# Patient Record
Sex: Male | Born: 1975 | Race: White | Hispanic: No | Marital: Single | Smoking: Current every day smoker
Health system: Southern US, Community
[De-identification: ages and names within clinical notes are randomized; demographics above are authoritative.]

## PROBLEM LIST (undated history)

## (undated) DIAGNOSIS — F1120 Opioid dependence, uncomplicated: Secondary | ICD-10-CM

## (undated) DIAGNOSIS — F988 Other specified behavioral and emotional disorders with onset usually occurring in childhood and adolescence: Secondary | ICD-10-CM

## (undated) DIAGNOSIS — F319 Bipolar disorder, unspecified: Secondary | ICD-10-CM

## (undated) DIAGNOSIS — I10 Essential (primary) hypertension: Secondary | ICD-10-CM

## (undated) DIAGNOSIS — M549 Dorsalgia, unspecified: Secondary | ICD-10-CM

## (undated) DIAGNOSIS — F1190 Opioid use, unspecified, uncomplicated: Secondary | ICD-10-CM

## (undated) DIAGNOSIS — G8929 Other chronic pain: Secondary | ICD-10-CM

## (undated) DIAGNOSIS — F419 Anxiety disorder, unspecified: Secondary | ICD-10-CM

## (undated) DIAGNOSIS — G4730 Sleep apnea, unspecified: Secondary | ICD-10-CM

## (undated) DIAGNOSIS — N200 Calculus of kidney: Secondary | ICD-10-CM

## (undated) HISTORY — PX: BACK SURGERY: SHX140

## (undated) HISTORY — PX: HIP SURGERY: SHX245

## (undated) HISTORY — PX: HAND SURGERY: SHX662

## (undated) HISTORY — PX: OTHER SURGICAL HISTORY: SHX169

---

## 1998-05-12 ENCOUNTER — Emergency Department (HOSPITAL_COMMUNITY): Admission: EM | Admit: 1998-05-12 | Discharge: 1998-05-12 | Payer: Self-pay | Admitting: Emergency Medicine

## 1999-01-19 ENCOUNTER — Emergency Department (HOSPITAL_COMMUNITY): Admission: EM | Admit: 1999-01-19 | Discharge: 1999-01-19 | Payer: Self-pay

## 1999-01-28 ENCOUNTER — Emergency Department (HOSPITAL_COMMUNITY): Admission: EM | Admit: 1999-01-28 | Discharge: 1999-01-28 | Payer: Self-pay | Admitting: Emergency Medicine

## 1999-09-25 ENCOUNTER — Emergency Department (HOSPITAL_COMMUNITY): Admission: EM | Admit: 1999-09-25 | Discharge: 1999-09-26 | Payer: Self-pay | Admitting: Emergency Medicine

## 1999-09-26 ENCOUNTER — Encounter: Payer: Self-pay | Admitting: Emergency Medicine

## 1999-10-22 ENCOUNTER — Emergency Department (HOSPITAL_COMMUNITY): Admission: EM | Admit: 1999-10-22 | Discharge: 1999-10-22 | Payer: Self-pay | Admitting: Emergency Medicine

## 1999-12-27 ENCOUNTER — Emergency Department (HOSPITAL_COMMUNITY): Admission: EM | Admit: 1999-12-27 | Discharge: 1999-12-27 | Payer: Self-pay | Admitting: Emergency Medicine

## 2000-01-30 ENCOUNTER — Emergency Department (HOSPITAL_COMMUNITY): Admission: EM | Admit: 2000-01-30 | Discharge: 2000-01-30 | Payer: Self-pay | Admitting: *Deleted

## 2000-05-21 ENCOUNTER — Emergency Department (HOSPITAL_COMMUNITY): Admission: EM | Admit: 2000-05-21 | Discharge: 2000-05-21 | Payer: Self-pay | Admitting: Emergency Medicine

## 2000-12-10 HISTORY — PX: OTHER SURGICAL HISTORY: SHX169

## 2001-01-19 ENCOUNTER — Emergency Department (HOSPITAL_COMMUNITY): Admission: EM | Admit: 2001-01-19 | Discharge: 2001-01-19 | Payer: Self-pay | Admitting: *Deleted

## 2001-06-07 ENCOUNTER — Emergency Department (HOSPITAL_COMMUNITY): Admission: EM | Admit: 2001-06-07 | Discharge: 2001-06-07 | Payer: Self-pay | Admitting: Emergency Medicine

## 2001-06-07 ENCOUNTER — Encounter: Payer: Self-pay | Admitting: Emergency Medicine

## 2001-07-18 ENCOUNTER — Encounter: Payer: Self-pay | Admitting: Emergency Medicine

## 2001-07-18 ENCOUNTER — Emergency Department (HOSPITAL_COMMUNITY): Admission: EM | Admit: 2001-07-18 | Discharge: 2001-07-18 | Payer: Self-pay | Admitting: Emergency Medicine

## 2001-10-26 ENCOUNTER — Emergency Department (HOSPITAL_COMMUNITY): Admission: EM | Admit: 2001-10-26 | Discharge: 2001-10-26 | Payer: Self-pay | Admitting: Emergency Medicine

## 2001-10-26 ENCOUNTER — Encounter: Payer: Self-pay | Admitting: Emergency Medicine

## 2001-10-28 ENCOUNTER — Emergency Department (HOSPITAL_COMMUNITY): Admission: EM | Admit: 2001-10-28 | Discharge: 2001-10-28 | Payer: Self-pay | Admitting: Emergency Medicine

## 2001-11-14 ENCOUNTER — Encounter: Payer: Self-pay | Admitting: Emergency Medicine

## 2001-11-14 ENCOUNTER — Emergency Department (HOSPITAL_COMMUNITY): Admission: EM | Admit: 2001-11-14 | Discharge: 2001-11-14 | Payer: Self-pay | Admitting: Emergency Medicine

## 2002-01-14 ENCOUNTER — Emergency Department (HOSPITAL_COMMUNITY): Admission: EM | Admit: 2002-01-14 | Discharge: 2002-01-14 | Payer: Self-pay | Admitting: Emergency Medicine

## 2002-09-29 ENCOUNTER — Ambulatory Visit (HOSPITAL_COMMUNITY): Admission: RE | Admit: 2002-09-29 | Discharge: 2002-09-29 | Payer: Self-pay | Admitting: Orthopedic Surgery

## 2002-09-29 ENCOUNTER — Encounter: Payer: Self-pay | Admitting: Orthopedic Surgery

## 2003-05-06 ENCOUNTER — Emergency Department (HOSPITAL_COMMUNITY): Admission: EM | Admit: 2003-05-06 | Discharge: 2003-05-06 | Payer: Self-pay | Admitting: Emergency Medicine

## 2003-05-07 ENCOUNTER — Emergency Department (HOSPITAL_COMMUNITY): Admission: EM | Admit: 2003-05-07 | Discharge: 2003-05-07 | Payer: Self-pay | Admitting: Emergency Medicine

## 2003-05-07 ENCOUNTER — Encounter: Payer: Self-pay | Admitting: Emergency Medicine

## 2003-05-09 ENCOUNTER — Emergency Department (HOSPITAL_COMMUNITY): Admission: EM | Admit: 2003-05-09 | Discharge: 2003-05-09 | Payer: Self-pay | Admitting: Emergency Medicine

## 2003-06-01 ENCOUNTER — Encounter: Payer: Self-pay | Admitting: Emergency Medicine

## 2003-06-01 ENCOUNTER — Emergency Department (HOSPITAL_COMMUNITY): Admission: EM | Admit: 2003-06-01 | Discharge: 2003-06-01 | Payer: Self-pay | Admitting: Emergency Medicine

## 2003-06-02 ENCOUNTER — Encounter: Payer: Self-pay | Admitting: Emergency Medicine

## 2003-06-02 ENCOUNTER — Inpatient Hospital Stay (HOSPITAL_COMMUNITY): Admission: EM | Admit: 2003-06-02 | Discharge: 2003-06-02 | Payer: Self-pay | Admitting: Emergency Medicine

## 2003-06-02 ENCOUNTER — Encounter: Payer: Self-pay | Admitting: Neurology

## 2003-09-21 ENCOUNTER — Encounter: Payer: Self-pay | Admitting: Emergency Medicine

## 2003-09-21 ENCOUNTER — Emergency Department (HOSPITAL_COMMUNITY): Admission: EM | Admit: 2003-09-21 | Discharge: 2003-09-21 | Payer: Self-pay | Admitting: Emergency Medicine

## 2003-10-12 ENCOUNTER — Emergency Department (HOSPITAL_COMMUNITY): Admission: EM | Admit: 2003-10-12 | Discharge: 2003-10-13 | Payer: Self-pay | Admitting: Emergency Medicine

## 2003-11-21 ENCOUNTER — Inpatient Hospital Stay (HOSPITAL_COMMUNITY): Admission: EM | Admit: 2003-11-21 | Discharge: 2003-11-22 | Payer: Self-pay | Admitting: Emergency Medicine

## 2004-01-16 ENCOUNTER — Emergency Department (HOSPITAL_COMMUNITY): Admission: EM | Admit: 2004-01-16 | Discharge: 2004-01-17 | Payer: Self-pay | Admitting: Emergency Medicine

## 2004-02-18 ENCOUNTER — Emergency Department (HOSPITAL_COMMUNITY): Admission: EM | Admit: 2004-02-18 | Discharge: 2004-02-19 | Payer: Self-pay | Admitting: Emergency Medicine

## 2004-02-26 ENCOUNTER — Emergency Department (HOSPITAL_COMMUNITY): Admission: EM | Admit: 2004-02-26 | Discharge: 2004-02-26 | Payer: Self-pay | Admitting: Emergency Medicine

## 2004-03-03 ENCOUNTER — Emergency Department (HOSPITAL_COMMUNITY): Admission: EM | Admit: 2004-03-03 | Discharge: 2004-03-03 | Payer: Self-pay | Admitting: Emergency Medicine

## 2004-03-04 ENCOUNTER — Emergency Department (HOSPITAL_COMMUNITY): Admission: EM | Admit: 2004-03-04 | Discharge: 2004-03-05 | Payer: Self-pay | Admitting: Emergency Medicine

## 2004-03-06 ENCOUNTER — Emergency Department (HOSPITAL_COMMUNITY): Admission: EM | Admit: 2004-03-06 | Discharge: 2004-03-07 | Payer: Self-pay | Admitting: Emergency Medicine

## 2004-03-09 ENCOUNTER — Ambulatory Visit (HOSPITAL_COMMUNITY): Admission: RE | Admit: 2004-03-09 | Discharge: 2004-03-09 | Payer: Self-pay | Admitting: Urology

## 2004-03-15 ENCOUNTER — Ambulatory Visit (HOSPITAL_COMMUNITY): Admission: RE | Admit: 2004-03-15 | Discharge: 2004-03-15 | Payer: Self-pay | Admitting: Urology

## 2004-03-25 ENCOUNTER — Emergency Department (HOSPITAL_COMMUNITY): Admission: EM | Admit: 2004-03-25 | Discharge: 2004-03-25 | Payer: Self-pay | Admitting: Emergency Medicine

## 2004-03-28 ENCOUNTER — Emergency Department (HOSPITAL_COMMUNITY): Admission: EM | Admit: 2004-03-28 | Discharge: 2004-03-28 | Payer: Self-pay | Admitting: Emergency Medicine

## 2004-04-22 ENCOUNTER — Emergency Department (HOSPITAL_COMMUNITY): Admission: EM | Admit: 2004-04-22 | Discharge: 2004-04-22 | Payer: Self-pay | Admitting: Emergency Medicine

## 2004-04-25 ENCOUNTER — Emergency Department (HOSPITAL_COMMUNITY): Admission: EM | Admit: 2004-04-25 | Discharge: 2004-04-25 | Payer: Self-pay | Admitting: Emergency Medicine

## 2004-04-30 ENCOUNTER — Emergency Department (HOSPITAL_COMMUNITY): Admission: EM | Admit: 2004-04-30 | Discharge: 2004-04-30 | Payer: Self-pay | Admitting: Emergency Medicine

## 2004-05-25 ENCOUNTER — Emergency Department (HOSPITAL_COMMUNITY): Admission: EM | Admit: 2004-05-25 | Discharge: 2004-05-25 | Payer: Self-pay | Admitting: Emergency Medicine

## 2004-06-01 ENCOUNTER — Encounter: Admission: RE | Admit: 2004-06-01 | Discharge: 2004-06-01 | Payer: Self-pay | Admitting: Orthopaedic Surgery

## 2004-07-13 ENCOUNTER — Inpatient Hospital Stay (HOSPITAL_COMMUNITY): Admission: RE | Admit: 2004-07-13 | Discharge: 2004-07-19 | Payer: Self-pay | Admitting: Orthopaedic Surgery

## 2004-07-23 ENCOUNTER — Emergency Department (HOSPITAL_COMMUNITY): Admission: EM | Admit: 2004-07-23 | Discharge: 2004-07-23 | Payer: Self-pay

## 2004-07-23 ENCOUNTER — Emergency Department (HOSPITAL_COMMUNITY): Admission: EM | Admit: 2004-07-23 | Discharge: 2004-07-23 | Payer: Self-pay | Admitting: Emergency Medicine

## 2004-08-09 ENCOUNTER — Emergency Department (HOSPITAL_COMMUNITY): Admission: EM | Admit: 2004-08-09 | Discharge: 2004-08-09 | Payer: Self-pay | Admitting: Emergency Medicine

## 2004-09-21 ENCOUNTER — Inpatient Hospital Stay (HOSPITAL_COMMUNITY): Admission: EM | Admit: 2004-09-21 | Discharge: 2004-09-22 | Payer: Self-pay | Admitting: Emergency Medicine

## 2004-09-25 ENCOUNTER — Emergency Department: Payer: Self-pay | Admitting: General Practice

## 2004-10-05 ENCOUNTER — Encounter: Admission: RE | Admit: 2004-10-05 | Discharge: 2004-10-05 | Payer: Self-pay | Admitting: Thoracic Surgery

## 2004-10-21 ENCOUNTER — Emergency Department (HOSPITAL_COMMUNITY): Admission: EM | Admit: 2004-10-21 | Discharge: 2004-10-22 | Payer: Self-pay | Admitting: Family Medicine

## 2004-10-22 ENCOUNTER — Emergency Department (HOSPITAL_COMMUNITY): Admission: EM | Admit: 2004-10-22 | Discharge: 2004-10-22 | Payer: Self-pay | Admitting: Family Medicine

## 2004-11-02 ENCOUNTER — Emergency Department (HOSPITAL_COMMUNITY): Admission: EM | Admit: 2004-11-02 | Discharge: 2004-11-02 | Payer: Self-pay | Admitting: Emergency Medicine

## 2004-11-06 ENCOUNTER — Emergency Department (HOSPITAL_COMMUNITY): Admission: EM | Admit: 2004-11-06 | Discharge: 2004-11-06 | Payer: Self-pay | Admitting: Emergency Medicine

## 2004-11-06 ENCOUNTER — Emergency Department: Payer: Self-pay | Admitting: Emergency Medicine

## 2004-11-07 ENCOUNTER — Emergency Department: Payer: Self-pay | Admitting: Unknown Physician Specialty

## 2004-11-20 ENCOUNTER — Emergency Department (HOSPITAL_COMMUNITY): Admission: EM | Admit: 2004-11-20 | Discharge: 2004-11-20 | Payer: Self-pay | Admitting: Emergency Medicine

## 2004-12-16 ENCOUNTER — Emergency Department (HOSPITAL_COMMUNITY): Admission: EM | Admit: 2004-12-16 | Discharge: 2004-12-16 | Payer: Self-pay | Admitting: Emergency Medicine

## 2004-12-20 ENCOUNTER — Emergency Department: Payer: Self-pay | Admitting: General Practice

## 2005-01-08 ENCOUNTER — Ambulatory Visit (HOSPITAL_COMMUNITY): Admission: RE | Admit: 2005-01-08 | Discharge: 2005-01-08 | Payer: Self-pay | Admitting: Family Medicine

## 2005-01-08 ENCOUNTER — Emergency Department (HOSPITAL_COMMUNITY): Admission: EM | Admit: 2005-01-08 | Discharge: 2005-01-08 | Payer: Self-pay | Admitting: Family Medicine

## 2005-01-16 ENCOUNTER — Emergency Department (HOSPITAL_COMMUNITY): Admission: EM | Admit: 2005-01-16 | Discharge: 2005-01-16 | Payer: Self-pay | Admitting: Emergency Medicine

## 2005-02-05 ENCOUNTER — Emergency Department (HOSPITAL_COMMUNITY): Admission: EM | Admit: 2005-02-05 | Discharge: 2005-02-05 | Payer: Self-pay | Admitting: Emergency Medicine

## 2005-02-16 ENCOUNTER — Encounter: Admission: RE | Admit: 2005-02-16 | Discharge: 2005-05-08 | Payer: Self-pay | Admitting: Anesthesiology

## 2005-02-20 ENCOUNTER — Ambulatory Visit: Payer: Self-pay | Admitting: Anesthesiology

## 2005-02-22 ENCOUNTER — Emergency Department (HOSPITAL_COMMUNITY): Admission: EM | Admit: 2005-02-22 | Discharge: 2005-02-22 | Payer: Self-pay | Admitting: Family Medicine

## 2005-03-21 ENCOUNTER — Ambulatory Visit (HOSPITAL_BASED_OUTPATIENT_CLINIC_OR_DEPARTMENT_OTHER): Admission: RE | Admit: 2005-03-21 | Discharge: 2005-03-21 | Payer: Self-pay | Admitting: Orthopedic Surgery

## 2005-03-24 ENCOUNTER — Emergency Department: Payer: Self-pay | Admitting: Emergency Medicine

## 2005-04-03 ENCOUNTER — Emergency Department: Payer: Self-pay | Admitting: Emergency Medicine

## 2005-04-06 ENCOUNTER — Emergency Department: Payer: Self-pay | Admitting: Emergency Medicine

## 2005-05-08 ENCOUNTER — Encounter
Admission: RE | Admit: 2005-05-08 | Discharge: 2005-08-06 | Payer: Self-pay | Admitting: Physical Medicine and Rehabilitation

## 2005-05-09 ENCOUNTER — Ambulatory Visit: Payer: Self-pay | Admitting: Physical Medicine and Rehabilitation

## 2005-06-01 ENCOUNTER — Emergency Department: Payer: Self-pay | Admitting: Emergency Medicine

## 2005-06-10 ENCOUNTER — Emergency Department: Payer: Self-pay | Admitting: Emergency Medicine

## 2005-06-11 ENCOUNTER — Emergency Department: Payer: Self-pay | Admitting: Emergency Medicine

## 2005-06-12 ENCOUNTER — Emergency Department: Payer: Self-pay | Admitting: Unknown Physician Specialty

## 2005-06-13 ENCOUNTER — Inpatient Hospital Stay: Payer: Self-pay | Admitting: Internal Medicine

## 2005-06-21 ENCOUNTER — Emergency Department: Payer: Self-pay | Admitting: General Practice

## 2005-07-05 ENCOUNTER — Emergency Department (HOSPITAL_COMMUNITY): Admission: EM | Admit: 2005-07-05 | Discharge: 2005-07-05 | Payer: Self-pay | Admitting: Emergency Medicine

## 2005-09-20 ENCOUNTER — Ambulatory Visit: Payer: Self-pay | Admitting: Nurse Practitioner

## 2005-09-20 ENCOUNTER — Encounter (INDEPENDENT_AMBULATORY_CARE_PROVIDER_SITE_OTHER): Payer: Self-pay | Admitting: *Deleted

## 2005-09-20 ENCOUNTER — Other Ambulatory Visit: Admission: RE | Admit: 2005-09-20 | Discharge: 2005-09-20 | Payer: Self-pay | Admitting: Nurse Practitioner

## 2005-09-24 ENCOUNTER — Ambulatory Visit: Payer: Self-pay | Admitting: Nurse Practitioner

## 2005-10-04 ENCOUNTER — Ambulatory Visit: Payer: Self-pay | Admitting: Nurse Practitioner

## 2005-10-04 ENCOUNTER — Ambulatory Visit (HOSPITAL_COMMUNITY): Admission: RE | Admit: 2005-10-04 | Discharge: 2005-10-04 | Payer: Self-pay | Admitting: Internal Medicine

## 2005-10-06 ENCOUNTER — Emergency Department: Payer: Self-pay | Admitting: Emergency Medicine

## 2005-11-03 ENCOUNTER — Emergency Department: Payer: Self-pay | Admitting: Internal Medicine

## 2005-11-10 ENCOUNTER — Emergency Department: Payer: Self-pay | Admitting: Emergency Medicine

## 2005-11-15 ENCOUNTER — Emergency Department (HOSPITAL_COMMUNITY): Admission: EM | Admit: 2005-11-15 | Discharge: 2005-11-15 | Payer: Self-pay | Admitting: Emergency Medicine

## 2005-11-18 ENCOUNTER — Emergency Department (HOSPITAL_COMMUNITY): Admission: EM | Admit: 2005-11-18 | Discharge: 2005-11-18 | Payer: Self-pay | Admitting: Emergency Medicine

## 2005-11-21 ENCOUNTER — Emergency Department: Payer: Self-pay | Admitting: Emergency Medicine

## 2005-12-03 ENCOUNTER — Emergency Department (HOSPITAL_COMMUNITY): Admission: EM | Admit: 2005-12-03 | Discharge: 2005-12-03 | Payer: Self-pay | Admitting: Emergency Medicine

## 2005-12-15 ENCOUNTER — Emergency Department: Payer: Self-pay | Admitting: Emergency Medicine

## 2005-12-16 ENCOUNTER — Emergency Department: Payer: Self-pay | Admitting: Emergency Medicine

## 2005-12-16 ENCOUNTER — Emergency Department (HOSPITAL_COMMUNITY): Admission: EM | Admit: 2005-12-16 | Discharge: 2005-12-16 | Payer: Self-pay | Admitting: Emergency Medicine

## 2005-12-17 ENCOUNTER — Ambulatory Visit: Payer: Self-pay | Admitting: Nurse Practitioner

## 2005-12-21 ENCOUNTER — Emergency Department: Payer: Self-pay | Admitting: Emergency Medicine

## 2005-12-24 ENCOUNTER — Ambulatory Visit: Payer: Self-pay | Admitting: Nurse Practitioner

## 2005-12-31 ENCOUNTER — Emergency Department (HOSPITAL_COMMUNITY): Admission: EM | Admit: 2005-12-31 | Discharge: 2005-12-31 | Payer: Self-pay | Admitting: Emergency Medicine

## 2005-12-31 ENCOUNTER — Ambulatory Visit (HOSPITAL_COMMUNITY): Admission: RE | Admit: 2005-12-31 | Discharge: 2005-12-31 | Payer: Self-pay | Admitting: Internal Medicine

## 2006-01-02 ENCOUNTER — Ambulatory Visit: Payer: Self-pay | Admitting: Nurse Practitioner

## 2006-01-14 ENCOUNTER — Emergency Department: Payer: Self-pay | Admitting: Unknown Physician Specialty

## 2006-01-21 ENCOUNTER — Ambulatory Visit: Payer: Self-pay | Admitting: Nurse Practitioner

## 2006-01-25 ENCOUNTER — Ambulatory Visit: Payer: Self-pay | Admitting: Nurse Practitioner

## 2006-02-12 ENCOUNTER — Emergency Department: Payer: Self-pay | Admitting: Emergency Medicine

## 2006-02-13 ENCOUNTER — Emergency Department (HOSPITAL_COMMUNITY): Admission: EM | Admit: 2006-02-13 | Discharge: 2006-02-13 | Payer: Self-pay | Admitting: Emergency Medicine

## 2006-02-14 ENCOUNTER — Emergency Department: Payer: Self-pay | Admitting: Emergency Medicine

## 2006-02-27 ENCOUNTER — Ambulatory Visit: Payer: Self-pay | Admitting: Nurse Practitioner

## 2006-03-07 ENCOUNTER — Ambulatory Visit: Payer: Self-pay | Admitting: Nurse Practitioner

## 2006-05-22 ENCOUNTER — Emergency Department: Payer: Self-pay | Admitting: General Practice

## 2006-06-03 ENCOUNTER — Emergency Department: Payer: Self-pay | Admitting: Emergency Medicine

## 2006-08-13 ENCOUNTER — Emergency Department: Payer: Self-pay | Admitting: Emergency Medicine

## 2006-08-13 ENCOUNTER — Other Ambulatory Visit: Payer: Self-pay

## 2006-09-16 ENCOUNTER — Emergency Department (HOSPITAL_COMMUNITY): Admission: EM | Admit: 2006-09-16 | Discharge: 2006-09-16 | Payer: Self-pay | Admitting: Emergency Medicine

## 2006-11-27 ENCOUNTER — Emergency Department (HOSPITAL_COMMUNITY): Admission: EM | Admit: 2006-11-27 | Discharge: 2006-11-27 | Payer: Self-pay | Admitting: Emergency Medicine

## 2008-04-03 ENCOUNTER — Encounter: Admission: RE | Admit: 2008-04-03 | Discharge: 2008-04-03 | Payer: Self-pay | Admitting: Orthopaedic Surgery

## 2008-08-07 ENCOUNTER — Emergency Department (HOSPITAL_COMMUNITY): Admission: EM | Admit: 2008-08-07 | Discharge: 2008-08-07 | Payer: Self-pay | Admitting: Emergency Medicine

## 2008-08-23 ENCOUNTER — Encounter: Admission: RE | Admit: 2008-08-23 | Discharge: 2008-08-23 | Payer: Self-pay | Admitting: Orthopaedic Surgery

## 2009-04-12 ENCOUNTER — Encounter: Admission: RE | Admit: 2009-04-12 | Discharge: 2009-04-12 | Payer: Self-pay | Admitting: Orthopaedic Surgery

## 2010-01-23 ENCOUNTER — Encounter: Admission: RE | Admit: 2010-01-23 | Discharge: 2010-01-23 | Payer: Self-pay | Admitting: Orthopaedic Surgery

## 2010-05-31 ENCOUNTER — Encounter: Admission: RE | Admit: 2010-05-31 | Discharge: 2010-05-31 | Payer: Self-pay | Admitting: Orthopaedic Surgery

## 2010-09-28 ENCOUNTER — Emergency Department (HOSPITAL_COMMUNITY): Admission: EM | Admit: 2010-09-28 | Discharge: 2010-09-28 | Payer: Self-pay | Admitting: Emergency Medicine

## 2010-10-08 ENCOUNTER — Emergency Department (HOSPITAL_COMMUNITY): Admission: EM | Admit: 2010-10-08 | Discharge: 2010-10-08 | Payer: Self-pay | Admitting: Emergency Medicine

## 2010-12-30 ENCOUNTER — Encounter: Payer: Self-pay | Admitting: Thoracic Surgery

## 2010-12-30 ENCOUNTER — Encounter: Payer: Self-pay | Admitting: Family Medicine

## 2010-12-30 ENCOUNTER — Encounter: Payer: Self-pay | Admitting: Specialist

## 2010-12-31 ENCOUNTER — Encounter: Payer: Self-pay | Admitting: Orthopaedic Surgery

## 2011-01-01 ENCOUNTER — Encounter: Payer: Self-pay | Admitting: Orthopaedic Surgery

## 2011-02-21 LAB — URINALYSIS, ROUTINE W REFLEX MICROSCOPIC
Nitrite: NEGATIVE
Protein, ur: 30 mg/dL — AB
Urobilinogen, UA: 0.2 mg/dL (ref 0.0–1.0)

## 2011-02-21 LAB — URINE MICROSCOPIC-ADD ON

## 2011-02-21 LAB — POCT I-STAT, CHEM 8
BUN: 11 mg/dL (ref 6–23)
Chloride: 104 mEq/L (ref 96–112)
Sodium: 139 mEq/L (ref 135–145)

## 2011-03-03 ENCOUNTER — Emergency Department (HOSPITAL_COMMUNITY)
Admission: EM | Admit: 2011-03-03 | Discharge: 2011-03-03 | Disposition: A | Discharge status: Home / Self Care | Payer: Medicare Other | Attending: Emergency Medicine | Admitting: Emergency Medicine

## 2011-03-03 ENCOUNTER — Emergency Department (HOSPITAL_COMMUNITY): Payer: Medicare Other

## 2011-03-03 DIAGNOSIS — R0602 Shortness of breath: Secondary | ICD-10-CM | POA: Insufficient documentation

## 2011-03-03 DIAGNOSIS — IMO0002 Reserved for concepts with insufficient information to code with codable children: Secondary | ICD-10-CM | POA: Insufficient documentation

## 2011-03-03 DIAGNOSIS — R071 Chest pain on breathing: Secondary | ICD-10-CM | POA: Insufficient documentation

## 2011-03-03 DIAGNOSIS — G8929 Other chronic pain: Secondary | ICD-10-CM | POA: Insufficient documentation

## 2011-03-03 LAB — POCT CARDIAC MARKERS
CKMB, poc: <1 ng/mL — ABNORMAL LOW (ref 1.0–8.0)
Myoglobin, poc: 67.3 ng/mL (ref 12–200)
Troponin i, poc: <0.05 ng/mL (ref 0.00–0.09)

## 2011-03-03 LAB — POCT I-STAT, CHEM 8
BUN: 11 mg/dL (ref 6–23)
Creatinine, Ser: 1 mg/dL (ref 0.4–1.5)
Potassium: 4.1 mEq/L (ref 3.5–5.1)
Sodium: 140 mEq/L (ref 135–145)

## 2011-03-03 MED ORDER — IOHEXOL 300 MG/ML  SOLN
100.0000 mL | Freq: Once | INTRAMUSCULAR | Status: AC | PRN
  Administered 2011-03-03: 100 mL via INTRAVENOUS

## 2011-04-08 ENCOUNTER — Emergency Department (HOSPITAL_COMMUNITY): Payer: Medicare Other

## 2011-04-08 ENCOUNTER — Emergency Department (HOSPITAL_COMMUNITY)
Admission: EM | Admit: 2011-04-08 | Discharge: 2011-04-08 | Disposition: A | Discharge status: Home / Self Care | Payer: Medicare Other | Attending: Emergency Medicine | Admitting: Emergency Medicine

## 2011-04-08 DIAGNOSIS — Z87442 Personal history of urinary calculi: Secondary | ICD-10-CM | POA: Insufficient documentation

## 2011-04-08 DIAGNOSIS — R319 Hematuria, unspecified: Secondary | ICD-10-CM | POA: Insufficient documentation

## 2011-04-08 DIAGNOSIS — R109 Unspecified abdominal pain: Secondary | ICD-10-CM | POA: Insufficient documentation

## 2011-04-08 DIAGNOSIS — IMO0002 Reserved for concepts with insufficient information to code with codable children: Secondary | ICD-10-CM | POA: Insufficient documentation

## 2011-04-08 LAB — DIFFERENTIAL
Basophils Absolute: 0 10*3/uL (ref 0.0–0.1)
Basophils Relative: 0 % (ref 0–1)
Monocytes Absolute: 0.9 10*3/uL (ref 0.1–1.0)
Neutro Abs: 6.6 10*3/uL (ref 1.7–7.7)
Neutrophils Relative %: 66 % (ref 43–77)

## 2011-04-08 LAB — POCT I-STAT, CHEM 8
HCT: 49 % (ref 39.0–52.0)
Hemoglobin: 16.7 g/dL (ref 13.0–17.0)
Sodium: 140 mEq/L (ref 135–145)
TCO2: 28 mmol/L (ref 0–100)

## 2011-04-08 LAB — CBC
Hemoglobin: 15.1 g/dL (ref 13.0–17.0)
MCHC: 33.8 g/dL (ref 30.0–36.0)
RBC: 5.11 MIL/uL (ref 4.22–5.81)

## 2011-04-08 LAB — URINALYSIS, ROUTINE W REFLEX MICROSCOPIC
Glucose, UA: NEGATIVE mg/dL
Leukocytes, UA: NEGATIVE
Protein, ur: NEGATIVE mg/dL

## 2011-04-08 LAB — URINE MICROSCOPIC-ADD ON
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
## 2011-04-27 NOTE — Op Note (Signed)
NAMEAXIL, COPEMAN              ACCOUNT NO.:  [id number]   MEDICAL RECORD NO.:  [id number]          PATIENT TYPE:  AMB   LOCATION:  DSC                          FACILITY:  [facility]   PHYSICIAN:  Cindee Salt, M.D.       DATE OF BIRTH:  18-Jan-1976   DATE OF PROCEDURE:  03/21/2005  DATE OF DISCHARGE:                                 OPERATIVE REPORT   PREOPERATIVE DIAGNOSIS:  Degenerative arthritis, metacarpophalangeal joint,  status post fracture, left thumb.   POSTOPERATIVE DIAGNOSIS:  Degenerative arthritis, metacarpophalangeal joint,  status post fracture, left thumb.   OPERATION:  Fusion, metacarpophalangeal joint, left thumb.   SURGEON:  Cindee Salt, M.D.   ASSISTANT:  Carolyne Fiscal R.N.   ANESTHESIA:  General.   HISTORY:  The patient is 35 year old male with a history of injury to the  metacarpophalangeal joint of his thumb.  He was treated conservatively.  He has had continued pain.  Bone scan shows  degenerative changes.  He has not responded to conservative treatment.   PROCEDURE:  The patient is brought to the operating room, where a general  anesthetic was carried out without difficulty.  He was prepped using  DuraPrep, supine position, the left arm free.  The limb was exsanguinated  with an Esmarch bandage, tourniquet placed on the arm was inflated to 250  mmHg.  A longitudinal incision was made over the metacarpophalangeal joint,  carried down through subcutaneous tissue.  Bleeders were electrocauterized.  The extensor tendons were split between the extensor pollicis longus,  extensor pollicis brevis, the joint capsule opened.  The joint was  inspected.  Degenerative changes were present with total loss of cartilage  in three areas on the joint, both on the metacarpal head and the proximal  portion of the proximal phalanx.  The collateral ligaments were then incised  at their insertion in the metacarpal head.  This allowed joint to be opened.  A rongeur was then used to  remove the cartilage and subchondral bone from  the metacarpal head.  Drill holes were placed for removal of the cartilage,  subchondral bone off from the proximal portion of the proximal phalanx with  a sharp osteotome.  These were connected.  A rongeur was then used to remove  this.  This was then shaped.  A guidewire was then placed for a mini-extract  screw.  This was inserted under direct vision through the metacarpal head  until was placed where adequate positioning was accounted for.  This was  then passed into the proximal phalanx.  This was found to have penetrated  the volar cortex.  This was slightly flexed to a greater position after  retrieval of the pin.  This was then driven and found to lie in the central  aspect of the middle phalanx on both AP and lateral directions.  A 24 mm  mini-extract screw was then placed, firmly compressing the  metacarpophalangeal joint.  The wound was irrigated.  X-rays confirmed  positioning of the extract screw in both AP and lateral directions.  The  capsule was closed with figure-of-eight 4-0 Vicryl, the  extensor tendon with  figure-of-eight 4-0 Vicryl, and skin with a subcuticular 4-0 Monocryl  suture.  Steri-Strips were applied, a sterile compressive dressing, a thumb  spica splint applied.  The patient tolerated the procedure well and was  taken to the recovery room for observation in satisfactory condition.  He is  discharged home to return to the [facility] in one week on  Percocet.      GK/MEDQ  D:  03/21/2005  T:  03/21/2005  Job:  [id number]

## 2011-04-27 NOTE — H&P (Signed)
NAME:  Jonathan Perez, Jonathan Perez                        ACCOUNT NO.:  [id number]   MEDICAL RECORD NO.:  [id number]                   PATIENT TYPE:  INP   LOCATION:  0456                                 FACILITY:  [facility]   PHYSICIAN:  Jene Every, M.D.                 DATE OF BIRTH:  Mar 28, 1976   DATE OF ADMISSION:  11/21/2003  DATE OF DISCHARGE:                                HISTORY & PHYSICAL   CHIEF COMPLAINT:  Low back pain.   HISTORY:  This is a 35 year old male who fell approximately 10 feet off a  ladder after trying to retrieve a cat and landed on his buttock.  He had  some reported acute back pain, no lower extremity radicular pain.  Presented  to the emergency room where he was found to have a compression fracture of  50% of the vertebral body.  He again reported no neck pain,  numbness/tingling currently in the upper or lower extremities.   PAST MEDICAL HISTORY:  Negative for peptic ulcer disease, diabetes, coronary  artery disease, hypertension.   ALLERGIES:  None.   MEDICATIONS:  None.   PHYSICAL EXAMINATION:  VITAL SIGNS:  He was afebrile with normotensive.  His  respirations were 20 and blood pressure 117/24.  GENERAL:  He is alert and oriented.  HEENT:  Atraumatic, normocephalic.  NECK:  Nontender to the cervical spine.  CHEST:  Nontender to palpation.  ABDOMEN:  Soft, nontender.  Positive bowel sounds.  BACK:  Nontender over the upper thoracic or lower lumbar spine.  PELVIS:  Stable to compression.  NEUROLOGIC:  Peripheral pulses are intact.  __________ V:  He is  neuroflexic.  Sensation is intact to light touch and position.  No  __________ or clonus.  No instability of the hips, knees, and ankles.  No  palpable tenderness in the upper or lower extremities.  RECTAL:  Good sphincter tone.  Good appreciation and sensation is intact.   AP/Lateral radiographs of the lumbar spine demonstrates T12 compression  fracture, no widening of the pedicles, 50% anterior wedge  compression  fracture, no apparent avulsion, no apparent retropulsion.   CT scan of the lumbar spine demonstrates no middle column involvement.  There is no burst fracture noted.   IMPRESSION:  T12 compression fracture anterior column.  No middle column  involvement.  Neurologically intact.  No evidence of ileus.   PLAN:  1. Admit for observation, neurologic examination.  Watch for ileus.  Pain     control.  2. Maintain in TLSO brace followed by a radiograph in this brace to rule out     vertebral fracture.                                               Jene Every, M.D.  JB/MEDQ  D:  11/22/2003  T:  11/22/2003  Job:  [id number]
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
## 2011-04-27 NOTE — Discharge Summary (Signed)
NAME:  [name], [name]                        ACCOUNT NO.:  [id number]   MEDICAL RECORD NO.:  [id number]                   PATIENT TYPE:  INP   LOCATION:  0456                                 FACILITY:  [facility]   PHYSICIAN:  Javier Docker, M.D.              DATE OF BIRTH:  [birth year]   DATE OF ADMISSION:  11/21/2003  DATE OF DISCHARGE:  11/22/2003                                 DISCHARGE SUMMARY   DISCHARGE DIAGNOSIS:  T12 compression fracture.   DISCHARGE DIAGNOSES:  1. T12 compression fracture.  2. Status post admission for observation.   PROCEDURE:  None.   CONSULTATIONS:  None.   PHYSICAL THERAPY/OCCUPATIONAL THERAPY:  [employer].   HISTORY:  [name] is a 35 year old gentleman.  He fell approximately 10  feet off a ladder trying to retrieve a cat.  The patient landed on his  buttocks and had acute onset of back pain.  Denies any pain in lower  extremities.  The patient then presented to the emergency room where x-rays  revealed compression fracture of 50% to the vertebral body.  He was admitted  for observation with preoperative neurologic examination and ileus  precaution as well as pain control.   LAN DATA:  On admission, CBC with white blood cell count 12.8, hemoglobin  17.6, hematocrit 49.9.  Discharge CBC had a white blood cell count of 8.7,  hemoglobin 17.3, hematocrit 49.2.  Routine chemistries prior to admission  showed sodium 138, potassium 4.2, glucose 90.  Urinalysis done showed large  amount of hemoglobin with 21-50 WBCs seen on hpf.   CT of the thoracic spine showed anterior wedge compression fracture of the  T12 vertebral body with approximately 50% loss of height.  No evidence of  burst fracture or impingement from the spine canal was noted.  Other films  taken in the emergency room of the sacrum and coccyx was negative.  The  pelvis no fractures were noted.  The lumbar spine no abnormalities were seen  on examination. Cervical spine films were  clear.   HOSPITAL COURSE:  The patient was admitted for observation, pain control as  well as periodic neurologic examination.  A CT scan was ordered to verify  there was no compression upon the thecal sac.  [employer] was consulted to  place the patient in a TLSO brace.  Ileus precautions were initiated.  On  admission day #2, the patient was doing well.  He noted mild pain. The pain  was not controlled on p.o. analgesics.  Vital signs were stable.  Neurovascular examination was intact.  He had passed flatus without  difficulty.  It was felt at this point the patient could be discharged home  with the TLSO brace with stool softener and p.o. pain medication.   DISPOSITION:  The patient was discharged home.   ACTIVITY:  The patient can ambulate as tolerated.  He should wear his TLSO  brace at all  times.  He should not bend, twist, or lift.   DISCHARGE MEDICATIONS:  Percocet as needed for pain.   DISCHARGE INSTRUCTIONS:  He was asked to call if he has any uncontrolled  pain, numbness or tingling in the legs or feet or urinary retention.  The  patient also was sent home with a stool softener.   DIET:  As tolerated to include fluid and fiber.   FOLLOWUP:  The patient should have followup with Dr. Shelle Iron in approximately  10-14 days for re-evaluation.   CONDITION ON DISCHARGE:  Stable.     Christine D. Iran Ouch, P.A.-C              Javier Docker, M.D.    CDS/MEDQ  D:  12/08/2003  T:  12/08/2003  Job:  [id number]
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
## 2011-06-06 ENCOUNTER — Emergency Department (HOSPITAL_COMMUNITY)
Admission: EM | Admit: 2011-06-06 | Discharge: 2011-06-07 | Disposition: A | Discharge status: Home / Self Care | Payer: Medicare Other | Attending: Emergency Medicine | Admitting: Emergency Medicine

## 2011-06-06 DIAGNOSIS — G43909 Migraine, unspecified, not intractable, without status migrainosus: Secondary | ICD-10-CM | POA: Insufficient documentation

## 2011-06-06 DIAGNOSIS — R110 Nausea: Secondary | ICD-10-CM | POA: Insufficient documentation

## 2011-06-06 DIAGNOSIS — H53149 Visual discomfort, unspecified: Secondary | ICD-10-CM | POA: Insufficient documentation

## 2011-06-06 DIAGNOSIS — IMO0002 Reserved for concepts with insufficient information to code with codable children: Secondary | ICD-10-CM | POA: Insufficient documentation

## 2011-07-03 ENCOUNTER — Emergency Department (HOSPITAL_COMMUNITY)
Admission: EM | Admit: 2011-07-03 | Discharge: 2011-07-03 | Disposition: A | Discharge status: Home / Self Care | Payer: Medicare Other | Attending: Emergency Medicine | Admitting: Emergency Medicine

## 2011-07-03 DIAGNOSIS — R109 Unspecified abdominal pain: Secondary | ICD-10-CM | POA: Insufficient documentation

## 2011-07-03 DIAGNOSIS — Z79899 Other long term (current) drug therapy: Secondary | ICD-10-CM | POA: Insufficient documentation

## 2011-07-03 DIAGNOSIS — R112 Nausea with vomiting, unspecified: Secondary | ICD-10-CM | POA: Insufficient documentation

## 2011-07-03 DIAGNOSIS — Z9889 Other specified postprocedural states: Secondary | ICD-10-CM | POA: Insufficient documentation

## 2011-07-03 DIAGNOSIS — IMO0002 Reserved for concepts with insufficient information to code with codable children: Secondary | ICD-10-CM | POA: Insufficient documentation

## 2011-07-03 LAB — URINALYSIS, ROUTINE W REFLEX MICROSCOPIC
Bilirubin Urine: NEGATIVE
Ketones, ur: NEGATIVE mg/dL
Nitrite: NEGATIVE
Protein, ur: NEGATIVE mg/dL
Specific Gravity, Urine: 1.02 (ref 1.005–1.030)
Urobilinogen, UA: 1 mg/dL (ref 0.0–1.0)

## 2011-07-03 LAB — RAPID URINE DRUG SCREEN, HOSP PERFORMED
Amphetamines: NOT DETECTED
Cocaine: NOT DETECTED
Opiates: POSITIVE — AB
Tetrahydrocannabinol: POSITIVE — AB

## 2011-07-22 ENCOUNTER — Emergency Department (HOSPITAL_COMMUNITY): Payer: Medicare Other

## 2011-07-22 ENCOUNTER — Emergency Department (HOSPITAL_COMMUNITY)
Admission: EM | Admit: 2011-07-22 | Discharge: 2011-07-22 | Disposition: A | Discharge status: Home / Self Care | Payer: Medicare Other | Attending: Emergency Medicine | Admitting: Emergency Medicine

## 2011-07-22 DIAGNOSIS — R319 Hematuria, unspecified: Secondary | ICD-10-CM | POA: Insufficient documentation

## 2011-07-22 DIAGNOSIS — R110 Nausea: Secondary | ICD-10-CM | POA: Insufficient documentation

## 2011-07-22 DIAGNOSIS — N200 Calculus of kidney: Secondary | ICD-10-CM | POA: Insufficient documentation

## 2011-07-22 DIAGNOSIS — Z87442 Personal history of urinary calculi: Secondary | ICD-10-CM | POA: Insufficient documentation

## 2011-07-22 DIAGNOSIS — N201 Calculus of ureter: Secondary | ICD-10-CM | POA: Insufficient documentation

## 2011-07-22 DIAGNOSIS — IMO0002 Reserved for concepts with insufficient information to code with codable children: Secondary | ICD-10-CM | POA: Insufficient documentation

## 2011-07-22 DIAGNOSIS — R109 Unspecified abdominal pain: Secondary | ICD-10-CM | POA: Insufficient documentation

## 2011-07-22 LAB — URINALYSIS, ROUTINE W REFLEX MICROSCOPIC
Bilirubin Urine: NEGATIVE
Glucose, UA: NEGATIVE mg/dL
Protein, ur: 30 mg/dL — AB
Specific Gravity, Urine: 1.026 (ref 1.005–1.030)
Urobilinogen, UA: 1 mg/dL (ref 0.0–1.0)

## 2011-07-22 LAB — POCT I-STAT, CHEM 8
Creatinine, Ser: 1 mg/dL (ref 0.50–1.35)
Hemoglobin: 15.6 g/dL (ref 13.0–17.0)
Potassium: 4.1 mEq/L (ref 3.5–5.1)
Sodium: 138 mEq/L (ref 135–145)

## 2011-07-22 LAB — URINE MICROSCOPIC-ADD ON

## 2011-09-20 ENCOUNTER — Emergency Department (HOSPITAL_COMMUNITY)
Admission: EM | Admit: 2011-09-20 | Discharge: 2011-09-20 | Disposition: A | Discharge status: Home / Self Care | Payer: Medicare Other | Attending: Emergency Medicine | Admitting: Emergency Medicine

## 2011-09-20 DIAGNOSIS — H9209 Otalgia, unspecified ear: Secondary | ICD-10-CM | POA: Insufficient documentation

## 2011-09-23 ENCOUNTER — Emergency Department (HOSPITAL_COMMUNITY): Payer: Medicare Other

## 2011-09-23 ENCOUNTER — Emergency Department (HOSPITAL_COMMUNITY)
Admission: EM | Admit: 2011-09-23 | Discharge: 2011-09-24 | Disposition: A | Discharge status: Home / Self Care | Payer: Medicare Other | Attending: Emergency Medicine | Admitting: Emergency Medicine

## 2011-09-23 DIAGNOSIS — IMO0002 Reserved for concepts with insufficient information to code with codable children: Secondary | ICD-10-CM | POA: Insufficient documentation

## 2011-09-23 DIAGNOSIS — N509 Disorder of male genital organs, unspecified: Secondary | ICD-10-CM | POA: Insufficient documentation

## 2011-09-23 DIAGNOSIS — Z79899 Other long term (current) drug therapy: Secondary | ICD-10-CM | POA: Insufficient documentation

## 2011-09-23 DIAGNOSIS — R110 Nausea: Secondary | ICD-10-CM | POA: Insufficient documentation

## 2011-09-23 DIAGNOSIS — R319 Hematuria, unspecified: Secondary | ICD-10-CM | POA: Insufficient documentation

## 2011-09-23 DIAGNOSIS — R1031 Right lower quadrant pain: Secondary | ICD-10-CM | POA: Insufficient documentation

## 2011-09-23 DIAGNOSIS — Z981 Arthrodesis status: Secondary | ICD-10-CM | POA: Insufficient documentation

## 2011-09-23 LAB — DIFFERENTIAL
Basophils Absolute: 0 10*3/uL (ref 0.0–0.1)
Basophils Relative: 0 % (ref 0–1)
Eosinophils Absolute: 0.1 10*3/uL (ref 0.0–0.7)
Eosinophils Relative: 1 % (ref 0–5)
Lymphocytes Relative: 18 % (ref 12–46)
Monocytes Absolute: 0.9 10*3/uL (ref 0.1–1.0)

## 2011-09-23 LAB — URINALYSIS, ROUTINE W REFLEX MICROSCOPIC
Leukocytes, UA: NEGATIVE
Nitrite: NEGATIVE
Protein, ur: NEGATIVE mg/dL
Specific Gravity, Urine: 1.026 (ref 1.005–1.030)
Urobilinogen, UA: 0.2 mg/dL (ref 0.0–1.0)

## 2011-09-23 LAB — COMPREHENSIVE METABOLIC PANEL
AST: 25 U/L (ref 0–37)
Albumin: 3.9 g/dL (ref 3.5–5.2)
Alkaline Phosphatase: 83 U/L (ref 39–117)
BUN: 13 mg/dL (ref 6–23)
Chloride: 100 mEq/L (ref 96–112)
Potassium: 4 mEq/L (ref 3.5–5.1)
Sodium: 137 mEq/L (ref 135–145)
Total Bilirubin: 0.5 mg/dL (ref 0.3–1.2)
Total Protein: 7.1 g/dL (ref 6.0–8.3)

## 2011-09-23 LAB — CBC
MCHC: 35 g/dL (ref 30.0–36.0)
Platelets: 272 10*3/uL (ref 150–400)
RDW: 14.1 % (ref 11.5–15.5)
WBC: 13.1 10*3/uL — ABNORMAL HIGH (ref 4.0–10.5)

## 2011-09-23 LAB — URINE MICROSCOPIC-ADD ON

## 2011-09-25 ENCOUNTER — Other Ambulatory Visit: Payer: Self-pay | Admitting: Otolaryngology

## 2011-09-25 DIAGNOSIS — H9209 Otalgia, unspecified ear: Secondary | ICD-10-CM

## 2011-10-03 ENCOUNTER — Ambulatory Visit
Admission: RE | Admit: 2011-10-03 | Discharge: 2011-10-03 | Disposition: A | Discharge status: Home / Self Care | Payer: Medicare Other | Source: Ambulatory Visit | Attending: Otolaryngology | Admitting: Otolaryngology

## 2011-10-03 DIAGNOSIS — H9209 Otalgia, unspecified ear: Secondary | ICD-10-CM

## 2011-12-17 ENCOUNTER — Emergency Department (HOSPITAL_COMMUNITY)
Admission: EM | Admit: 2011-12-17 | Discharge: 2011-12-18 | Disposition: A | Discharge status: Home / Self Care | Payer: Medicare Other | Attending: Emergency Medicine | Admitting: Emergency Medicine

## 2011-12-17 ENCOUNTER — Emergency Department (HOSPITAL_COMMUNITY): Payer: Medicare Other

## 2011-12-17 ENCOUNTER — Encounter: Payer: Self-pay | Admitting: *Deleted

## 2011-12-17 DIAGNOSIS — L98499 Non-pressure chronic ulcer of skin of other sites with unspecified severity: Secondary | ICD-10-CM | POA: Insufficient documentation

## 2011-12-17 DIAGNOSIS — M549 Dorsalgia, unspecified: Secondary | ICD-10-CM | POA: Insufficient documentation

## 2011-12-17 DIAGNOSIS — R51 Headache: Secondary | ICD-10-CM | POA: Insufficient documentation

## 2011-12-17 DIAGNOSIS — R112 Nausea with vomiting, unspecified: Secondary | ICD-10-CM

## 2011-12-17 DIAGNOSIS — R197 Diarrhea, unspecified: Secondary | ICD-10-CM | POA: Insufficient documentation

## 2011-12-17 DIAGNOSIS — R55 Syncope and collapse: Secondary | ICD-10-CM | POA: Insufficient documentation

## 2011-12-17 DIAGNOSIS — R05 Cough: Secondary | ICD-10-CM | POA: Insufficient documentation

## 2011-12-17 DIAGNOSIS — G8929 Other chronic pain: Secondary | ICD-10-CM | POA: Insufficient documentation

## 2011-12-17 DIAGNOSIS — R059 Cough, unspecified: Secondary | ICD-10-CM | POA: Insufficient documentation

## 2011-12-17 HISTORY — DX: Dorsalgia, unspecified: M54.9

## 2011-12-17 HISTORY — DX: Other chronic pain: G89.29

## 2011-12-17 MED ORDER — SODIUM CHLORIDE 0.9 % IV BOLUS (SEPSIS)
1000.0000 mL | Freq: Once | INTRAVENOUS | Status: AC
  Administered 2011-12-18: 1000 mL via INTRAVENOUS

## 2011-12-17 NOTE — ED Provider Notes (Signed)
History     CSN: [id number]  Arrival date & time 12/17/11  2212   First MD Initiated Contact with Patient 12/17/11 2334      Chief Complaint  Patient presents with  . Influenza    (Consider location/radiation/quality/duration/timing/severity/associated sxs/prior treatment) HPI Comments: Patient states that for the past, week.  He's been able to eat or drink anything, as he's had vomiting, diarrhea, cough, denies fever, nasal congestion, sore throat.  He does have a history of chronic back pain for which he takes Percocet and, Neurontin.  He's been unable to take this is a sensitive stomach.  He vomits.  Patient was attempting to change positions at triage and stood from wheelchair passed out falling to the floor, landing on his buttocks and then back.  He was immediately seen was awake, alert.  No loss of consciousness, although he appeared dazed questioning, where he was.  Patient is a 36 y.o. male presenting with flu symptoms. The history is provided by the patient.  Influenza This is a new problem. The current episode started in the past 7 days. The problem occurs constantly. The problem has been gradually worsening. Associated symptoms include coughing, headaches, nausea, vomiting and weakness. Pertinent negatives include no abdominal pain, chest pain, chills, congestion, diaphoresis, fever, numbness, sore throat or urinary symptoms. The symptoms are aggravated by nothing.    Past Medical History  Diagnosis Date  . Chronic back pain     History reviewed. No pertinent past surgical history.  History reviewed. No pertinent family history.  History  Substance Use Topics  . Smoking status: Not on file  . Smokeless tobacco: Not on file  . Alcohol Use:       Review of Systems  Constitutional: Positive for activity change and appetite change. Negative for fever, chills and diaphoresis.  HENT: Negative for congestion, sore throat and rhinorrhea.   Eyes: Negative for visual  disturbance.  Respiratory: Positive for cough and chest tightness. Negative for shortness of breath.   Cardiovascular: Negative for chest pain and leg swelling.  Gastrointestinal: Positive for nausea and vomiting. Negative for abdominal pain and constipation.  Genitourinary: Negative for dysuria and frequency.  Musculoskeletal: Positive for back pain.  Skin: Positive for pallor.  Neurological: Positive for dizziness, weakness and headaches. Negative for tremors, seizures and numbness.  Hematological: Negative.   Psychiatric/Behavioral: Negative.     Allergies  Review of patient's allergies indicates no known allergies.  Home Medications   Current Outpatient Rx  Name Route Sig Dispense Refill  . GABAPENTIN 400 MG PO CAPS Oral Take 400 mg by mouth 4 (four) times daily.      . OXYCODONE HCL 30 MG PO TABS Oral Take 30 mg by mouth every 4 (four) hours as needed.        BP 135/83  Pulse 82  Temp 98 F (36.7 C)  Resp 16  SpO2 98%  Physical Exam  Constitutional: He is oriented to person, place, and time. He appears well-developed and well-nourished.  HENT:  Head: Normocephalic and atraumatic.  Right Ear: External ear normal.  Left Ear: External ear normal.  Eyes: Pupils are equal, round, and reactive to light.  Neck: Normal range of motion.  Cardiovascular: Normal rate.   Pulmonary/Chest: Effort normal.  Abdominal: Soft. Bowel sounds are normal. He exhibits no distension. There is no tenderness.  Musculoskeletal: Normal range of motion. He exhibits no edema.  Neurological: He is alert and oriented to person, place, and time. No cranial nerve deficit.  Skin: Skin is warm and dry. There is pallor.  Psychiatric: He has a normal mood and affect.    ED Course  Procedures (including critical care time)   Labs Reviewed  CBC  DIFFERENTIAL  I-STAT, CHEM 8   No results found.   No diagnosis found.  ED ECG REPORT   Date: 12/18/2011  EKG Time: 12:56 AM  Rate: 55  Rhythm:  normal sinus rhythm,  PVC Borderline Q waves and flipped T wave in  Lead III  Axis: normal   Intervals:none  ST&T Change: none  Narrative Interpretation:none       We'll continue hydration as patient is still orthostatic by pulse     MDM  Will evaluate electrolytes, CBC, Will obtain EKG and head CT scan due to syncopal episode in the ED        Arman Filter, NP 12/17/11 2346  Arman Filter, NP 12/18/11 0058  Arman Filter, NP 12/18/11 0600

## 2011-12-17 NOTE — ED Notes (Signed)
Pt in c/o flu like symptoms since Thursday, cough, congestion, fever, n/v , back pain

## 2011-12-17 NOTE — ED Notes (Signed)
Xray bedside, pt refuses to awaken for xray, will attempt later

## 2011-12-17 NOTE — ED Notes (Signed)
Pt alert, nad, c/o "i dont know", "i never said i had the flu, you all did", " i cant eat, i cant drink", onset last week, resp even unlabored, skin pwd

## 2011-12-17 NOTE — ED Notes (Signed)
Pt requesting blanket and pillow to lay down, pt given blanket, pt decides to lay on floor, instructed pt not to lay on floor, pt states"i can lay where i want"

## 2011-12-18 ENCOUNTER — Other Ambulatory Visit: Payer: Self-pay

## 2011-12-18 LAB — POCT I-STAT, CHEM 8
BUN: 14 mg/dL (ref 6–23)
Calcium, Ion: 1.1 mmol/L — ABNORMAL LOW (ref 1.12–1.32)
Chloride: 105 mEq/L (ref 96–112)
Creatinine, Ser: 1 mg/dL (ref 0.50–1.35)
Glucose, Bld: 94 mg/dL (ref 70–99)
HCT: 51 % (ref 39.0–52.0)
Hemoglobin: 17.3 g/dL — ABNORMAL HIGH (ref 13.0–17.0)
Potassium: 3.6 mEq/L (ref 3.5–5.1)
Sodium: 140 mEq/L (ref 135–145)
TCO2: 25 mmol/L (ref 0–100)

## 2011-12-18 LAB — DIFFERENTIAL
Eosinophils Absolute: 0 10*3/uL (ref 0.0–0.7)
Eosinophils Relative: 0 % (ref 0–5)
Lymphocytes Relative: 19 % (ref 12–46)
Lymphs Abs: 2.5 10*3/uL (ref 0.7–4.0)
Monocytes Absolute: 1.3 10*3/uL — ABNORMAL HIGH (ref 0.1–1.0)

## 2011-12-18 LAB — CBC
HCT: 45.5 % (ref 39.0–52.0)
MCH: 31.3 pg (ref 26.0–34.0)
MCV: 88.9 fL (ref 78.0–100.0)
Platelets: 276 10*3/uL (ref 150–400)
RBC: 5.12 MIL/uL (ref 4.22–5.81)
RDW: 13.6 % (ref 11.5–15.5)
WBC: 13.2 10*3/uL — ABNORMAL HIGH (ref 4.0–10.5)

## 2011-12-18 MED ORDER — SODIUM CHLORIDE 0.9 % IV BOLUS (SEPSIS): 1000.0000 mL | Freq: Once | INTRAVENOUS | Status: DC

## 2011-12-18 MED ORDER — PROMETHAZINE HCL 25 MG RE SUPP
25.0000 mg | Freq: Once | RECTAL | Status: AC
  Administered 2011-12-18: 25 mg via RECTAL
  Filled 2011-12-18: qty 1

## 2011-12-18 MED ORDER — MORPHINE SULFATE 4 MG/ML IJ SOLN
4.0000 mg | Freq: Once | INTRAMUSCULAR | Status: AC
  Administered 2011-12-18: 4 mg via INTRAVENOUS
  Filled 2011-12-18: qty 1

## 2011-12-18 MED ORDER — SODIUM CHLORIDE 0.9 % IV BOLUS (SEPSIS)
1000.0000 mL | Freq: Once | INTRAVENOUS | Status: AC
  Administered 2011-12-18: 1000 mL via INTRAVENOUS

## 2011-12-18 MED ORDER — HYDROMORPHONE HCL PF 1 MG/ML IJ SOLN
1.0000 mg | Freq: Once | INTRAMUSCULAR | Status: AC
  Administered 2011-12-18: 1 mg via INTRAVENOUS
  Filled 2011-12-18: qty 1

## 2011-12-18 MED ORDER — ONDANSETRON HCL 4 MG PO TABS: 4.0000 mg | ORAL_TABLET | Freq: Four times a day (QID) | ORAL | Status: AC

## 2011-12-18 MED ORDER — OXYCODONE-ACETAMINOPHEN 5-325 MG PO TABS: 2.0000 | ORAL_TABLET | Freq: Once | ORAL | Status: DC

## 2011-12-18 MED ORDER — ONDANSETRON HCL 4 MG/2ML IJ SOLN
4.0000 mg | Freq: Once | INTRAMUSCULAR | Status: AC
  Administered 2011-12-18: 4 mg via INTRAVENOUS
  Filled 2011-12-18: qty 2

## 2011-12-18 NOTE — ED Provider Notes (Signed)
Medical screening examination/treatment/procedure(s) were conducted as a shared visit with non-physician practitioner(s) and myself.  I personally evaluated the patient during the encounter.  35yM with  N/v/d. Has been like this for several days. No fever or chills. No sick contacts. Syncopal event in triage. On exam laying in bed on side. NAD. Mild diffuse abdominal tenderness with no peritoneal signs. Heart rrr w/o m/r/g. Lungs clear. Skin warm and dry. Suspect viral illness or possible narcotic withdrawal. Low clinical suspicion for surgical abdomen. Plan IVF and symptomatic tx.  Raeford Razor, MD 12/18/11 [phone]

## 2011-12-18 NOTE — ED Provider Notes (Signed)
Patient report received from Jonathan Stain, NP.  35 year old male presenting the ED for evaluation of nausea, vomiting diarrhea. He was found to have orthostatic hypotension in the ED. He has been seen and evaluate. The goal is to rehydrate patient prior to discharge.  patient continues to endorse nausea, abdominal and back pain. On exam, patient appears in no acute distress. His abdomen is tender but no acute abdomen.  His vital signs improved. He is able to tolerate by mouth. I will continue to rehydrates patient and discharge as appropriate.    Fayrene Helper, PA-C 12/21/11 0725

## 2011-12-18 NOTE — ED Notes (Signed)
Pt called wife to come pick him up

## 2011-12-18 NOTE — ED Notes (Signed)
Pt napping quietly. VSS. No vomiting at this time. States "I feel terible"...

## 2011-12-18 NOTE — ED Notes (Signed)
Reviewed d/c instructions with pt,.Jonathan Perez

## 2011-12-23 NOTE — ED Provider Notes (Signed)
Medical screening examination/treatment/procedure(s) were performed by non-physician practitioner and as supervising physician I was immediately available for consultation/collaboration.  Raeford Razor, MD 12/23/11 [phone]

## 2012-04-21 ENCOUNTER — Emergency Department (HOSPITAL_COMMUNITY): Payer: Medicare Other

## 2012-04-21 ENCOUNTER — Encounter (HOSPITAL_COMMUNITY): Payer: Self-pay | Admitting: Emergency Medicine

## 2012-04-21 ENCOUNTER — Emergency Department (HOSPITAL_COMMUNITY)
Admission: EM | Admit: 2012-04-21 | Discharge: 2012-04-21 | Disposition: A | Discharge status: Home / Self Care | Payer: Medicare Other | Attending: Emergency Medicine | Admitting: Emergency Medicine

## 2012-04-21 DIAGNOSIS — R109 Unspecified abdominal pain: Secondary | ICD-10-CM | POA: Insufficient documentation

## 2012-04-21 DIAGNOSIS — N23 Unspecified renal colic: Secondary | ICD-10-CM

## 2012-04-21 DIAGNOSIS — N200 Calculus of kidney: Secondary | ICD-10-CM | POA: Insufficient documentation

## 2012-04-21 DIAGNOSIS — R319 Hematuria, unspecified: Secondary | ICD-10-CM | POA: Insufficient documentation

## 2012-04-21 DIAGNOSIS — G8929 Other chronic pain: Secondary | ICD-10-CM | POA: Insufficient documentation

## 2012-04-21 DIAGNOSIS — M549 Dorsalgia, unspecified: Secondary | ICD-10-CM | POA: Insufficient documentation

## 2012-04-21 HISTORY — DX: Calculus of kidney: N20.0

## 2012-04-21 LAB — URINALYSIS, ROUTINE W REFLEX MICROSCOPIC
Leukocytes, UA: NEGATIVE
Nitrite: NEGATIVE
Protein, ur: NEGATIVE mg/dL
Specific Gravity, Urine: 1.025 (ref 1.005–1.030)
Urobilinogen, UA: 0.2 mg/dL (ref 0.0–1.0)

## 2012-04-21 LAB — URINE MICROSCOPIC-ADD ON

## 2012-04-21 MED ORDER — IBUPROFEN 800 MG PO TABS: 800.0000 mg | ORAL_TABLET | Freq: Three times a day (TID) | ORAL | Status: AC

## 2012-04-21 MED ORDER — HYDROCODONE-ACETAMINOPHEN 7.5-500 MG/15ML PO SOLN: 7.5000 mL | Freq: Three times a day (TID) | ORAL | Status: AC | PRN

## 2012-04-21 MED ORDER — KETOROLAC TROMETHAMINE 30 MG/ML IJ SOLN
30.0000 mg | Freq: Once | INTRAMUSCULAR | Status: AC
  Administered 2012-04-21: 30 mg via INTRAVENOUS
  Filled 2012-04-21: qty 1

## 2012-04-21 MED ORDER — ONDANSETRON HCL 4 MG/2ML IJ SOLN
4.0000 mg | Freq: Once | INTRAMUSCULAR | Status: AC
  Administered 2012-04-21: 4 mg via INTRAVENOUS
  Filled 2012-04-21: qty 2

## 2012-04-21 MED ORDER — HYDROMORPHONE HCL PF 1 MG/ML IJ SOLN
1.0000 mg | INTRAMUSCULAR | Status: DC | PRN
  Administered 2012-04-21: 1 mg via INTRAVENOUS
  Filled 2012-04-21: qty 1

## 2012-04-21 MED ORDER — TAMSULOSIN HCL 0.4 MG PO CAPS: 0.4000 mg | ORAL_CAPSULE | Freq: Every day | ORAL | Status: DC

## 2012-04-21 NOTE — ED Provider Notes (Signed)
History     CSN: [id number]  Arrival date & time 04/21/12  0127   First MD Initiated Contact with Patient 04/21/12 0155      Chief Complaint  Patient presents with  . Flank Pain    (Consider location/radiation/quality/duration/timing/severity/associated sxs/prior treatment) Patient is a 36 y.o. male presenting with flank pain. The history is provided by the patient.  Flank Pain Pertinent negatives include no chest pain, no abdominal pain, no headaches and no shortness of breath.   history provided by patient. History of kidney stones. Tonight developed sudden onset of left flank pain it feels like previous kidney stones. He has also noticed blood in his urine. No fever or chills. No nausea vomiting diarrhea. No trauma. Pain is sharp in quality and not radiating. No alleviating factors. Has not tried any medications at home. Has history of chronic back pain.  Past Medical History  Diagnosis Date  . Chronic back pain   . Kidney stones     Past Surgical History  Procedure Date  . Back surgery     No family history on file.  History  Substance Use Topics  . Smoking status: Never Smoker   . Smokeless tobacco: Not on file  . Alcohol Use: No      Review of Systems  Constitutional: Negative for fever and chills.  HENT: Negative for neck pain and neck stiffness.   Eyes: Negative for pain.  Respiratory: Negative for shortness of breath.   Cardiovascular: Negative for chest pain.  Gastrointestinal: Negative for abdominal pain.  Genitourinary: Positive for flank pain. Negative for dysuria.  Musculoskeletal: Negative for back pain.  Skin: Negative for rash.  Neurological: Negative for headaches.  All other systems reviewed and are negative.    Allergies  Review of patient's allergies indicates no known allergies.  Home Medications   Current Outpatient Rx  Name Route Sig Dispense Refill  . AMPHETAMINE-DEXTROAMPHETAMINE 30 MG PO TABS Oral Take 30 mg by mouth 2 (two)  times daily.    Marland Kitchen CLONAZEPAM 2 MG PO TABS Oral Take 2 mg by mouth at bedtime.    . OXYCODONE HCL 30 MG PO TABS Oral Take 30 mg by mouth every 4 (four) hours as needed.      Marland Kitchen OXYMORPHONE HCL ER 40 MG PO TB12 Oral Take 40 mg by mouth 3 (three) times daily.      BP 148/81  Pulse 80  Temp(Src) 98.1 F (36.7 C) (Oral)  Resp 20  SpO2 100%  Physical Exam  Constitutional: He is oriented to person, place, and time. He appears well-developed and well-nourished.  HENT:  Head: Normocephalic and atraumatic.  Eyes: Conjunctivae and EOM are normal. Pupils are equal, round, and reactive to light.  Neck: Trachea normal. Neck supple. No thyromegaly present.  Cardiovascular: Normal rate, regular rhythm, S1 normal, S2 normal and normal pulses.     No systolic murmur is present   No diastolic murmur is present  Pulses:      Radial pulses are 2+ on the right side, and 2+ on the left side.  Pulmonary/Chest: Effort normal and breath sounds normal. He has no wheezes. He has no rhonchi. He has no rales. He exhibits no tenderness.  Abdominal: Soft. Normal appearance and bowel sounds are normal. He exhibits no mass. There is no rebound, no guarding, no CVA tenderness and negative Murphy's sign.       Tender over area of left flank without ecchymosis or discoloration. No peritonitis.  Musculoskeletal:  BLE:s Calves nontender, no cords or erythema, negative Homans sign  Neurological: He is alert and oriented to person, place, and time. He has normal strength. No cranial nerve deficit or sensory deficit. GCS eye subscore is 4. GCS verbal subscore is 5. GCS motor subscore is 6.  Skin: Skin is warm and dry. No rash noted. He is not diaphoretic.  Psychiatric: His speech is normal.       Cooperative and appropriate    ED Course  Procedures (including critical care time)  Labs Reviewed  URINALYSIS, ROUTINE W REFLEX MICROSCOPIC - Abnormal; Notable for the following:    Hgb urine dipstick LARGE (*)    All  other components within normal limits  URINE MICROSCOPIC-ADD ON - Abnormal; Notable for the following:    Bacteria, UA FEW (*)    Casts HYALINE CASTS (*)    All other components within normal limits   Ct Abdomen Pelvis Wo Contrast  04/21/2012  *RADIOLOGY REPORT*  Clinical Data: Left flank pain  CT ABDOMEN AND PELVIS WITHOUT CONTRAST  Technique:  Multidetector CT imaging of the abdomen and pelvis was performed following the standard protocol without intravenous contrast.  Comparison: 09/23/2011  Findings: Mild atelectasis in the lung bases.  Punctate nonobstructing stones in the midportion of the left kidney and in both lower poles.  No ureteral or bladder stones are visualized.  No pyelocaliectasis or ureterectasis.  Unenhanced appearance of the liver, spleen, gallbladder, pancreas, adrenal glands, abdominal aorta, and retroperitoneal lymph nodes is unremarkable.  Stomach, small bowel, and colon are not distended. No free air or free fluid in the abdomen.  Small umbilical hernia containing fat.  Postoperative changes with posterior fixation of the thoracolumbar spine.  Surgical clips in the left abdomen.  Pelvis:  The bladder wall is not thickened.  Fat in the inguinal canals.  Normal appendix.  No free or loculated pelvic fluid collections.  IMPRESSION: Punctate nonobstructing bilateral intrarenal stones.  No evidence of ureteral stone or obstruction.  No significant change since previous study.  Original Report Authenticated By: Marlon Pel, M.D.    IV Dilaudid. IV Zofran. CT scan obtained and reviewed as above. Patient does have blood in his urine no UTI.   MDM   Left flank pain with normal CT scan as above. No ureterolithiasis or obstruction. Plan followup with his urologist as needed. Prescription for pain medications provided.        Sunnie Nielsen, MD 04/21/12 [phone]

## 2012-04-21 NOTE — Discharge Instructions (Signed)
Ureteral Colic Ureteral colic is spasm-like pain from the kidney or the ureter. This is often caused by a kidney stone. The pain is caused by the stone trying to get through the tubes that pass your pee. HOME CARE   Drink enough fluids to keep your pee (urine) clear or pale yellow.   Strain all your pee. A strainer will be provided. Keep anything caught in the strainer and bring it to your doctor. The stone causing the pain may be very small.   Only take medicine as told by your doctor.   Follow up with your doctor as told.  GET HELP RIGHT AWAY IF:   Pain is not controlled with medicine.   Pain continues or gets worse.   The pain changes and there is chest or belly (abdominal) pain.   You pass out (faint).   You cannot pee.   You keep throwing up (vomiting).   You have a temperature by mouth above 102 F (38.9 C), not controlled by medicine.  MAKE SURE YOU:   Understand these instructions.   Will watch this condition.   Will get help right away if you are not doing well or get worse.

## 2012-04-21 NOTE — ED Notes (Signed)
PT states medicine "is not touching the pain"

## 2012-04-21 NOTE — ED Notes (Signed)
Pt in CT at this time.

## 2012-04-21 NOTE — ED Notes (Signed)
PT. REPORTS LEFT FLANK PAIN WITH HEMATURIA ONSET YESTERDAY AFTERNOON , STATES HISTORY OF KIDNEY STONES.

## 2012-07-11 ENCOUNTER — Encounter (HOSPITAL_COMMUNITY): Payer: Self-pay | Admitting: Neurology

## 2012-07-11 ENCOUNTER — Emergency Department (HOSPITAL_COMMUNITY)
Admission: EM | Admit: 2012-07-11 | Discharge: 2012-07-11 | Disposition: A | Discharge status: Home / Self Care | Payer: Medicare Other | Attending: Emergency Medicine | Admitting: Emergency Medicine

## 2012-07-11 ENCOUNTER — Emergency Department (HOSPITAL_COMMUNITY): Payer: Medicare Other

## 2012-07-11 DIAGNOSIS — G319 Degenerative disease of nervous system, unspecified: Secondary | ICD-10-CM | POA: Insufficient documentation

## 2012-07-11 DIAGNOSIS — G8190 Hemiplegia, unspecified affecting unspecified side: Secondary | ICD-10-CM | POA: Diagnosis present

## 2012-07-11 DIAGNOSIS — R29810 Facial weakness: Secondary | ICD-10-CM | POA: Insufficient documentation

## 2012-07-11 DIAGNOSIS — G8929 Other chronic pain: Secondary | ICD-10-CM | POA: Insufficient documentation

## 2012-07-11 LAB — CBC
HCT: 43.5 % (ref 39.0–52.0)
MCV: 95 fL (ref 78.0–100.0)
Platelets: 232 10*3/uL (ref 150–400)
RBC: 4.58 MIL/uL (ref 4.22–5.81)
WBC: 10.6 10*3/uL — ABNORMAL HIGH (ref 4.0–10.5)

## 2012-07-11 LAB — DIFFERENTIAL
Eosinophils Relative: 2 % (ref 0–5)
Lymphocytes Relative: 19 % (ref 12–46)
Lymphs Abs: 2 10*3/uL (ref 0.7–4.0)
Monocytes Absolute: 0.7 10*3/uL (ref 0.1–1.0)
Neutro Abs: 7.7 10*3/uL (ref 1.7–7.7)

## 2012-07-11 LAB — POCT I-STAT, CHEM 8
BUN: 14 mg/dL (ref 6–23)
Chloride: 107 mEq/L (ref 96–112)
Creatinine, Ser: 1.1 mg/dL (ref 0.50–1.35)
Glucose, Bld: 117 mg/dL — ABNORMAL HIGH (ref 70–99)
Potassium: 3.6 mEq/L (ref 3.5–5.1)
Sodium: 144 mEq/L (ref 135–145)

## 2012-07-11 LAB — COMPREHENSIVE METABOLIC PANEL
ALT: 22 U/L (ref 0–53)
CO2: 24 mEq/L (ref 19–32)
Calcium: 8.8 mg/dL (ref 8.4–10.5)
Chloride: 108 mEq/L (ref 96–112)
GFR calc Af Amer: >90 mL/min (ref >90)
GFR calc non Af Amer: >90 mL/min (ref >90)
Glucose, Bld: 123 mg/dL — ABNORMAL HIGH (ref 70–99)
Sodium: 142 mEq/L (ref 135–145)
Total Bilirubin: 0.2 mg/dL — ABNORMAL LOW (ref 0.3–1.2)

## 2012-07-11 LAB — CK TOTAL AND CKMB (NOT AT ARMC): Relative Index: 1.3 (ref 0.0–2.5)

## 2012-07-11 MED ORDER — SODIUM CHLORIDE 0.9 % IJ SOLN: 3.0000 mL | INTRAMUSCULAR | Status: DC

## 2012-07-11 MED ORDER — SODIUM CHLORIDE 0.9 % IV BOLUS (SEPSIS)
1000.0000 mL | Freq: Once | INTRAVENOUS | Status: AC
  Administered 2012-07-11: 1000 mL via INTRAVENOUS

## 2012-07-11 MED ORDER — OXYCODONE-ACETAMINOPHEN 5-325 MG PO TABS: 1.0000 | ORAL_TABLET | Freq: Once | ORAL | Status: DC

## 2012-07-11 NOTE — ED Notes (Addendum)
Code stroke was activated at 1742 after pt was LSN at 1650. Pt was working on a car when he developed sudden onset dizziness, n/v, weakness to left side. Pt arrived at our facility at 1756. EDP exam at 1757, from their pt sent to ct scan. Rapid response arrived 1753. Pt arrival in CT 1800. Phlebotomy arrival 1800. At 1829 arrived in MRI for DWI. Code stroke was cancelled at 1857 per Dr. Thad Ranger.

## 2012-07-11 NOTE — Consult Note (Signed)
Referring Physician: Ranae Palms    Chief Complaint: Left sided weakness  HPI: Jonathan Perez is an 36 y.o. male who reports that he was out fixing a car today and got overheated.  Patient developed nausea and eventually vomited three times.  Came in and about an hour later had the acute onset of feeling heavy on the left side.  This included the face, arm and leg.  Speech became slurred.  Also felt as if he could not swallow.  EMS was called and patient was brought in as a code stroke.  Initial NIHSS of 6.  LSN: [id number] tPA Given: No: Symptoms improving  Past Medical History  Diagnosis Date  . Chronic back pain   . Kidney stones     Past Surgical History  Procedure Date  . Back surgery     -  Thumb surgeries  No family history on file.  Social History:  Patient smokes. He does not have any smokeless tobacco history on file. He reports that he does not drink alcohol or use illicit drugs.  Allergies: No Known Allergies  Medications: I have reviewed the patient's current medications. Prior to Admission:  Current outpatient prescriptions:amphetamine-dextroamphetamine (ADDERALL) 30 MG tablet, Take 30 mg by mouth 2 (two) times daily., Disp: , Rfl: ;  clonazePAM (KLONOPIN) 2 MG tablet, Take 2 mg by mouth at bedtime., Disp: , Rfl: ;  oxycodone (ROXICODONE) 30 MG immediate release tablet, Take 30 mg by mouth every 4 (four) hours as needed.  , Disp: , Rfl:  oxymorphone (OPANA ER) 40 MG 12 hr tablet, Take 40 mg by mouth 3 (three) times daily., Disp: , Rfl: ;  Tamsulosin HCl (FLOMAX) 0.4 MG CAPS, Take 1 capsule (0.4 mg total) by mouth daily., Disp: 14 capsule, Rfl: 0  ROS: History obtained from the patient  General ROS: negative for - chills, fatigue, fever, night sweats, weight gain or weight loss Psychological ROS: negative for - behavioral disorder, hallucinations, memory difficulties, mood swings or suicidal ideation Ophthalmic ROS: negative for - blurry vision, double vision, eye pain or  loss of vision ENT ROS: negative for - epistaxis, nasal discharge, oral lesions, sore throat, tinnitus or vertigo Allergy and Immunology ROS: negative for - hives or itchy/watery eyes Hematological and Lymphatic ROS: negative for - bleeding problems, bruising or swollen lymph nodes Endocrine ROS: negative for - galactorrhea, hair pattern changes, polydipsia/polyuria or temperature intolerance Respiratory ROS: negative for - cough, hemoptysis, shortness of breath or wheezing Cardiovascular ROS: negative for - chest pain, dyspnea on exertion, edema or irregular heartbeat Gastrointestinal ROS: negative for - abdominal pain, diarrhea, hematemesis, nausea/vomiting or stool incontinence Genito-Urinary ROS: negative for - dysuria, hematuria, incontinence or urinary frequency/urgency Musculoskeletal ROS: back pain Neurological ROS: as noted in HPI Dermatological ROS: negative for rash and skin lesion changes  Physical Examination: Blood pressure 165/82, pulse 89, temperature 98.4 F (36.9 C), resp. rate 18, SpO2 97.00%.  Neurologic Examination: Mental Status: Alert, oriented, thought content appropriate.  Speech fluent without evidence of aphasia.  Able to follow 3 step commands without difficulty. Cranial Nerves: II: visual fields grossly normal, pupils equal, round, reactive to light and accommodation III,IV, VI: ptosis not present, extra-ocular motions intact bilaterally V,VII: smile symmetric with full effort but with decreased effort(feels heaviness on the left side of the face) some left facial droop is noted, facial light touch sensation decreased on the left at the cheek and jawbone VIII: hearing normal bilaterally IX,X: gag reflex present XI: trapezius strength/neck flexion strength normal bilaterally  XII: tongue strength normal  Motor: Right : Upper extremity   5/5    Left:     Upper extremity   4/5  Lower extremity   5/5     Lower extremity   4+-5-/5 Tone and bulk:normal tone  throughout; no atrophy noted Sensory: Pinprick and light touch intact decreased on the LUE and LLE Deep Tendon Reflexes: 2+ and symmetric throughout Plantars: Right: downgoing   Left: downgoing Cerebellar: normal finger-to-nose and normal heel-to-shin test  Laboratory Studies:  Basic Metabolic Panel:  Lab 07/11/12 4098  NA 144  K 3.6  CL 107  CO2 --  GLUCOSE 117*  BUN 14  CREATININE 1.10  CALCIUM --  MG --  PHOS --    Liver Function Tests: No results found for this basename: AST:5,ALT:5,ALKPHOS:5,BILITOT:5,PROT:5,ALBUMIN:5 in the last 168 hours No results found for this basename: LIPASE:5,AMYLASE:5 in the last 168 hours No results found for this basename: AMMONIA:3 in the last 168 hours  CBC:  Lab 07/11/12 1835 07/11/12 1800  WBC -- 10.6*  NEUTROABS -- 7.7  HGB 15.6 14.9  HCT 46.0 43.5  MCV -- 95.0  PLT -- 232    Cardiac Enzymes: No results found for this basename: CKTOTAL:5,CKMB:5,CKMBINDEX:5,TROPONINI:5 in the last 168 hours  BNP: No components found with this basename: POCBNP:5  CBG:  Lab 07/11/12 1817  GLUCAP 108*    Microbiology: No results found for this or any previous visit.  Coagulation Studies:  Basename 07/11/12 1800  LABPROT 12.8  INR 0.94    Urinalysis: No results found for this basename: COLORURINE:2,APPERANCEUR:2,LABSPEC:2,PHURINE:2,GLUCOSEU:2,HGBUR:2,BILIRUBINUR:2,KETONESUR:2,PROTEINUR:2,UROBILINOGEN:2,NITRITE:2,LEUKOCYTESUR:2 in the last 168 hours  Lipid Panel: No results found for this basename: chol, trig, hdl, cholhdl, vldl, ldlcalc    HgbA1C:  No results found for this basename: HGBA1C    Urine Drug Screen:     Component Value Date/Time   LABOPIA POSITIVE* 07/03/2011 1838   COCAINSCRNUR NONE DETECTED 07/03/2011 1838   LABBENZ NONE DETECTED 07/03/2011 1838   AMPHETMU NONE DETECTED 07/03/2011 1838   THCU POSITIVE* 07/03/2011 1838   LABBARB NONE DETECTED 07/03/2011 1838    Alcohol Level: No results found for this basename:  ETH:2 in the last 168 hours   Imaging: Ct Head Wo Contrast  07/11/2012  *RADIOLOGY REPORT*  Clinical Data: Code stroke.  CT HEAD WITHOUT CONTRAST  Technique:  Contiguous axial images were obtained from the base of the skull through the vertex without contrast.  Comparison: Head CT 12/18/2011.  Findings: No acute intracranial abnormalities.  Specifically, no definite signs of acute/subacute cerebral ischemia, no evidence of acute intracerebral hemorrhage, no focal mass, mass effect, hydrocephalus or abnormal intra or extra-axial fluid collections. Visualized paranasal sinuses and mastoids are well pneumatized.  No displaced skull fractures are identified.  IMPRESSION: 1.  No acute intracranial abnormalities. 2.  The appearance of the brain is normal.  Original Report Authenticated By: Florencia Reasons, M.D.    Assessment: 37 y.o. male presenting hypertensive with complaints of left sided numbness and weakness.  Head CT unremarkable.  No evidence of acute infarct on diffusion weighted MR imaging as well.  Patient with only risk factor of smoking.  Symptoms slowly improving.  BP improving as well.  Feel TIA unlikely as well.  Symptoms may have been related to elevated BP.  Stroke Risk Factors - smoking  Plan: 1. Smoking cessation counseling 2.  ASA 81mg  daily 3.  Observation for complete resolution of symptoms    Thana Farr, MD [facility] [phone] 07/11/2012, 6:47 PM

## 2012-07-11 NOTE — ED Notes (Addendum)
Per ems- Pt LSN at 1650, pt was working on car and developed weakness and nausea. Pt vomited x 3. Pt found to have left sided weakness, left facial droop and left arm drift. No slurry speech was noted. Pt reporting "I can't swallow well". BP initially 154/118 sitting, standing 210/120 standing, CBG 140. Last BP 166/111., HR 80. C/o "heaviness" in extremities

## 2012-07-11 NOTE — ED Notes (Signed)
Pt reporting "heaviness" in left arm and legs in subsiding but is still present in face. Pt is a x 4. Speaking in clear sentences. Only c/o right hip pain which is chronic in nature. Left grip strength is weak. Moving all extremities

## 2012-07-11 NOTE — ED Provider Notes (Signed)
History     CSN: [id number]  Arrival date & time 07/11/12  1756   First MD Initiated Contact with Patient 07/11/12 1802      Chief Complaint  Patient presents with  . Code Stroke    (Consider location/radiation/quality/duration/timing/severity/associated sxs/prior treatment) HPI Pt come in as a stroke alert. States he was working outside and got hot and nauseated. He went inside and noted that roughly 1 hour prior to arrival his l arm and leg were weak. States that that has improved but that he still has left-sided facial droop. Denies HA, neck pain, chest pain, SOB, fever chills. No risk factor for stroke. No recent trauma Past Medical History  Diagnosis Date  . Chronic back pain   . Kidney stones     Past Surgical History  Procedure Date  . Back surgery     No family history on file.  History  Substance Use Topics  . Smoking status: Never Smoker   . Smokeless tobacco: Not on file  . Alcohol Use: No      Review of Systems  Constitutional: Negative for fever and chills.  HENT: Negative for facial swelling, trouble swallowing, neck pain, neck stiffness and voice change.   Eyes: Negative for visual disturbance.  Respiratory: Negative for shortness of breath.   Cardiovascular: Negative for chest pain.  Gastrointestinal: Negative for nausea, vomiting, abdominal pain and diarrhea.  Musculoskeletal: Negative for myalgias and arthralgias.  Skin: Negative for rash.  Neurological: Positive for weakness and numbness. Negative for dizziness, seizures, syncope, light-headedness and headaches.    Allergies  Review of patient's allergies indicates no known allergies.  Home Medications   Current Outpatient Rx  Name Route Sig Dispense Refill  . AMPHETAMINE-DEXTROAMPHETAMINE 30 MG PO TABS Oral Take 30 mg by mouth 2 (two) times daily.    Marland Kitchen CLONAZEPAM 2 MG PO TABS Oral Take 2 mg by mouth at bedtime.    . OXYCODONE HCL 30 MG PO TABS Oral Take 30 mg by mouth every 4 (four) hours  as needed. For pain    . OXYMORPHONE HCL ER 40 MG PO TB12 Oral Take 40 mg by mouth 3 (three) times daily.      BP 141/86  Pulse 75  Temp 98 F (36.7 C) (Oral)  Resp 14  SpO2 97%  Physical Exam  Nursing note and vitals reviewed. Constitutional: He is oriented to person, place, and time. He appears well-developed and well-nourished. No distress.  HENT:  Head: Normocephalic and atraumatic.  Mouth/Throat: Oropharynx is clear and moist.  Eyes: EOM are normal. Pupils are equal, round, and reactive to light.  Neck: Normal range of motion. Neck supple.       No meningismus or midline TTP  Cardiovascular: Normal rate and regular rhythm.   Pulmonary/Chest: Effort normal and breath sounds normal. No respiratory distress. He has no wheezes. He has no rales.  Abdominal: Soft. Bowel sounds are normal. He exhibits no mass. There is no tenderness. There is no rebound and no guarding.  Musculoskeletal: Normal range of motion. He exhibits no edema and no tenderness.  Neurological: He is alert and oriented to person, place, and time.       Questionable L sided facial droop though effort is inadequate. 4/5 motor LUE and LLE though again poor effort. Mild decrease of sensation LUE, LLE. Normal finger to nose.   Skin: Skin is warm and dry. No rash noted. No erythema.  Psychiatric: He has a normal mood and affect. His behavior is  normal.    ED Course  Procedures (including critical care time)  Labs Reviewed  CBC - Abnormal; Notable for the following:    WBC 10.6 (*)     All other components within normal limits  COMPREHENSIVE METABOLIC PANEL - Abnormal; Notable for the following:    Glucose, Bld 123 (*)     AST 38 (*)     Total Bilirubin 0.2 (*)     All other components within normal limits  CK TOTAL AND CKMB - Abnormal; Notable for the following:    Total CK 759 (*)     CK, MB 9.9 (*)     All other components within normal limits  GLUCOSE, CAPILLARY - Abnormal; Notable for the following:     Glucose-Capillary 108 (*)     All other components within normal limits  POCT I-STAT, CHEM 8 - Abnormal; Notable for the following:    Glucose, Bld 117 (*)     All other components within normal limits  PROTIME-INR  APTT  DIFFERENTIAL  TROPONIN I   Ct Head Wo Contrast  07/11/2012  *RADIOLOGY REPORT*  Clinical Data: Code stroke.  CT HEAD WITHOUT CONTRAST  Technique:  Contiguous axial images were obtained from the base of the skull through the vertex without contrast.  Comparison: Head CT 12/18/2011.  Findings: No acute intracranial abnormalities.  Specifically, no definite signs of acute/subacute cerebral ischemia, no evidence of acute intracerebral hemorrhage, no focal mass, mass effect, hydrocephalus or abnormal intra or extra-axial fluid collections. Visualized paranasal sinuses and mastoids are well pneumatized.  No displaced skull fractures are identified.  IMPRESSION: 1.  No acute intracranial abnormalities. 2.  The appearance of the brain is normal.  Original Report Authenticated By: Florencia Reasons, M.D.   Mr Brain Wo Contrast  07/11/2012  *RADIOLOGY REPORT*  Clinical Data: Code stroke.  Left-sided weakness.  MRI HEAD WITHOUT CONTRAST  Technique:  Multiplanar, multiecho pulse sequences of the brain and surrounding structures were obtained according to standard protocol without intravenous contrast.  Comparison: CT head 07/11/2012.  Findings: The diffusion weighted images demonstrate no evidence for acute or subacute infarction.  The study is moderately degraded by patient motion.  Mild atrophy is present.  Scattered subcortical T2 hyperintensities are present bilaterally.  No hemorrhage or mass lesion is present.  Flow is present in the major intracranial arteries.  The globes and orbits are intact.  The paranasal sinuses and mastoid air cells are clear.  IMPRESSION:  1.  No acute infarcts 2.  Borderline subcortical T2 and FLAIR hyperintensities. The finding is nonspecific but can be seen in  the setting of chronic microvascular ischemia, a demyelinating process such as multiple sclerosis, vasculitis, complicated migraine headaches, or as the sequelae of a prior infectious or inflammatory process. 3.  Mild atrophy.  Original Report Authenticated By: Jamesetta Orleans. MATTERN, M.D.     1. Hemiplegia, unspecified, affecting nondominant side     Date: 07/11/2012  Rate: 74  Rhythm: normal sinus rhythm  QRS Axis: normal  Intervals: normal  ST/T Wave abnormalities: normal  Conduction Disutrbances:none  Narrative Interpretation:   Old EKG Reviewed: unchanged    MDM  Evaluated by neurology. Normal CT head and MRI brain. Neurologist felt symptoms not caused by TIA or CVA. Recommended observation. Pt states his weakness as improved and thinks his facial droop has improved. Though recommended pt stay for observation pt does not want to stay. He is aware of risks involved and is competent to make decisions regarding his  care. He is instructed to return immediately for any changes or concerns.         Loren Racer, MD 07/11/12 2117

## 2012-07-11 NOTE — ED Notes (Signed)
All belongings are with patient.

## 2012-08-07 ENCOUNTER — Encounter (HOSPITAL_COMMUNITY): Payer: Self-pay | Admitting: *Deleted

## 2012-08-07 ENCOUNTER — Emergency Department (HOSPITAL_COMMUNITY)
Admission: EM | Admit: 2012-08-07 | Discharge: 2012-08-07 | Disposition: A | Discharge status: Home / Self Care | Payer: Medicare Other | Attending: Emergency Medicine | Admitting: Emergency Medicine

## 2012-08-07 DIAGNOSIS — F17200 Nicotine dependence, unspecified, uncomplicated: Secondary | ICD-10-CM | POA: Insufficient documentation

## 2012-08-07 DIAGNOSIS — F988 Other specified behavioral and emotional disorders with onset usually occurring in childhood and adolescence: Secondary | ICD-10-CM | POA: Insufficient documentation

## 2012-08-07 DIAGNOSIS — G8929 Other chronic pain: Secondary | ICD-10-CM | POA: Insufficient documentation

## 2012-08-07 DIAGNOSIS — R109 Unspecified abdominal pain: Secondary | ICD-10-CM

## 2012-08-07 DIAGNOSIS — N200 Calculus of kidney: Secondary | ICD-10-CM | POA: Insufficient documentation

## 2012-08-07 DIAGNOSIS — Z87442 Personal history of urinary calculi: Secondary | ICD-10-CM | POA: Insufficient documentation

## 2012-08-07 HISTORY — DX: Other specified behavioral and emotional disorders with onset usually occurring in childhood and adolescence: F98.8

## 2012-08-07 LAB — URINALYSIS, ROUTINE W REFLEX MICROSCOPIC
Bilirubin Urine: NEGATIVE
Glucose, UA: NEGATIVE mg/dL
Ketones, ur: NEGATIVE mg/dL
Nitrite: NEGATIVE
Protein, ur: NEGATIVE mg/dL
Specific Gravity, Urine: 1.021 (ref 1.005–1.030)
Urobilinogen, UA: 0.2 mg/dL (ref 0.0–1.0)
pH: 5.5 (ref 5.0–8.0)

## 2012-08-07 LAB — POCT I-STAT, CHEM 8
BUN: 13 mg/dL (ref 6–23)
Calcium, Ion: 1.2 mmol/L (ref 1.12–1.23)
Chloride: 103 mEq/L (ref 96–112)
Creatinine, Ser: 1.1 mg/dL (ref 0.50–1.35)
Glucose, Bld: 97 mg/dL (ref 70–99)
HCT: 47 % (ref 39.0–52.0)
Hemoglobin: 16 g/dL (ref 13.0–17.0)
Potassium: 4 mEq/L (ref 3.5–5.1)
Sodium: 140 mEq/L (ref 135–145)
TCO2: 28 mmol/L (ref 0–100)

## 2012-08-07 LAB — URINE MICROSCOPIC-ADD ON

## 2012-08-07 MED ORDER — KETOROLAC TROMETHAMINE 60 MG/2ML IM SOLN: 60.0000 mg | Freq: Once | INTRAMUSCULAR | Status: DC

## 2012-08-07 MED ORDER — KETOROLAC TROMETHAMINE 60 MG/2ML IM SOLN
60.0000 mg | Freq: Once | INTRAMUSCULAR | Status: AC
  Administered 2012-08-07: 60 mg via INTRAMUSCULAR
  Filled 2012-08-07: qty 2

## 2012-08-07 NOTE — ED Notes (Addendum)
C/o left flank pain radiating towards LLQ onset midnight. +nausea, no emesis.  Stated, "it feels like another kidney".  Denies hematuria, reports urine "dingy colored", c/o pressure when voiding. Reports took pain meds for his chronic back pain   prior to arrival but without any improvement. Pt rode motorcycle here to ED

## 2012-08-07 NOTE — ED Notes (Signed)
Pt not in room X 2. Gown found on stretcher. ED PA informed & aware

## 2012-08-07 NOTE — ED Provider Notes (Signed)
History     CSN: [id number]  Arrival date & time 08/07/12  0810   First MD Initiated Contact with Patient 08/07/12 [phone]      Chief Complaint  Patient presents with  . Flank Pain    (Consider location/radiation/quality/duration/timing/severity/associated sxs/prior treatment) HPI Comments: Patient presents with complaint of left flank/lower back pain that began approximately midnight. He states that this pain is similar to previous stones. He has passed stones in the past without having any complications. Patient states he has had nausea but no vomiting. The pain does not radiate. It is described as sharp. No urinary symptoms or difficulty urinating. Patient is on Opana 40 mg every 12 hours and oxycodone 30 mg every 4 hours for chronic back pain. He states he's been taking his chronic pain medications and that these have not been helping. Onset was acute. Course is constant. Nothing makes symptoms better or worse. Patient denies fever or diarrhea.  Patient is a 36 y.o. male presenting with flank pain. The history is provided by the patient.  Flank Pain This is a new problem. The current episode started today. The problem occurs constantly. The problem has been unchanged. Associated symptoms include nausea. Pertinent negatives include no abdominal pain, chest pain, coughing, fever, headaches, myalgias, rash, sore throat or vomiting. Nothing aggravates the symptoms. He has tried oral narcotics for the symptoms. The treatment provided no relief.    Past Medical History  Diagnosis Date  . Chronic back pain   . Kidney stones   . Kidney stones   . ADD (attention deficit disorder)     Past Surgical History  Procedure Date  . Back surgery     No family history on file.  History  Substance Use Topics  . Smoking status: Current Everyday Smoker -- 0.5 packs/day  . Smokeless tobacco: Not on file  . Alcohol Use: No      Review of Systems  Constitutional: Negative for fever.  HENT:  Negative for sore throat and rhinorrhea.   Eyes: Negative for redness.  Respiratory: Negative for cough.   Cardiovascular: Negative for chest pain.  Gastrointestinal: Positive for nausea. Negative for vomiting, abdominal pain and diarrhea.  Genitourinary: Positive for flank pain. Negative for dysuria, frequency, hematuria, penile pain and testicular pain.  Musculoskeletal: Negative for myalgias.  Skin: Negative for rash.  Neurological: Negative for headaches.    Allergies  Reglan  Home Medications   Current Outpatient Rx  Name Route Sig Dispense Refill  . AMPHETAMINE-DEXTROAMPHETAMINE 30 MG PO TABS Oral Take 30 mg by mouth 2 (two) times daily.    Marland Kitchen GABAPENTIN 400 MG PO CAPS Oral Take 400 mg by mouth 3 (three) times daily.    . OXYCODONE HCL 30 MG PO TABS Oral Take 30 mg by mouth every 4 (four) hours as needed. For pain    . OXYMORPHONE HCL ER 40 MG PO TB12 Oral Take 40 mg by mouth 3 (three) times daily.    . QUETIAPINE FUMARATE 100 MG PO TABS Oral Take 100-300 mg by mouth at bedtime as needed. As needed to help sleep.    . SUMATRIPTAN SUCCINATE 6 MG/0.5ML [place] SOLN Subcutaneous Inject 6 mg into the skin every 2 (two) hours as needed. As needed for migraines.      BP 118/81  Pulse 67  Temp 97.7 F (36.5 C) (Oral)  Resp 18  SpO2 98%  Physical Exam  Nursing note and vitals reviewed. Constitutional: He appears well-developed and well-nourished.  HENT:  Head: Normocephalic  and atraumatic.  Eyes: Conjunctivae are normal. Right eye exhibits no discharge. Left eye exhibits no discharge.  Neck: Normal range of motion. Neck supple.  Cardiovascular: Normal rate, regular rhythm and normal heart sounds.   Pulmonary/Chest: Effort normal and breath sounds normal. No respiratory distress.  Abdominal: Soft. There is no tenderness. There is no rebound and no guarding.  Neurological: He is alert.  Skin: Skin is warm and dry.  Psychiatric: He has a normal mood and affect.    ED Course    Procedures (including critical care time)  Labs Reviewed  URINALYSIS, ROUTINE W REFLEX MICROSCOPIC - Abnormal; Notable for the following:    APPearance CLOUDY (*)     Hgb urine dipstick LARGE (*)     Leukocytes, UA SMALL (*)     All other components within normal limits  URINE MICROSCOPIC-ADD ON - Abnormal; Notable for the following:    Squamous Epithelial / LPF FEW (*)     Bacteria, UA FEW (*)     All other components within normal limits  POCT I-STAT, CHEM 8   No results found.   1. Flank pain     9:21 AM Patient seen and examined. Work-up initiated. Discussed limited utility of narcotics in someone who is on 80mg  of Opana and 30mg  oxycodone q4h. If istat shows normal renal function will give toradol. Patient rode motorcycle to ED, so also cannot give narcotics without ride home.   Vital signs reviewed and are as follows: Filed Vitals:   08/07/12 0902  BP: 118/81  Pulse: 67  Temp:   Resp: 18   10:15 AM Toradol ordered. No contraindications, do not anticipate patient needing urgent lithotripsy.   11:22 AM Patient eloped. Patient was upset about not receiving narcotics in ED.   BP 105/61  Pulse 62  Temp 97.7 F (36.5 C) (Oral)  Resp 16  SpO2 98%   MDM  Patient with history of kidney stones. Renal function normal today. UA shows blood. Overall picture is consistent with renal/ureteral colic. Patient given toradol for pain. Do not suspect infectious abdominal etiology or AAA. Normal appearing amdominal aorta on CT scan earlier this year. Patient appears comfortable while in ED. No vomiting. Patient eloped without informing staff.         Renne Crigler, [place] 08/07/12 [phone]

## 2012-08-07 NOTE — ED Notes (Signed)
Pt upset & c/o that "toradol never helps with my pain. I told the doctor that already. Now my back is really beginning to hurt. I could just go home and take my own pain medicines. Felicita Gage, PA informed & aware.

## 2012-08-07 NOTE — ED Notes (Signed)
C/o left flank pain radiating towards LLQ since midnight. +nausea, no emesis

## 2012-08-15 NOTE — ED Provider Notes (Signed)
Medical screening examination/treatment/procedure(s) were performed by non-physician practitioner and as supervising physician I was immediately available for consultation/collaboration.  Raeford Razor, MD 08/15/12 [phone]

## 2012-09-29 ENCOUNTER — Emergency Department (HOSPITAL_COMMUNITY)
Admission: EM | Admit: 2012-09-29 | Discharge: 2012-09-30 | Disposition: A | Discharge status: Home / Self Care | Payer: Medicare Other | Attending: Emergency Medicine | Admitting: Emergency Medicine

## 2012-09-29 ENCOUNTER — Encounter (HOSPITAL_COMMUNITY): Payer: Self-pay | Admitting: Emergency Medicine

## 2012-09-29 DIAGNOSIS — F17200 Nicotine dependence, unspecified, uncomplicated: Secondary | ICD-10-CM | POA: Insufficient documentation

## 2012-09-29 DIAGNOSIS — G8929 Other chronic pain: Secondary | ICD-10-CM | POA: Insufficient documentation

## 2012-09-29 DIAGNOSIS — N200 Calculus of kidney: Secondary | ICD-10-CM | POA: Insufficient documentation

## 2012-09-29 DIAGNOSIS — Z79899 Other long term (current) drug therapy: Secondary | ICD-10-CM | POA: Insufficient documentation

## 2012-09-29 DIAGNOSIS — F988 Other specified behavioral and emotional disorders with onset usually occurring in childhood and adolescence: Secondary | ICD-10-CM | POA: Insufficient documentation

## 2012-09-29 DIAGNOSIS — R112 Nausea with vomiting, unspecified: Secondary | ICD-10-CM | POA: Insufficient documentation

## 2012-09-29 DIAGNOSIS — M549 Dorsalgia, unspecified: Secondary | ICD-10-CM | POA: Insufficient documentation

## 2012-09-29 DIAGNOSIS — R197 Diarrhea, unspecified: Secondary | ICD-10-CM | POA: Insufficient documentation

## 2012-09-29 LAB — COMPREHENSIVE METABOLIC PANEL
ALT: 13 U/L (ref 0–53)
AST: 18 U/L (ref 0–37)
Alkaline Phosphatase: 70 U/L (ref 39–117)
Calcium: 9 mg/dL (ref 8.4–10.5)
Potassium: 3.5 mEq/L (ref 3.5–5.1)
Sodium: 137 mEq/L (ref 135–145)
Total Protein: 7.4 g/dL (ref 6.0–8.3)

## 2012-09-29 LAB — CBC WITH DIFFERENTIAL/PLATELET
Basophils Absolute: 0 10*3/uL (ref 0.0–0.1)
Eosinophils Absolute: 0 10*3/uL (ref 0.0–0.7)
Eosinophils Relative: 0 % (ref 0–5)
Lymphocytes Relative: 34 % (ref 12–46)
Lymphs Abs: 2.5 10*3/uL (ref 0.7–4.0)
MCH: 31.8 pg (ref 26.0–34.0)
MCV: 89.3 fL (ref 78.0–100.0)
Neutrophils Relative %: 56 % (ref 43–77)
Platelets: 187 10*3/uL (ref 150–400)
RBC: 5.41 MIL/uL (ref 4.22–5.81)
RDW: 12.9 % (ref 11.5–15.5)
WBC: 7.2 10*3/uL (ref 4.0–10.5)

## 2012-09-29 MED ORDER — HYDROMORPHONE HCL PF 1 MG/ML IJ SOLN
1.0000 mg | Freq: Once | INTRAMUSCULAR | Status: AC
  Administered 2012-09-29: 1 mg via INTRAVENOUS
  Filled 2012-09-29: qty 1

## 2012-09-29 MED ORDER — SODIUM CHLORIDE 0.9 % IV BOLUS (SEPSIS)
1000.0000 mL | Freq: Once | INTRAVENOUS | Status: AC
  Administered 2012-09-29: 1000 mL via INTRAVENOUS

## 2012-09-29 MED ORDER — ONDANSETRON HCL 4 MG/2ML IJ SOLN
4.0000 mg | Freq: Once | INTRAMUSCULAR | Status: AC
  Administered 2012-09-29: 4 mg via INTRAVENOUS
  Filled 2012-09-29: qty 2

## 2012-09-29 NOTE — ED Notes (Signed)
Pt having dry heaves in triage

## 2012-09-29 NOTE — ED Notes (Signed)
Pt states he has metal rods in his back and goes to the pain clinic  Pt states Thursday night he started having vomiting  Pt states he continued to have vomiting on Friday  Pt states last vomited about 2 hrs ago  Pt states he is unable to hold down his meds and thinks may be in the beginning of DTs from his meds because he is unable to hold them down  Pt states he feels weak all over  Pt states he has had 2 near syncopal episodes today

## 2012-09-29 NOTE — ED Provider Notes (Signed)
History     CSN: [id number]  Arrival date & time 09/29/12  2029   First MD Initiated Contact with Patient 09/29/12 2247      Chief Complaint  Patient presents with  . Emesis    (Consider location/radiation/quality/duration/timing/severity/associated sxs/prior treatment) HPI Comments: Patient states, that on Thursday, after dinner.  He started vomiting, and having diarrhea.  This has been persistent with all food consumption since that time.  Reports 10-12 times daily.  He also reports he is unable to tolerate any of his routine pain medications and he, feels, like at this time.  It may be a combination of withdrawal from his chronic pain medication, plus the viral illness, that he's had there is no family members at home.  That have similar symptoms.  He does have a regular pain.  Dr. And medication at home  Patient is a 36 y.o. male presenting with vomiting. The history is provided by the patient.  Emesis  This is a new problem. The current episode started more than 2 days ago. The problem occurs 5 to 10 times per day. The problem has not changed since onset.Associated symptoms include diarrhea. Pertinent negatives include no abdominal pain and no fever.    Past Medical History  Diagnosis Date  . Chronic back pain   . Kidney stones   . Kidney stones   . ADD (attention deficit disorder)     Past Surgical History  Procedure Date  . Back surgery   . Hand surgery     Family History  Problem Relation Age of Onset  . Diabetes Other   . Stroke Other     History  Substance Use Topics  . Smoking status: Current Every Day Smoker -- 0.5 packs/day    Types: Cigarettes  . Smokeless tobacco: Not on file  . Alcohol Use: No      Review of Systems  Constitutional: Negative for fever.  Gastrointestinal: Positive for nausea, vomiting and diarrhea. Negative for abdominal pain.  Genitourinary: Negative for dysuria and flank pain.  Skin: Negative for rash and wound.    Neurological: Positive for weakness. Negative for dizziness.    Allergies  Reglan  Home Medications   Current Outpatient Rx  Name Route Sig Dispense Refill  . AMPHETAMINE-DEXTROAMPHETAMINE 30 MG PO TABS Oral Take 30 mg by mouth 2 (two) times daily.    Marland Kitchen CLONAZEPAM 2 MG PO TABS Oral Take 2 mg by mouth 2 (two) times daily as needed. sleep    . GABAPENTIN 400 MG PO CAPS Oral Take 400 mg by mouth 3 (three) times daily.     . OXYCODONE HCL 30 MG PO TABS Oral Take 30 mg by mouth every 4 (four) hours as needed. For pain    . OXYMORPHONE HCL ER 40 MG PO TB12 Oral Take 40 mg by mouth 3 (three) times daily.    . SUMATRIPTAN SUCCINATE 6 MG/0.5ML [place] SOLN Subcutaneous Inject 6 mg into the skin every 2 (two) hours as needed. As needed for migraines.    . ONDANSETRON HCL 4 MG PO TABS Oral Take 1 tablet (4 mg total) by mouth every 6 (six) hours. 12 tablet 0    BP 127/86  Pulse 57  Temp 98.6 F (37 C) (Oral)  Resp 20  SpO2 97%  Physical Exam  Constitutional: He appears well-developed and well-nourished.  HENT:  Head: Normocephalic.  Eyes: Pupils are equal, round, and reactive to light.  Neck: Normal range of motion.  Cardiovascular: Normal rate.  Pulmonary/Chest: Effort normal.  Abdominal: Soft. He exhibits no distension. Bowel sounds are increased. There is no tenderness.  Musculoskeletal: Normal range of motion.  Skin: Skin is warm and dry. No rash noted.    ED Course  Procedures (including critical care time)  Labs Reviewed  CBC WITH DIFFERENTIAL - Abnormal; Notable for the following:    Hemoglobin 17.2 (*)     All other components within normal limits  COMPREHENSIVE METABOLIC PANEL - Abnormal; Notable for the following:    Glucose, Bld 102 (*)     GFR calc non Af Amer 87 (*)     All other components within normal limits  LIPASE, BLOOD   No results found.   1. Nausea, vomiting and diarrhea       MDM  Will check electrolytes IV hydrate and treat chronic pain  Feels  better after his normal home medications        Arman Filter, NP 09/30/12 0107  Arman Filter, NP 09/30/12 0107

## 2012-09-30 MED ORDER — GABAPENTIN 400 MG PO CAPS: 400.0000 mg | ORAL_CAPSULE | Freq: Three times a day (TID) | ORAL | Status: DC

## 2012-09-30 MED ORDER — ONDANSETRON HCL 4 MG PO TABS: 4.0000 mg | ORAL_TABLET | Freq: Four times a day (QID) | ORAL | Status: DC

## 2012-09-30 MED ORDER — OXYCODONE HCL 5 MG PO TABS
30.0000 mg | ORAL_TABLET | Freq: Once | ORAL | Status: AC
  Administered 2012-09-30: 30 mg via ORAL
  Filled 2012-09-30: qty 6

## 2012-09-30 MED ORDER — MORPHINE SULFATE ER 30 MG PO TBCR
30.0000 mg | EXTENDED_RELEASE_TABLET | Freq: Once | ORAL | Status: AC
  Administered 2012-09-30: 30 mg via ORAL
  Filled 2012-09-30: qty 1

## 2012-09-30 NOTE — ED Provider Notes (Signed)
Medical screening examination/treatment/procedure(s) were performed by non-physician practitioner and as supervising physician I was immediately available for consultation/collaboration.   Celene Kras, MD 09/30/12 [phone]

## 2012-11-16 ENCOUNTER — Emergency Department (HOSPITAL_COMMUNITY): Payer: Medicare Other

## 2012-11-16 ENCOUNTER — Encounter (HOSPITAL_COMMUNITY): Payer: Self-pay | Admitting: *Deleted

## 2012-11-16 ENCOUNTER — Inpatient Hospital Stay (HOSPITAL_COMMUNITY)
Admission: EM | Admit: 2012-11-16 | Discharge: 2012-11-19 | DRG: 917 | Disposition: A | Discharge status: Home / Self Care | Payer: Medicare Other | Attending: Emergency Medicine | Admitting: Emergency Medicine

## 2012-11-16 DIAGNOSIS — Z79899 Other long term (current) drug therapy: Secondary | ICD-10-CM

## 2012-11-16 DIAGNOSIS — G4700 Insomnia, unspecified: Secondary | ICD-10-CM | POA: Diagnosis present

## 2012-11-16 DIAGNOSIS — T481X4A Poisoning by skeletal muscle relaxants [neuromuscular blocking agents], undetermined, initial encounter: Secondary | ICD-10-CM

## 2012-11-16 DIAGNOSIS — F3289 Other specified depressive episodes: Secondary | ICD-10-CM | POA: Diagnosis present

## 2012-11-16 DIAGNOSIS — T50992A Poisoning by other drugs, medicaments and biological substances, intentional self-harm, initial encounter: Secondary | ICD-10-CM | POA: Diagnosis present

## 2012-11-16 DIAGNOSIS — M549 Dorsalgia, unspecified: Secondary | ICD-10-CM | POA: Diagnosis present

## 2012-11-16 DIAGNOSIS — F329 Major depressive disorder, single episode, unspecified: Secondary | ICD-10-CM | POA: Diagnosis present

## 2012-11-16 DIAGNOSIS — T6591XA Toxic effect of unspecified substance, accidental (unintentional), initial encounter: Secondary | ICD-10-CM

## 2012-11-16 DIAGNOSIS — F988 Other specified behavioral and emotional disorders with onset usually occurring in childhood and adolescence: Secondary | ICD-10-CM

## 2012-11-16 DIAGNOSIS — R4182 Altered mental status, unspecified: Secondary | ICD-10-CM | POA: Diagnosis present

## 2012-11-16 DIAGNOSIS — I498 Other specified cardiac arrhythmias: Secondary | ICD-10-CM

## 2012-11-16 DIAGNOSIS — I10 Essential (primary) hypertension: Secondary | ICD-10-CM | POA: Diagnosis present

## 2012-11-16 DIAGNOSIS — E875 Hyperkalemia: Secondary | ICD-10-CM | POA: Diagnosis present

## 2012-11-16 DIAGNOSIS — E876 Hypokalemia: Secondary | ICD-10-CM

## 2012-11-16 DIAGNOSIS — G8190 Hemiplegia, unspecified affecting unspecified side: Secondary | ICD-10-CM

## 2012-11-16 DIAGNOSIS — R001 Bradycardia, unspecified: Secondary | ICD-10-CM | POA: Diagnosis present

## 2012-11-16 DIAGNOSIS — T50902A Poisoning by unspecified drugs, medicaments and biological substances, intentional self-harm, initial encounter: Secondary | ICD-10-CM

## 2012-11-16 DIAGNOSIS — N200 Calculus of kidney: Secondary | ICD-10-CM

## 2012-11-16 DIAGNOSIS — J9600 Acute respiratory failure, unspecified whether with hypoxia or hypercapnia: Secondary | ICD-10-CM | POA: Diagnosis present

## 2012-11-16 DIAGNOSIS — G8929 Other chronic pain: Secondary | ICD-10-CM

## 2012-11-16 DIAGNOSIS — J9690 Respiratory failure, unspecified, unspecified whether with hypoxia or hypercapnia: Secondary | ICD-10-CM

## 2012-11-16 DIAGNOSIS — D72829 Elevated white blood cell count, unspecified: Secondary | ICD-10-CM

## 2012-11-16 DIAGNOSIS — F17200 Nicotine dependence, unspecified, uncomplicated: Secondary | ICD-10-CM | POA: Diagnosis present

## 2012-11-16 LAB — POCT I-STAT 3, ART BLOOD GAS (G3+)
pCO2 arterial: 32.7 mmHg — ABNORMAL LOW (ref 35.0–45.0)
pH, Arterial: 7.387 (ref 7.350–7.450)
pO2, Arterial: 95 mmHg (ref 80.0–100.0)

## 2012-11-16 LAB — CBC WITH DIFFERENTIAL/PLATELET
Basophils Absolute: 0 10*3/uL (ref 0.0–0.1)
HCT: 48.2 % (ref 39.0–52.0)
Lymphocytes Relative: 12 % (ref 12–46)
Lymphs Abs: 2.2 10*3/uL (ref 0.7–4.0)
Monocytes Absolute: 0.9 10*3/uL (ref 0.1–1.0)
Neutro Abs: 14.8 10*3/uL — ABNORMAL HIGH (ref 1.7–7.7)
RBC: 5.24 MIL/uL (ref 4.22–5.81)
RDW: 13.6 % (ref 11.5–15.5)
WBC: 18 10*3/uL — ABNORMAL HIGH (ref 4.0–10.5)

## 2012-11-16 LAB — COMPREHENSIVE METABOLIC PANEL
ALT: 28 U/L (ref 0–53)
AST: 33 U/L (ref 0–37)
CO2: 22 mEq/L (ref 19–32)
Chloride: 96 mEq/L (ref 96–112)
Creatinine, Ser: 1.26 mg/dL (ref 0.50–1.35)
GFR calc non Af Amer: 71 mL/min — ABNORMAL LOW (ref >90)
Glucose, Bld: 323 mg/dL — ABNORMAL HIGH (ref 70–99)
Sodium: 131 mEq/L — ABNORMAL LOW (ref 135–145)
Total Bilirubin: 1 mg/dL (ref 0.3–1.2)

## 2012-11-16 LAB — RAPID URINE DRUG SCREEN, HOSP PERFORMED
Barbiturates: NOT DETECTED
Tetrahydrocannabinol: NOT DETECTED

## 2012-11-16 LAB — CBC
HCT: 43.4 % (ref 39.0–52.0)
MCHC: 35.3 g/dL (ref 30.0–36.0)
MCV: 91.4 fL (ref 78.0–100.0)
RDW: 13.3 % (ref 11.5–15.5)

## 2012-11-16 MED ORDER — SODIUM CHLORIDE 0.9 % IV SOLN: 250.0000 mL | INTRAVENOUS | Status: DC | PRN

## 2012-11-16 MED ORDER — VECURONIUM BROMIDE 10 MG IV SOLR
15.0000 mg | Freq: Once | INTRAVENOUS | Status: AC
  Administered 2012-11-16: 15 mg via INTRAVENOUS

## 2012-11-16 MED ORDER — MIDAZOLAM HCL 2 MG/2ML IJ SOLN
2.0000 mg | INTRAMUSCULAR | Status: DC | PRN
  Administered 2012-11-16: 4 mg via INTRAVENOUS
  Filled 2012-11-16: qty 4
  Filled 2012-11-16: qty 2

## 2012-11-16 MED ORDER — ETOMIDATE 2 MG/ML IV SOLN
INTRAVENOUS | Status: AC
  Administered 2012-11-16: 30 mg
  Filled 2012-11-16: qty 20

## 2012-11-16 MED ORDER — ROCURONIUM BROMIDE 50 MG/5ML IV SOLN
INTRAVENOUS | Status: AC
  Filled 2012-11-16: qty 2

## 2012-11-16 MED ORDER — HEPARIN SODIUM (PORCINE) 5000 UNIT/ML IJ SOLN
5000.0000 [IU] | Freq: Three times a day (TID) | INTRAMUSCULAR | Status: DC
  Administered 2012-11-17 – 2012-11-19 (×7): 5000 [IU] via SUBCUTANEOUS
  Filled 2012-11-16 (×10): qty 1

## 2012-11-16 MED ORDER — VECURONIUM BROMIDE 10 MG IV SOLR
INTRAVENOUS | Status: AC
  Filled 2012-11-16: qty 20

## 2012-11-16 MED ORDER — LIDOCAINE HCL (CARDIAC) 20 MG/ML IV SOLN
INTRAVENOUS | Status: AC
  Filled 2012-11-16: qty 5

## 2012-11-16 MED ORDER — PANTOPRAZOLE SODIUM 40 MG IV SOLR
40.0000 mg | Freq: Every day | INTRAVENOUS | Status: DC
  Administered 2012-11-17: 40 mg via INTRAVENOUS
  Filled 2012-11-16 (×2): qty 40

## 2012-11-16 MED ORDER — ATROPINE SULFATE 0.1 MG/ML IJ SOLN
INTRAMUSCULAR | Status: AC
  Filled 2012-11-16: qty 10

## 2012-11-16 MED ORDER — FENTANYL CITRATE 0.05 MG/ML IJ SOLN
50.0000 ug | INTRAMUSCULAR | Status: DC | PRN
  Administered 2012-11-17 (×2): 100 ug via INTRAVENOUS
  Filled 2012-11-16 (×2): qty 2

## 2012-11-16 MED ORDER — NITROGLYCERIN IN D5W 200-5 MCG/ML-% IV SOLN
INTRAVENOUS | Status: AC
  Administered 2012-11-16: 10 ug/min via INTRAVENOUS
  Filled 2012-11-16: qty 250

## 2012-11-16 MED ORDER — SUCCINYLCHOLINE CHLORIDE 20 MG/ML IJ SOLN
INTRAMUSCULAR | Status: AC
  Administered 2012-11-16: 150 mg
  Filled 2012-11-16: qty 1

## 2012-11-16 MED ORDER — LORAZEPAM 2 MG/ML IJ SOLN: 4.0000 mg | Freq: Once | INTRAMUSCULAR | Status: DC

## 2012-11-16 MED ORDER — NITROGLYCERIN IN D5W 200-5 MCG/ML-% IV SOLN
2.0000 ug/min | Freq: Once | INTRAVENOUS | Status: AC
  Administered 2012-11-16: 10 ug/min via INTRAVENOUS

## 2012-11-16 NOTE — ED Notes (Signed)
Pt denies SI. Took medication to make pain going away. Denies taking other medication or ETOH. Pt hx of back surgery.

## 2012-11-16 NOTE — ED Notes (Signed)
Ethelda Chick MD, Freida Busman MD, Melissa RN, Maury Dus, Clyde Lundborg, Respiratory tech in room

## 2012-11-16 NOTE — ED Notes (Addendum)
Pt in via [facility], per report EMS called @ 20:06, pt in from home, upon EMS arrival to home, the pts wife stated, "He took 119 Zanaflex 4mg  tonight @ 19:50." upon EMS arrival the pt A&O x4, speaking in complete sentences but was lethargic, pt was calm & cooperative in route & during transport the pt HR 20-30s & EMS started pacing the pt at rate of 60 BPM, pt responded to verbal stimuli, pt GCS 11 upon arrival here, Jacubowitz, MD at bedside upon arrival, 50 mcg Fentanyl at bedside

## 2012-11-16 NOTE — ED Notes (Signed)
[phone] Jonathan Perez (wife), [phone] Jonathan Perez (mother)

## 2012-11-16 NOTE — ED Notes (Signed)
Poison control center called. Alexia Freestone is contact. Recommended respiratory  and cardiac support as needed, normal drug screen panel to be completed, do not attempt charcoal due to CNS depression. EDP Allen notified.

## 2012-11-16 NOTE — ED Notes (Signed)
Pt to CT scanner accompanied by Thayer Ohm, Charity fundraiser

## 2012-11-16 NOTE — ED Notes (Addendum)
Pt returned to room, pts pupils non reactive, pt responds to verbal stimuli, pt speaking incomplete sentences, pt hard to understand

## 2012-11-16 NOTE — ED Provider Notes (Signed)
Level V caveat Patient arrives less responsive. Admits to taking an overdose of Zanaflex.. Treated by EMS with fentanyl 50 mg and external cardiac pacing Patient arrives with speech slurred, moves all extremities follows simple commands Glasgow Coma Score 1 noted to be markedly bradycardic. Lungs clear auscultation Neurologic Glasgow Coma Score 14 speech slurred moves all extremities cranial nerves II through XII grossly intact. Gag reflex intact patient taken emergently to the CT scanner to rule out intracranial bleed. Spoke with radiologist CT scan negative for bleed. Patient having hypertensive emergency in addition to drug overdose. To start nitroglycerin intravenous drip. Critical care medicine team called to evaluate patient for admission At 9:50 PM mental status remains unchanged. He follows simple commands, speech is slurred, moves all extremities. It was decided to intubate the patient as his mental status was somnolent. The patient was taking shallow respirations.  Patient was intubated by Dr. Alinda Deem at 22:59 PM after premedication with etomidate and succinylcholine. I supervised the procedure and was present during the entire procedure. CRITICAL CARE Performed by: Doug Sou   Total critical care time: 45 minute  Critical care time was exclusive of separately billable procedures and treating other patients.  Critical care was necessary to treat or prevent imminent or life-threatening deterioration.  Critical care was time spent personally by me on the following activities: development of treatment plan with patient and/or surrogate as well as nursing, discussions with consultants, evaluation of patient's response to treatment, examination of patient, obtaining history from patient or surrogate, ordering and performing treatments and interventions, ordering and review of laboratory studies, ordering and review of radiographic studies, pulse oximetry and re-evaluation of patient's  condition.  Doug Sou, MD 11/16/12 2325

## 2012-11-16 NOTE — ED Provider Notes (Signed)
History     CSN: [id number]  Arrival date & time 11/16/12  2056   First MD Initiated Contact with Patient 11/16/12 2107     Chief Complaint  Patient presents with  . Drug Overdose   HPI: Jonathan Perez is a 36 yo CM with history of chronic back pain, kidney stones and ADD presents with intentional overdose of Zanaflex. He took 119, 4 mg tablets at 8 pm. He became more somnolent so his family members called EMS. On arrival of EMS, he was hypertensive to 220/100 with HR of 20. They initiated transcutaneous pacing with improvement of HR to 60's. On arrival to the ED, he had a GCS of 14, slurred speech, somnolence but easily aroursable. He denies any co-ingestions.   Past Medical History  Diagnosis Date  . Chronic back pain   . Kidney stones   . Kidney stones   . ADD (attention deficit disorder)     Past Surgical History  Procedure Date  . Back surgery   . Hand surgery     Family History  Problem Relation Age of Onset  . Diabetes Other   . Stroke Other     History  Substance Use Topics  . Smoking status: Current Every Day Smoker -- 0.5 packs/day    Types: Cigarettes  . Smokeless tobacco: Not on file  . Alcohol Use: No     Review of Systems  Unable to perform ROS: Mental status change   Allergies  Reglan  Home Medications   Current Outpatient Rx  Name  Route  Sig  Dispense  Refill  . AMPHETAMINE-DEXTROAMPHETAMINE 30 MG PO TABS   Oral   Take 30 mg by mouth 2 (two) times daily.         Marland Kitchen CLONAZEPAM 2 MG PO TABS   Oral   Take 2 mg by mouth 2 (two) times daily as needed. sleep         . GABAPENTIN 400 MG PO CAPS   Oral   Take 400 mg by mouth 3 (three) times daily.          Marland Kitchen ONDANSETRON HCL 4 MG PO TABS   Oral   Take 1 tablet (4 mg total) by mouth every 6 (six) hours.   12 tablet   0   . OXYCODONE HCL 30 MG PO TABS   Oral   Take 30 mg by mouth every 4 (four) hours as needed. For pain         . OXYMORPHONE HCL ER 40 MG PO TB12   Oral   Take 40  mg by mouth 3 (three) times daily.         . SUMATRIPTAN SUCCINATE 6 MG/0.5ML [place] SOLN   Subcutaneous   Inject 6 mg into the skin every 2 (two) hours as needed. As needed for migraines.           BP 210/107  Pulse 40  Temp 98.1 F (36.7 C)  Resp 8  SpO2 100%  Physical Exam  Nursing note and vitals reviewed. Constitutional: He is oriented to person, place, and time. He appears well-developed and well-nourished. He appears lethargic. He is easily aroused. He has a sickly appearance.  HENT:  Head: Normocephalic and atraumatic.  Mouth/Throat: Mucous membranes are dry.  Eyes: Conjunctivae normal and EOM are normal. Right pupil is not reactive. Left pupil is not reactive.  Neck: Trachea normal. Neck supple. No JVD present.  Cardiovascular: Regular rhythm, S1 normal, S2 normal and normal heart  sounds.  Bradycardia present.  Exam reveals no decreased pulses.   Pulmonary/Chest: Bradypnea noted. He has no rales.  Abdominal: Soft. Normal appearance and bowel sounds are normal. There is no tenderness.  Musculoskeletal: Normal range of motion. He exhibits no tenderness.  Neurological: He is oriented to person, place, and time and easily aroused. He appears lethargic. No cranial nerve deficit. GCS eye subscore is 4. GCS verbal subscore is 4. GCS motor subscore is 6.       Neurologic exam limited by patient's mental status. No obvious focal neurologic deficit.   Skin: Skin is warm and dry.    ED Course  INTUBATION Date/Time: 11/16/2012 10:59 PM Performed by: Margie Billet Authorized by: Doug Sou Consent: The procedure was performed in an emergent situation. Consent given by: patient (Verbal) Patient identity confirmed: arm band Indications: respiratory failure and airway protection Intubation method: video-assisted Patient status: paralyzed (RSI) Sedatives: etomidate Paralytic: succinylcholine Laryngoscope size: Mac 4 Tube size: 8.0 mm Tube type: cuffed Number of attempts:  1 Cricoid pressure: no Cords visualized: yes Post-procedure assessment: chest rise and CO2 detector Breath sounds: equal ETT to lip: 24 cm ETT to teeth: 25 cm Chest x-ray interpreted by me and radiologist. Chest x-ray findings: endotracheal tube in appropriate position    Labs Reviewed  POCT I-STAT 3, BLOOD GAS (G3+) - Abnormal; Notable for the following:    pCO2 arterial 32.7 (*)     Bicarbonate 19.7 (*)     Acid-base deficit 4.0 (*)     All other components within normal limits  BLOOD GAS, ARTERIAL  CBC WITH DIFFERENTIAL  COMPREHENSIVE METABOLIC PANEL  LACTIC ACID, PLASMA  URINE RAPID DRUG SCREEN (HOSP PERFORMED)   Ct Head Wo Contrast  11/16/2012  *RADIOLOGY REPORT*  Clinical Data: Overdose.Hypertensive emergency.  CT HEAD WITHOUT CONTRAST  Technique:  Contiguous axial images were obtained from the base of the skull through the vertex without contrast.  Comparison: MRI brain 07/11/2012.  Findings: No mass lesion, mass effect, midline shift, hydrocephalus, hemorrhage.  No territorial ischemia or acute infarction.  Paranasal sinuses appear within normal limits.  Study was discussed with Dr. Ethelda Chick at the time of dictation.  IMPRESSION: Negative CT head.   Original Report Authenticated By: Andreas Newport, M.D.    Dg Chest Portable 1 View  11/16/2012  *RADIOLOGY REPORT*  Clinical Data: Shortness of breath.  Decreased heart rate.  PORTABLE CHEST - 1 VIEW  Comparison: 03/07/2011  Findings: Shallow inspiration.  Normal heart size and pulmonary vascularity.  No focal airspace consolidation in the lungs.  No blunting of costophrenic angles.  No pneumothorax.  Mediastinal contours appear intact.  Postoperative changes in the thoracolumbar spine.  No significant change since previous study.  IMPRESSION: No evidence of active pulmonary disease.   Original Report Authenticated By: Burman Nieves, M.D.    No diagnosis found.  MDM  36 yo CM with history of chronic back pain, kidney stones and  ADD presents with intentional overdose of Zanaflex. Afebrile, bradycardic, hypertensive. GCS 14, protecting airway initially. Stopped transcutaneous pacing as he was perfusing well despite profound bradycardia. Called poison control, they recommended atropine as needed, and supportive care. Advised against charcoal. Patient started on Nitro gtt to gain better control of BP. Patient underwent head CT to r/o [place] response, this was negative for acute abnormality. Initial CXR without abnormality. WBC 18, Hgb 17.1 (reflecting dehydration), UDS + for opiates and benzos. K 5.3, NA 131, glucose 323, other lytes and cr normal. Presentation c/w Zanaflex ingestion. BP  improved with nitro gtt. Discussed case with critical care, they recommended intubation which was done without difficulty. Patient sedated with ativan and vecuronium. He was admitted to ICU in stable condition. Discussed results of w/u and plan to intubate with the family, they were in agreement.   Reviewed imaging, labs and previous medical records, utilized in MDM  Discussed case with Dr. Ethelda Chick  Clinical Impression 1. Zanaflex ingestion 2. Acute respiratory failure 3. Hyperglycemia 4. Hyperkalemia 5. Lactic acidosis 6. Leukocytosis 7. Chronic pain        Margie Billet, MD 11/17/12 [phone]

## 2012-11-16 NOTE — ED Notes (Signed)
Suction on and connected

## 2012-11-17 DIAGNOSIS — T50902A Poisoning by unspecified drugs, medicaments and biological substances, intentional self-harm, initial encounter: Secondary | ICD-10-CM | POA: Diagnosis present

## 2012-11-17 DIAGNOSIS — J9600 Acute respiratory failure, unspecified whether with hypoxia or hypercapnia: Secondary | ICD-10-CM

## 2012-11-17 DIAGNOSIS — T6591XA Toxic effect of unspecified substance, accidental (unintentional), initial encounter: Secondary | ICD-10-CM

## 2012-11-17 LAB — BASIC METABOLIC PANEL
BUN: 14 mg/dL (ref 6–23)
CO2: 20 mEq/L (ref 19–32)
CO2: 19 mEq/L (ref 19–32)
Chloride: 102 mEq/L (ref 96–112)
GFR calc Af Amer: >90 mL/min (ref >90)
GFR calc non Af Amer: >90 mL/min (ref >90)
Glucose, Bld: 116 mg/dL — ABNORMAL HIGH (ref 70–99)
Potassium: 4.6 mEq/L (ref 3.5–5.1)
Potassium: 4 mEq/L (ref 3.5–5.1)
Sodium: 133 mEq/L — ABNORMAL LOW (ref 135–145)

## 2012-11-17 LAB — URINALYSIS, ROUTINE W REFLEX MICROSCOPIC
Bilirubin Urine: NEGATIVE
Bilirubin Urine: NEGATIVE
Ketones, ur: NEGATIVE mg/dL
Ketones, ur: NEGATIVE mg/dL
Nitrite: NEGATIVE
Nitrite: NEGATIVE
Protein, ur: NEGATIVE mg/dL
Specific Gravity, Urine: 1.018 (ref 1.005–1.030)
Specific Gravity, Urine: 1.017 (ref 1.005–1.030)
Urobilinogen, UA: 0.2 mg/dL (ref 0.0–1.0)
Urobilinogen, UA: 0.2 mg/dL (ref 0.0–1.0)

## 2012-11-17 LAB — BLOOD GAS, ARTERIAL
Bicarbonate: 20.6 mEq/L (ref 20.0–24.0)
Drawn by: 347621
PEEP: 5 cmH2O
Patient temperature: 98.1
RATE: 16 resp/min
pH, Arterial: 7.453 — ABNORMAL HIGH (ref 7.350–7.450)

## 2012-11-17 LAB — URINE MICROSCOPIC-ADD ON

## 2012-11-17 LAB — ACETAMINOPHEN LEVEL: Acetaminophen (Tylenol), Serum: <15 ug/mL (ref 10–30)

## 2012-11-17 LAB — CBC
HCT: 44.1 % (ref 39.0–52.0)
Hemoglobin: 15.3 g/dL (ref 13.0–17.0)
MCH: 31.7 pg (ref 26.0–34.0)
MCHC: 34.7 g/dL (ref 30.0–36.0)

## 2012-11-17 LAB — MRSA PCR SCREENING: MRSA by PCR: NEGATIVE

## 2012-11-17 LAB — GLUCOSE, CAPILLARY: Glucose-Capillary: 160 mg/dL — ABNORMAL HIGH (ref 70–99)

## 2012-11-17 MED ORDER — FENTANYL CITRATE 0.05 MG/ML IJ SOLN
25.0000 ug | INTRAMUSCULAR | Status: DC | PRN
  Administered 2012-11-17 – 2012-11-18 (×9): 50 ug via INTRAVENOUS
  Filled 2012-11-17 (×9): qty 2

## 2012-11-17 MED ORDER — SODIUM CHLORIDE 0.9 % IV SOLN
INTRAVENOUS | Status: DC
  Administered 2012-11-17 (×2): via INTRAVENOUS

## 2012-11-17 MED ORDER — BIOTENE DRY MOUTH MT LIQD
15.0000 mL | Freq: Two times a day (BID) | OROMUCOSAL | Status: DC
  Administered 2012-11-17 – 2012-11-19 (×6): 15 mL via OROMUCOSAL

## 2012-11-17 MED ORDER — CHLORHEXIDINE GLUCONATE 0.12 % MT SOLN
15.0000 mL | Freq: Two times a day (BID) | OROMUCOSAL | Status: DC
  Administered 2012-11-17 – 2012-11-19 (×4): 15 mL via OROMUCOSAL
  Filled 2012-11-17 (×7): qty 15

## 2012-11-17 MED ORDER — ATROPINE SULFATE 1 MG/ML IJ SOLN
INTRAMUSCULAR | Status: AC
  Administered 2012-11-17: 1 mg
  Filled 2012-11-17: qty 1

## 2012-11-17 MED ORDER — NITROGLYCERIN IN D5W 200-5 MCG/ML-% IV SOLN
2.0000 ug/min | INTRAVENOUS | Status: DC
  Administered 2012-11-17: 25 ug/min via INTRAVENOUS

## 2012-11-17 NOTE — Progress Notes (Signed)
Patient was placed on 5/5 this am. At 8:30 patient self extubated, suctioned mouth out throughly. Patient placed on [facility]. HR 43 97% 103/59. RT will continue to montor

## 2012-11-17 NOTE — Progress Notes (Signed)
UR Completed.  Jonathan Perez [name] [phone] 01/08/2012  

## 2012-11-17 NOTE — Progress Notes (Addendum)
NURSING PROGRESS NOTE  Jonathan Perez [id number] Transfer Data: 11/17/2012 6:53 PM Attending Provider: Leslye Peer, MD WUJ:[id number], MD Code Status:full   Jonathan Perez is a 36 y.o. male patient transferred from 2100 Pt states pain 10/10 back No c/o shortness of breath, no c/o chest pain.  Cardiac tele # [phone], in place, cardiac monitor yields:sinus bradycardia.  Blood pressure 99/46, pulse 50, temperature 99.3 F (37.4 C), temperature source Core (Comment), resp. rate 15, height 5\' 8"  (1.727 m), weight 101.6 kg (223 lb 15.8 oz), SpO2 100.00%.   IV Fluids:  IV in place, occlusive dsg intact without redness, IV cath hand left, condition patent and no redness and left ac both are saline locked   Allergies:  Reglan  Past Medical History:   has a past medical history of Chronic back pain; Kidney stones; Kidney stones; and ADD (attention deficit disorder).  Past Surgical History:   has past surgical history that includes Back surgery and Hand surgery.  Social History:   reports that he has been smoking Cigarettes.  He has been smoking about .5 packs per day. He does not have any smokeless tobacco history on file. He reports that he does not drink alcohol or use illicit drugs.  Skin: dry and intact  Orientation to room, and floor  SR up x 2 Call light within reach. Pt has safety sitter bedside Will cont to eval and treat per MD orders.  Rosalie Doctor, RN

## 2012-11-17 NOTE — ED Provider Notes (Signed)
I have personally seen and examined the patient.  I have discussed the plan of care with the resident.  I have reviewed the documentation on PMH/FH/Soc. History.  I have reviewed the documentation of the resident and agree.  Doug Sou, MD 11/17/12 1128

## 2012-11-17 NOTE — H&P (Addendum)
Name: Jonathan Perez MRN: [id number] DOB: 03/18/1976    LOS: 1  PULMONARY / CRITICAL CARE MEDICINE  HPI:   36 year old male with PMH relevant for chronic back pain post multiple back surgeries and instrumentation. He uses narcotics chronically.  He also has history of kidney stones and ADD. Today at 19:50 the patient ingested 119 tablets of Zanaflex (Tizanidine). He became somnolent and EMS was called by his wife. He was found bradycardic and transcutaneous pacing was started. At arrival to the ED lethargic but arousable. Bradycardic (sinus) in the 40's and hypertensive. Poison control called and supportive management was recommended. His ental status deteriorated and was unable to protect his airway. He was intubated in the ED. Hypertensive with systolic in the 200's. NTG drip started.   Past Medical History  Diagnosis Date  . Chronic back pain   . Kidney stones   . Kidney stones   . ADD (attention deficit disorder)    Past Surgical History  Procedure Date  . Back surgery   . Hand surgery    Prior to Admission medications   Medication Sig Start Date End Date Taking? Authorizing Provider  amphetamine-dextroamphetamine (ADDERALL) 30 MG tablet Take 30 mg by mouth 2 (two) times daily.   Yes Historical Provider, MD  clonazePAM (KLONOPIN) 2 MG tablet Take 2 mg by mouth 2 (two) times daily as needed. sleep   Yes Historical Provider, MD  ondansetron (ZOFRAN) 4 MG tablet Take 4 mg by mouth every 6 (six) hours as needed. For nausea   Yes Historical Provider, MD  SUMAtriptan (IMITREX) 6 MG/0.5ML SOLN injection Inject 6 mg into the skin every 2 (two) hours as needed. for migraines.   Yes Historical Provider, MD   Allergies Allergies  Allergen Reactions  . Reglan (Metoclopramide) Itching    Family History Family History  Problem Relation Age of Onset  . Diabetes Other   . Stroke Other    Social History  reports that he has been smoking Cigarettes.  He has been smoking about .5 packs per  day. He does not have any smokeless tobacco history on file. He reports that he does not drink alcohol or use illicit drugs.  Review Of Systems:  Unable to provide.   Vital Signs: Temp:  [97.5 F (36.4 C)-99.1 F (37.3 C)] 98.4 F (36.9 C) (12/09 0002) Pulse Rate:  [30-46] 30  (12/09 0002) Resp:  [8-24] 16  (12/09 0002) BP: (121-247)/(77-132) 147/95 mmHg (12/09 0002) SpO2:  [95 %-100 %] 100 % (12/09 0002) FiO2 (%):  [70 %] 70 % (12/08 2305)  Physical Examination: General:  Intubated, mechanically ventilated, no acute distress Neuro:  Sedated, synchronous, nonfocal HEENT:  PERRL, pink conjunctivae, moist membranes Neck:  Supple, no JVD   Cardiovascular:  RRR, bradycardic, no M/R/G. Peripheral pulses present and symmetric. Lungs:  CTA, no W/R/R Abdomen:  Soft, nontender, nondistended, bowel sounds present Musculoskeletal:  Moves all extremities, no pedal edema Skin:  No rash    ASSESSMENT AND PLAN  PULMONARY  Lab 11/16/12 2110  PHART 7.387  PCO2ART 32.7*  PO2ART 95.0  HCO3 19.7*  O2SAT 97.0   Ventilator Settings: Vent Mode:  [-] PRVC FiO2 (%):  [70 %] 70 % Set Rate:  [14 bmp] 14 bmp Vt Set:  [550 mL] 550 mL PEEP:  [5 cmH20] 5 cmH20 Plateau Pressure:  [22 cmH20] 22 cmH20 CXR:  No acute infiltrates ETT:  Adequate position  A:   1) Acute respiratory failure due to drug overdoes and  inability to protect his airway. P:   - Mechanical ventilation  - PRVC, Vt: 8cc/kg, PEEP: 5, RR: 16, FiO2 100% and adjust to keep O2 sat >96% - VAP prevention order set.  CARDIOVASCULAR  Lab 11/16/12 2158  TROPONINI --  LATICACIDVEN 3.1*  PROBNP --   ECG:  Sinus bradycardia Lines: Peripheral lines  A:  1) Sinus bradycardia due to Tizanidine overdose 2) Hypertensive P:  - Continuous monitoring - Will continue NTG drip - Atropine PRN  - Pads for transcutaneous pacing in place - Will consider cardiology consult for temporary pacing if signs of hypoperfusion  RENAL  Lab  11/16/12 2325 11/16/12 2157  NA -- 131*  K -- 5.3*  CL -- 96  CO2 -- 22  BUN -- 13  CREATININE 1.04 1.26  CALCIUM -- 9.1  MG -- --  PHOS -- --   Intake/Output      12/08 0701 - 12/09 0700   I.V. 500   Total Intake 500   Net +500        Foley:  11/15/12  A:   1) Mild hyperkalemia.  P:   - We will repeat BMP and correct if necessary. - NS 0.9% at 100cc/hr  GASTROINTESTINAL  Lab 11/16/12 2157  AST 33  ALT 28  ALKPHOS 102  BILITOT 1.0  PROT 7.1  ALBUMIN 3.6    A:   1) No issues P:   - GI prophylaxis with IV protonix  HEMATOLOGIC  Lab 11/16/12 2325 11/16/12 2157  HGB 15.3 17.1*  HCT 43.4 48.2  PLT 340 384  INR -- --  APTT -- --   A:   1) No issues  INFECTIOUS  Lab 11/16/12 2325 11/16/12 2157  WBC 16.0* 18.0*  PROCALCITON -- --    A:   1) No evidence of acute infection P:   - No antibiotics  ENDOCRINE No results found for this basename: GLUCAP:5 in the last 168 hours A:   1) No issues     NEUROLOGIC  A:   1) Altered mental status secondary to Tizanidine overdose P:   - Intubated - Will order intermittent sedation with Versed PRN - Pain management with Fentanyl PRN  BEST PRACTICE / DISPOSITION - Level of Care:  ICU - Primary Service:  PCCM - Consultants:  None - Code Status:  Full code - Diet:  NPO - DVT Px:  Heparin - GI Px:  Protonix - Skin Integrity:  Intact - Social / Family:  Family updated at bedside.  The patient is critically ill with multiple organ systems failure and requires high complexity decision making for assessment and support, frequent evaluation and titration of therapies, application of advanced monitoring technologies and extensive interpretation of multiple databases.   Critical Care Time devoted to patient care services described in this note is: 1 Hour  Overton Mam, M.D. Pulmonary and Critical Care Medicine [facility] Pager: [phone]  11/17/2012, 12:30 AM

## 2012-11-17 NOTE — Progress Notes (Signed)
eLink Physician-Brief Progress Note Patient Name: Jonathan Perez DOB: [birth year] MRN: [id number]  Date of Service  11/17/2012   HPI/Events of Note  Patient admitted to ICU intubated following zanaflex overdose.  Issues with bradycardia - TCP done by EMS.  HR has been in the mid 30 to 40s but dropped 25.  BP slowly decreaseing and NTG stopped.     eICU Interventions  Plan: 1 time dose of atropine with increase in HR to the 50s to 60s.   Intervention Category Major Interventions: Arrhythmia - evaluation and management  DETERDING,ELIZABETH 11/17/2012, 2:00 AM

## 2012-11-17 NOTE — Progress Notes (Signed)
Name: Jonathan Perez MRN: [id number] DOB: Jan 01, 1976    LOS: 1  PULMONARY / CRITICAL CARE MEDICINE  Brief description:  36yo male with hx chronic pain, narcotic abuse admitted 12/8 after ingesting 119 tablets Zanaflex (Tizanidine).  HTN, bradycardic, hypertensive, intubated in ER for airway protection r/t AMS, started on nitro gtt and PCCM admitted.   Tubes/Lines: ETT 12/8>>>12/9 (self extubated)   Micro:  Urine 12/9>>>  Abx:  None   Consults:  Psych (called 12/9)  Significant events/ studies:   Subjective/Overnight:  Self extubated this am.  C/o pain "all over".  Admits that OD was intentional.  He is "tired of living in pain all the time".    Vital Signs: Temp:  [97.5 F (36.4 C)-99.3 F (37.4 C)] 99.3 F (37.4 C) (12/09 1000) Pulse Rate:  [30-60] 41  (12/09 1000) Resp:  [8-36] 14  (12/09 1000) BP: (98-247)/(56-132) 117/69 mmHg (12/09 1000) SpO2:  [95 %-100 %] 100 % (12/09 1000) FiO2 (%):  [30 %-70 %] 30 % (12/09 0819) Weight:  [223 lb 15.8 oz (101.6 kg)] 223 lb 15.8 oz (101.6 kg) (12/09 0500)   Intake/Output Summary (Last 24 hours) at 11/17/12 1144 Last data filed at 11/17/12 1000  Gross per 24 hour  Intake 2464.93 ml  Output   1130 ml  Net 1334.93 ml     Physical Examination: General: young male, NAD  Neuro: awake, alert, MAe CV:  s1s2 rrr, brady  PULM: resps even non labored on [place], CTA GI: abd soft, +bs Extremities:  Warm and dry, no edema    ABG    Component Value Date/Time   PHART 7.453* 11/17/2012 0500   PCO2ART 29.7* 11/17/2012 0500   PO2ART 105.0* 11/17/2012 0500   HCO3 20.6 11/17/2012 0500   TCO2 21.5 11/17/2012 0500   ACIDBASEDEF 2.8* 11/17/2012 0500   O2SAT 98.7 11/17/2012 0500     Radiology_-- Ct Head Wo Contrast  11/16/2012  *RADIOLOGY REPORT*  Clinical Data: Overdose.Hypertensive emergency.  CT HEAD WITHOUT CONTRAST  Technique:  Contiguous axial images were obtained from the base of the skull through the vertex without contrast.   Comparison: MRI brain 07/11/2012.  Findings: No mass lesion, mass effect, midline shift, hydrocephalus, hemorrhage.  No territorial ischemia or acute infarction.  Paranasal sinuses appear within normal limits.  Study was discussed with Dr. Ethelda Chick at the time of dictation.  IMPRESSION: Negative CT head.   Original Report Authenticated By: Andreas Newport, M.D.    Dg Chest Portable 1 View  11/16/2012  *RADIOLOGY REPORT*  Clinical Data: Drug overdose.  PORTABLE CHEST - 1 VIEW  Comparison: 11/16/2012, 2113 hours.  Findings: Endotracheal tube tip is 35 mm from the carina, in good position.  Harrington rods are again noted.  Defibrillator pads are present over the chest.  Mild left basilar atelectasis.  No airspace disease.  No effusion. Monitoring leads are projected over the chest.  The cardiopericardial silhouette and mediastinal contours appear within normal limits.  IMPRESSION: Interval intubation with the endotracheal tube tip 35 mm from the carina.  No other interval change.   Original Report Authenticated By: Andreas Newport, M.D.    Dg Chest Portable 1 View  11/16/2012  *RADIOLOGY REPORT*  Clinical Data: Shortness of breath.  Decreased heart rate.  PORTABLE CHEST - 1 VIEW  Comparison: 03/07/2011  Findings: Shallow inspiration.  Normal heart size and pulmonary vascularity.  No focal airspace consolidation in the lungs.  No blunting of costophrenic angles.  No pneumothorax.  Mediastinal contours appear intact.  Postoperative changes in the thoracolumbar spine.  No significant change since previous study.  IMPRESSION: No evidence of active pulmonary disease.   Original Report Authenticated By: Burman Nieves, M.D.      Lab 11/17/12 0429 11/16/12 2325 11/16/12 2157  HGB 15.3 15.3 17.1*  HCT 44.1 43.4 48.2  WBC 13.4* 16.0* 18.0*  PLT 303 340 384    Lab 11/17/12 0429 11/17/12 0048 11/16/12 2325 11/16/12 2157  NA 135 133* -- 131*  K 4.0 4.6 -- --  CL 104 102 -- 96  CO2 19 20 -- 22  GLUCOSE 116*  194* -- 323*  BUN 14 14 -- 13  CREATININE 0.95 1.13 1.04 1.26  CALCIUM 8.8 8.7 -- 9.1  MG -- -- -- --  PHOS -- -- -- --     ASSESSMENT AND PLAN  PULMONARY   A:   1) Acute respiratory failure due to drug overdoes and inability to protect his airway. P:   -Self extubated 12/9 AM  -appears to be tol well, resp status stable  - no further resp needs anticipated  - pulm hygiene   CARDIOVASCULAR ECG:  Sinus bradycardia Lines: Peripheral lines  A:  1) Sinus bradycardia due to Tizanidine overdose 2) Hypertensive - resolved. Nitro gtt off.  P:  - Continuous monitoring - Atropine PRN  - Will consider cardiology consult for temporary pacing if signs of hypoperfusion - not indicated at this time    RENAL  Foley:  11/15/12  A:   1) Mild hyperkalemia - resolved.  P:   -KVO IVF  -f/u chem    GASTROINTESTINAL  A:   1) No issues P:   - GI prophylaxis with protonix  HEMATOLOGIC  A:   1) No issues  INFECTIOUS  A:   1) No evidence of acute infection P:   - No antibiotics  ENDOCRINE A:   1) No issues     NEUROLOGIC  A:   Altered mental status secondary to Tizanidine overdose - resolved  Intentional OD  Chronic pain  P:   -Psych to see - admits this was intentional OD  -Will cont low dose PRN fentanyl as BP tolerates  -cont safety sitter    BEST PRACTICE / DISPOSITION - Level of Care:  ICU  - Primary Service:  PCCM - Consultants:  None - Code Status:  Full code - Diet:  NPO - DVT Px:  Heparin - GI Px:  Protonix - Skin Integrity:  Intact - Social / Family:  No family available    Will cont to monitor in ICU this am as he just self extubated, but appears much improved - likely tx to tele and give to [facility] this afternoon.   Danford Bad, NP 11/17/2012  11:41 AM Pager: [phone] or [phone]  *Care during the described time interval was provided by me and/or other providers on the critical care team. I have reviewed this  patient's available data, including medical history, events of note, physical examination and test results as part of my evaluation.  Levy Pupa, MD, PhD 11/17/2012, 2:57 PM [facility] [phone] or if no answer [phone]

## 2012-11-17 NOTE — Progress Notes (Signed)
Pt self extubated despite safety mitts in  place; Respiratory Therapy at bedside, placed pt on 2L [place]; pt able to cough and clear secretions; CCM paged; pt alert and oriented asking for pain medications.  Will continue to monitor.  Burnard Bunting, RN

## 2012-11-17 NOTE — Progress Notes (Signed)
Clinical Social Work Department CLINICAL SOCIAL WORK PSYCHIATRY SERVICE LINE ASSESSMENT 11/17/2012  Patient:  Jonathan Perez  Account:  [id number]  Admit Date:  11/16/2012  Clinical Social Worker:  Unk Lightning, LCSW  Date/Time:  11/17/2012 03:45 PM Referred by:  Physician  Date referred:  11/17/2012 Reason for Referral  Behavioral Health Issues   Presenting Symptoms/Problems (In the person's/family's own words):   "I took a whole bunch of pills. I'm tired of always being in pain."   Abuse/Neglect/Trauma History (check all that apply)  Denies history   Abuse/Neglect/Trauma Comments:   Psychiatric History (check all that apply)  Outpatient treatment   Psychiatric medications:  Patient reports he takes Klonopin to help him sleep and Adderall for ADD.   Current Mental Health Hospitalizations/Previous Mental Health History:   Patient denies hospitalizations. Patient reports he has been seeing a psychiatrist for the past couple of years for medication management. No counseling.   Current provider:   Dr. Jeannine Kitten through [facility]   Place and Date:   Appointment every 2 months.   Current Medications:   sodium chloride, fentaNYL, [DISCONTINUED] fentaNYL, [DISCONTINUED] midazolam                        . antiseptic oral rinse  15 mL Mouth Rinse q12n4p  . [COMPLETED] atropine      . [COMPLETED] atropine      . chlorhexidine  15 mL Mouth Rinse BID  . [COMPLETED] etomidate      . heparin  5,000 Units Subcutaneous Q8H  . [EXPIRED] lidocaine (cardiac) 100 mg/50ml      . [COMPLETED] nitroGLYCERIN  2-200 mcg/min Intravenous Once  . pantoprazole (PROTONIX) IV  40 mg Intravenous QHS  . [EXPIRED] rocuronium      . [COMPLETED] succinylcholine      . [COMPLETED] vecuronium  15 mg Intravenous Once  . [DISCONTINUED] LORazepam  4 mg Intravenous Once   Previous Impatient Admission/Date/Reason:   None reported   Emotional Health / Current Symptoms    Suicide/Self  Harm  Suicide attempt in past (date/description)   Suicide attempt in the past:   Patient admitted for ingesting several pills. Patient reports he could not handle the pain anymore and decided he wanted "it all to end." Patient reports he is unsure if he still feels suicidal. Patient reports if he left the hospital today he cannot be sure that he would not attempt to harm himself again. Patient has had SI in the past but reports this is his first attempt.   Other harmful behavior:   None reported   Psychotic/Dissociative Symptoms  None reported   Other Psychotic/Dissociative Symptoms:    Attention/Behavioral Symptoms  Restless   Other Attention / Behavioral Symptoms:    Cognitive Impairment  Within Normal Limits   Other Cognitive Impairment:    Mood and Adjustment  Anxious    Stress, Anxiety, Trauma, Any Recent Loss/Stressor  Other - See comment   Anxiety (frequency):   Phobia (specify):   Compulsive behavior (specify):   Obsessive behavior (specify):   Other:   Patient reports he broke his back three times and has steel rods placed in back. Patient reports constant pain since 2005.   Substance Abuse/Use  None   SBIRT completed (please refer for detailed history):  N  Self-reported substance use:   Patient denies all substance use.   Urinary Drug Screen Completed:  Y Alcohol level:   Positive for Benzodiazepines and Opiates  Environmental/Housing/Living Arrangement  Stable housing   Who is in the home:   Wife   Emergency contact:  Sports coach  Medicare  Medicaid   Patient's Strengths and Goals (patient's own words):   "I don't have any"    Patient has been compliant with appointments with psychiatrist. Patient has stable housing and support from wife.   Clinical Social Worker's Interpretive Summary:   CSW received referral due to patient being admitted for OD. CSW reviewed chart and met with patient at bedside. No family present.  Sitter present.    CSW introduced myself and explained role. Patient reports that he was admitted yesterday after taking several pills. Patient reports that his wife called 911 and he was brought to the hospital. Patient reports that he was in terrible back pain and just thought to end his life. Patient reports that he has had back pain since 2005 and has SI in the past but never had a plan. Patient is unable to state if he has any SI or HI at this time. Patient cannot contract for safety at this time.    Patient is married and has a 38 year old son.  Patient does not feel son will be affected by suicide attempt. Wife also has kids from another marriage. Patient reports that he has been seeing a psychiatrist for ADD and depression for a couple of years. Patient reports that he is compliant with medications. Patient has appointment with psychiatrist every two months. Patient does not attend counseling and feels it is not beneficial. Although originally patient states that his attempt was due to pain, he later states that there are other stressors in his life. Patient reports it is not necessary to discuss stressors because talking about it does not change the situation. CSW spoke with patient regarding developing positive coping skills in order to reduce stress and the benefits of counseling to assist with managing stressors. Patient refused and reports no assistance is needed. Patient is unable to identify any relaxation techniques and is unable to identify supports to assist with difficult times.  Patient denies any substance use and reports no substances were involved with suicide attempt.    Patient minimally engaged throughout assessment with little to no eye contact. Patient is short with answers and does not desire to elaborate. Patient appears to be impulsive and struggles with reflecting on events leading to suicide attempt. Patient is angry in regards to chronic back pain and expresses grief that  pain will never resolve. Patient struggles to identify stressors and refuses to accept assistance with depression. Patient has a solution-focused mindset in regards to reducing stress and eliminating SI but is unable to identify any techniques to solve the problem. Patient is unable to determine if he has any SI. Patient reports he would attempt to kill himself by OD again if he felt he wanted to die.    CSW will staff case with psych MD and will follow recommendations.   Disposition:  Recommend Psych CSW continuing to support while in hospital

## 2012-11-17 NOTE — Progress Notes (Signed)
11/16/12 0900  Clinical Encounter Type  Visited With Family  Visit Type Trauma   Chaplain spent a few hours with the patient's mother and wife during his treatment in ED. Follow up with patient in 2100 unit. Eugenio Hoes DRaper

## 2012-11-18 ENCOUNTER — Inpatient Hospital Stay (HOSPITAL_COMMUNITY): Payer: Medicare Other

## 2012-11-18 DIAGNOSIS — T50992A Poisoning by other drugs, medicaments and biological substances, intentional self-harm, initial encounter: Secondary | ICD-10-CM

## 2012-11-18 DIAGNOSIS — E876 Hypokalemia: Secondary | ICD-10-CM

## 2012-11-18 DIAGNOSIS — T481X4A Poisoning by skeletal muscle relaxants [neuromuscular blocking agents], undetermined, initial encounter: Principal | ICD-10-CM

## 2012-11-18 DIAGNOSIS — T50904A Poisoning by unspecified drugs, medicaments and biological substances, undetermined, initial encounter: Secondary | ICD-10-CM

## 2012-11-18 DIAGNOSIS — R001 Bradycardia, unspecified: Secondary | ICD-10-CM | POA: Diagnosis present

## 2012-11-18 DIAGNOSIS — T50901A Poisoning by unspecified drugs, medicaments and biological substances, accidental (unintentional), initial encounter: Secondary | ICD-10-CM

## 2012-11-18 DIAGNOSIS — D72829 Elevated white blood cell count, unspecified: Secondary | ICD-10-CM | POA: Diagnosis present

## 2012-11-18 DIAGNOSIS — G8929 Other chronic pain: Secondary | ICD-10-CM

## 2012-11-18 DIAGNOSIS — I498 Other specified cardiac arrhythmias: Secondary | ICD-10-CM

## 2012-11-18 LAB — BASIC METABOLIC PANEL
BUN: 10 mg/dL (ref 6–23)
Creatinine, Ser: 1 mg/dL (ref 0.50–1.35)
GFR calc Af Amer: >90 mL/min (ref >90)
GFR calc non Af Amer: >90 mL/min (ref >90)

## 2012-11-18 LAB — CBC
HCT: 41.1 % (ref 39.0–52.0)
MCH: 31.2 pg (ref 26.0–34.0)
MCHC: 34.3 g/dL (ref 30.0–36.0)
MCV: 90.9 fL (ref 78.0–100.0)
RDW: 13.4 % (ref 11.5–15.5)

## 2012-11-18 LAB — URINE CULTURE
Culture: NO GROWTH
Culture: NO GROWTH
Special Requests: NORMAL

## 2012-11-18 MED ORDER — CLONAZEPAM 0.5 MG PO TABS
0.5000 mg | ORAL_TABLET | Freq: Once | ORAL | Status: AC
  Administered 2012-11-18: 0.5 mg via ORAL
  Filled 2012-11-18: qty 1

## 2012-11-18 MED ORDER — ZOLPIDEM TARTRATE 5 MG PO TABS
5.0000 mg | ORAL_TABLET | Freq: Once | ORAL | Status: AC
  Administered 2012-11-18: 5 mg via ORAL
  Filled 2012-11-18: qty 1

## 2012-11-18 MED ORDER — PANTOPRAZOLE SODIUM 40 MG PO TBEC
40.0000 mg | DELAYED_RELEASE_TABLET | Freq: Every day | ORAL | Status: DC
  Administered 2012-11-18 – 2012-11-19 (×2): 40 mg via ORAL
  Filled 2012-11-18 (×2): qty 1

## 2012-11-18 MED ORDER — MORPHINE SULFATE ER 30 MG PO TBCR
30.0000 mg | EXTENDED_RELEASE_TABLET | Freq: Two times a day (BID) | ORAL | Status: DC
  Administered 2012-11-18 – 2012-11-19 (×2): 30 mg via ORAL
  Filled 2012-11-18 (×2): qty 1

## 2012-11-18 MED ORDER — HYDROMORPHONE HCL PF 1 MG/ML IJ SOLN
2.0000 mg | INTRAMUSCULAR | Status: DC | PRN
  Administered 2012-11-18 – 2012-11-19 (×7): 2 mg via INTRAVENOUS
  Filled 2012-11-18 (×7): qty 2

## 2012-11-18 MED ORDER — DULOXETINE HCL 30 MG PO CPEP
30.0000 mg | ORAL_CAPSULE | Freq: Every day | ORAL | Status: DC
  Administered 2012-11-18 – 2012-11-19 (×2): 30 mg via ORAL
  Filled 2012-11-18 (×2): qty 1

## 2012-11-18 MED ORDER — POTASSIUM CHLORIDE CRYS ER 20 MEQ PO TBCR
40.0000 meq | EXTENDED_RELEASE_TABLET | ORAL | Status: AC
  Administered 2012-11-18 (×3): 40 meq via ORAL
  Filled 2012-11-18 (×3): qty 2

## 2012-11-18 NOTE — Progress Notes (Signed)
Clinical Social Work  CSW staffed case with psych MD who reports that patient will need to be set up with outpatient services at dc. CSW attempted to meet with patient but patient out of room for procedure. CSW will continue to follow.  [place], [place] [phone]
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
## 2012-11-18 NOTE — Progress Notes (Signed)
Patient heart rate ranging 38-48 beats/min.Md notified-Dr [place],no new order given,Pt place on continous pulse oximetry for precaution.

## 2012-11-18 NOTE — Progress Notes (Addendum)
MEDICATION RELATED CONSULT NOTE - INITIAL   Pharmacy consulted to find out what pain medications this patient was taking at home PTA.  Spoke with patient about his medications. He states his pain medications do not completely take away his pain and he has learned to live with a certain threshold of pain. He should not be out of his pain medications based on the directions and last fill dates.  He states he takes the following: oxycodone IR 30 mg po q4h (scheduled) Oxymorphone ER 40 mg po q8h Adderall 30 mg po bid Clonazepam 2 mg po bid (scheduled) tizanidine 4 mg po bid prn muscle spasms  Pharmacy record and bottle directions: [facility] Buspirone 30 mg tid, #90, last filled 11/18 topiramate 200 mg po tid, #90, last filled 11/18 Oxymorphone ER 40 mg po q8h, #90, last filled 11/26 Clonazepam 2 mg po qhs, #30, last filled 11/18 tizanidine 4 mg tabs 0.5-1 tab po qid prn, #120, last filled 10/25  [facility] Oxycodone IR 30 mg po every 4 to 6 hrs prn (no more than 5 tabs daily), #150, last filled 11/24 Adderall 30 mg po bid, #60, last filled 11/21   The above medications were filled by two physicians, Dr. Malvin Johns and Dr. Rosario Jacks.  Please let me know if pharmacy can be of any more help.  [place], [address].D., BCPS Clinical Pharmacist Pager: [phone] 11/18/2012 2:58 PM
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
## 2012-11-18 NOTE — Progress Notes (Signed)
eLink Physician-Brief Progress Note Patient Name: Jonathan Perez DOB: 1976-12-07 MRN: [id number]  Date of Service  11/18/2012   HPI/Events of Note   insomnia  eICU Interventions  Klonopin x 1 x 0.5mg    Intervention Category Minor Interventions: Other:  Jonathan Perez 11/18/2012, 1:02 AM

## 2012-11-18 NOTE — Progress Notes (Addendum)
[facility] PROGRESS NOTE  Jonathan Perez QMV:[id number] DOB: 15-Dec-1975 DOA: 11/16/2012 PCP: Sheila Oats, MD  Assessment/Plan: 1-Acute resp failure due to OD: now extubated, no complaining of SOB, no CP. Patient AAOX3; protecting airways and with good O2 sat on RA.  2-Chronic pain: patient with hx of back pain (chronic); out of meds prior to incident of OD. Per patient reports is worse than ever.  -Will check CT thoracic and Lumbar spine  -will start PRN dilaudid for better control. -start Ms contin BID for basal pain coverage. -Pharmacy to help with med rec to assess home regimen for pain control as an outpatient.  3-Depression/OD: per psych. -continue sitter -started on cymbalta -will follow further rec's,  4-Bradycardia: sinus and most likely secondary to OD with muscle relaxant. Will follow for now, if sustained or asymptomatic will ask cardiology to see. Replete electrolytes.  5-Hypokalemia: will replete and check a Mg level.  Code Status: Full Family Communication: no family at bedside Disposition Plan: depression and overdose; needs to be clear by Psych prior to discharge. Patient wants to go home when medically stable.   Consultants:  CCM  Procedures:  CT thoracic and lumbar spine (ordered but pending)  HPI/Subjective: Afebrile. Sinus bradycardia ongoing. Breathing comfortable and only complaining of severe back pain (worse than before; its a chronic pain).  Objective: Filed Vitals:   11/17/12 1800 11/17/12 2039 11/18/12 0516 11/18/12 1127  BP: 121/78 130/79 132/81 157/90  Pulse: 48 52 51 44  Temp: 98.3 F (36.8 C) 98.3 F (36.8 C) 98.5 F (36.9 C) 98.2 F (36.8 C)  TempSrc: Oral   Oral  Resp: 16 20 18 18   Height:      Weight:   96.571 kg (212 lb 14.4 oz)   SpO2: 99% 100% 99% 99%    Intake/Output Summary (Last 24 hours) at 11/18/12 1215 Last data filed at 11/18/12 0946  Gross per 24 hour  Intake    580 ml  Output    175 ml  Net    405  ml   Filed Weights   11/17/12 0100 11/17/12 0500 11/18/12 0516  Weight: 101.6 kg (223 lb 15.8 oz) 101.6 kg (223 lb 15.8 oz) 96.571 kg (212 lb 14.4 oz)    Exam:   General:  No fever no CP, breathing comfortable; complaining of severe back pain  Cardiovascular: sinus bradycardia, no rubs or gallops  Respiratory: CTA bilaterally  Abdomen: soft, NT, ND, positive BS  Extremities: no edema, no cyanosis  Neuro: AAOX3, non focal.  Data Reviewed: Basic Metabolic Panel:  Lab 11/18/12 2841 11/17/12 0429 11/17/12 0048 11/16/12 2325 11/16/12 2157  NA 138 135 133* -- 131*  K 3.1* 4.0 4.6 -- 5.3*  CL 103 104 102 -- 96  CO2 25 19 20  -- 22  GLUCOSE 87 116* 194* -- 323*  BUN 10 14 14  -- 13  CREATININE 1.00 0.95 1.13 1.04 1.26  CALCIUM 8.9 8.8 8.7 -- 9.1  MG 1.7 -- -- -- --  PHOS 2.5 -- -- -- --   Liver Function Tests:  Lab 11/16/12 2157  AST 33  ALT 28  ALKPHOS 102  BILITOT 1.0  PROT 7.1  ALBUMIN 3.6   CBC:  Lab 11/18/12 0530 11/17/12 0429 11/16/12 2325 11/16/12 2157  WBC 11.4* 13.4* 16.0* 18.0*  NEUTROABS -- -- -- 14.8*  HGB 14.1 15.3 15.3 17.1*  HCT 41.1 44.1 43.4 48.2  MCV 90.9 91.5 91.4 92.0  PLT 229 303 340 384   CBG:  Lab  11/17/12 0138  GLUCAP 160*    Recent Results (from the past 240 hour(s))  MRSA PCR SCREENING     Status: Normal   Collection Time   11/17/12  1:25 AM      Component Value Range Status Comment   MRSA by PCR NEGATIVE  NEGATIVE Final      Studies: Ct Head Wo Contrast  11/16/2012  *RADIOLOGY REPORT*  Clinical Data: Overdose.Hypertensive emergency.  CT HEAD WITHOUT CONTRAST  Technique:  Contiguous axial images were obtained from the base of the skull through the vertex without contrast.  Comparison: MRI brain 07/11/2012.  Findings: No mass lesion, mass effect, midline shift, hydrocephalus, hemorrhage.  No territorial ischemia or acute infarction.  Paranasal sinuses appear within normal limits.  Study was discussed with Dr. Ethelda Chick at the  time of dictation.  IMPRESSION: Negative CT head.   Original Report Authenticated By: Andreas Newport, M.D.    Dg Chest Portable 1 View  11/16/2012  *RADIOLOGY REPORT*  Clinical Data: Drug overdose.  PORTABLE CHEST - 1 VIEW  Comparison: 11/16/2012, 2113 hours.  Findings: Endotracheal tube tip is 35 mm from the carina, in good position.  Harrington rods are again noted.  Defibrillator pads are present over the chest.  Mild left basilar atelectasis.  No airspace disease.  No effusion. Monitoring leads are projected over the chest.  The cardiopericardial silhouette and mediastinal contours appear within normal limits.  IMPRESSION: Interval intubation with the endotracheal tube tip 35 mm from the carina.  No other interval change.   Original Report Authenticated By: Andreas Newport, M.D.    Dg Chest Portable 1 View  11/16/2012  *RADIOLOGY REPORT*  Clinical Data: Shortness of breath.  Decreased heart rate.  PORTABLE CHEST - 1 VIEW  Comparison: 03/07/2011  Findings: Shallow inspiration.  Normal heart size and pulmonary vascularity.  No focal airspace consolidation in the lungs.  No blunting of costophrenic angles.  No pneumothorax.  Mediastinal contours appear intact.  Postoperative changes in the thoracolumbar spine.  No significant change since previous study.  IMPRESSION: No evidence of active pulmonary disease.   Original Report Authenticated By: Burman Nieves, M.D.     Scheduled Meds:   . antiseptic oral rinse  15 mL Mouth Rinse q12n4p  . chlorhexidine  15 mL Mouth Rinse BID  . [COMPLETED] clonazePAM  0.5 mg Oral Once  . DULoxetine  30 mg Oral Daily  . heparin  5,000 Units Subcutaneous Q8H  . pantoprazole (PROTONIX) IV  40 mg Intravenous QHS  . potassium chloride  40 mEq Oral Q4H    Time spent: >30 minutes    Jonathan Perez  [facility] Pager [phone]. If 8PM-8AM, please contact night-coverage at [email or web address], password [facility] 11/18/2012, 12:15 PM  LOS: 2 days

## 2012-11-19 LAB — CBC
Hemoglobin: 14 g/dL (ref 13.0–17.0)
MCH: 31.7 pg (ref 26.0–34.0)
MCHC: 35.1 g/dL (ref 30.0–36.0)
MCV: 90.5 fL (ref 78.0–100.0)

## 2012-11-19 LAB — BASIC METABOLIC PANEL
BUN: 9 mg/dL (ref 6–23)
Calcium: 8.9 mg/dL (ref 8.4–10.5)
GFR calc non Af Amer: 80 mL/min — ABNORMAL LOW (ref >90)
Glucose, Bld: 91 mg/dL (ref 70–99)
Potassium: 3.5 mEq/L (ref 3.5–5.1)

## 2012-11-19 LAB — MAGNESIUM: Magnesium: 1.8 mg/dL (ref 1.5–2.5)

## 2012-11-19 MED ORDER — OXYMORPHONE HCL ER 40 MG PO TB12: 40.0000 mg | ORAL_TABLET | Freq: Three times a day (TID) | ORAL | Status: DC

## 2012-11-19 MED ORDER — INFLUENZA VIRUS VACC SPLIT PF IM SUSP
0.5000 mL | Freq: Once | INTRAMUSCULAR | Status: AC
  Administered 2012-11-19: 0.5 mL via INTRAMUSCULAR
  Filled 2012-11-19: qty 0.5

## 2012-11-19 MED ORDER — PNEUMOCOCCAL VAC POLYVALENT 25 MCG/0.5ML IJ INJ
0.5000 mL | INJECTION | Freq: Once | INTRAMUSCULAR | Status: AC
  Administered 2012-11-19: 0.5 mL via INTRAMUSCULAR
  Filled 2012-11-19: qty 0.5

## 2012-11-19 MED ORDER — INFLUENZA VIRUS VACC SPLIT PF IM SUSP: 0.5000 mL | INTRAMUSCULAR | Status: DC

## 2012-11-19 MED ORDER — OXYCODONE HCL 30 MG PO TABS
30.0000 mg | ORAL_TABLET | ORAL | Status: DC
Start: 2012-11-19 — End: 2013-03-16

## 2012-11-19 MED ORDER — DULOXETINE HCL 60 MG PO CPEP: 60.0000 mg | ORAL_CAPSULE | Freq: Every day | ORAL | Status: DC

## 2012-11-19 MED ORDER — PNEUMOCOCCAL VAC POLYVALENT 25 MCG/0.5ML IJ INJ: 0.5000 mL | INJECTION | INTRAMUSCULAR | Status: DC

## 2012-11-19 NOTE — Progress Notes (Signed)
Pt. discharged to floor,verbalized understanding of discharged instruction,medication,restriction,diet and follow up appointment.Baseline Vitals sign stable,Pt comfortable,no sign and symptom of distress. 

## 2012-11-19 NOTE — Progress Notes (Signed)
Clinical Social Work Progress Note PSYCHIATRY SERVICE LINE 11/19/2012  Patient:  Jonathan Perez  Account:  [id number]  Admit Date:  11/16/2012  Clinical Social Worker:  Unk Lightning, LCSW  Date/Time:  11/19/2012 09:00 AM  Review of Patient  Overall Medical Condition:   "My back is still hurting but I feel better overall."   Participation Level:  Active  Participation Quality  Appropriate   Other Participation Quality:   Affect  Appropriate   Cognitive  Appropriate   Reaction to Medications/Concerns:   Patient reports that pain medication is still not eliminating back pain   Modes of Intervention  Support  Education   Summary of Progress/Plan at Discharge   CSW spoke with psych MD who requests that patient follow up on outpatient basis for counseling.    CSW met with patient at bedside. No visitors present. Sitter present. Patient remembered CSW from previous visit. Patient much more relaxed during this assessment and agreeable to session. Patient reports that although his back is still hurting he is not feeling as depressed today. Patient denies any SI or HI and reports that he feels there is some hope to start feeling better. Patient reports that he wants to dc home with wife when medically stable. Patient reports he has spoken to wife about returning home and she is agreeable to return as well. CSW inquired if patient and wife had discussed safety concerns and brainstorming ideas to contain any dangerous items to be stored away. Patient reports that wife will have control over medications and will observe any medication that he takes. Patient reports that he and wife have been discussing his suicide attempt and patient regrets his actions. Patient agreeable to outpatient counseling.    CSW called patient's current provider at [facility] who reported they will not have any appointments available until the second week of January. CSW called [facility] who  has available appointments. CSW spoke with [facility] while in the room with patient and patient provided his own information and scheduled an appointment. CSW confirmed appointment with representative [facility]) and placed appointment on AVS.

## 2012-11-19 NOTE — Progress Notes (Signed)
Brief Nutrition Note:  RD pulled to pt for MST (Malnutrition Screening Tool) score of 3. Pt indicated unintentional weight loss and poor appetite PTA.   Pt states that he was not eating well r/t pain and lost about 15 lbs over the past month. Though pt states his usual weight is about 200 lbs, had gain some and then lost some. Weight change not significant for pt. Reports increased appetite at this admission. Appetite likely also effected by emotional state. Pt planned to f/u with psych as out patient.   Notes indicate pt to d/c today. RD will not follow, no nutrition interventions warranted at this time. Please consult if needed.    Clarene Duke RD, LDN Pager [phone] After Hours pager [phone]

## 2012-11-19 NOTE — Discharge Summary (Addendum)
Physician Discharge Summary  Jonathan Perez AVW:[id number] DOB: Jul 03, 1976 DOA: 11/16/2012  PCP: Sheila Oats, MD  Admit date: 11/16/2012 Discharge date: 11/19/2012  Time spent: >30 minutes  Recommendations for Outpatient Follow-up:      Follow-up Information    Follow up with [facility]. On 11/26/2012. (Appointment at 1:30pm with Mrs. Sarvis)    Contact information:   [address] [place], [place] [address] [phone]      Please follow up. (PCP in 1-2weeks,call for appt)    Contact information:   [address] [place] [address] [phone]       Please follow up. (Pain clinic as scheduled on 12/23)          Discharge Diagnoses:  Principal Problem:  *Drug overdose, intentional Active Problems:  Hypokalemia  Bradycardia  Chronic pain  Leukocytosis   Discharge Condition: Improved/stable  Diet recommendation: Regular diet  Filed Weights   11/17/12 0100 11/17/12 0500 11/18/12 0516  Weight: 101.6 kg (223 lb 15.8 oz) 101.6 kg (223 lb 15.8 oz) 96.571 kg (212 lb 14.4 oz)    History of present illness:  36 year old male with PMH relevant for chronic back pain post multiple back surgeries and instrumentation. He uses narcotics chronically. He also has history of kidney stones and ADD. Today at 19:50 the patient ingested 119 tablets of Zanaflex (Tizanidine). He became somnolent and EMS was called by his wife. He was found bradycardic and transcutaneous pacing was started. At arrival to the ED lethargic but arousable. Bradycardic (sinus) in the 40's and hypertensive. Poison control called and supportive management was recommended. His ental status deteriorated and was unable to protect his airway. He was intubated in the ED. Hypertensive with systolic in the 200's. NTG drip started.    Hospital Course:  Acute resp failure due to OD: -As discussed above, Patient was intubated admitted to [facility] service and followed and managed his vent and he  subsequently self extubated on 12/9. The followed and he was protecting his airway and oxygenating well on room air he was subsequently transferred to the hospitalist service on 12/10. History made of stable his O2 sats 96-99% on room air and is medically stable for discharge for outpatient followup at this time. 2-Chronic pain: patient with hx of back pain (chronic); out of meds prior to incident of OD. Per patient reports is worse than ever.  - Lumbar spine CT was done and read as worrisome abnormality on the present examination is the appearance of the L5-S1 disc space followed by the L4-5 disc space where anterior fusion was attempted but appears unsuccessful particularly at the L5-S1 level where there is no fusion anteriorly or posteriorly.  Loosening of hardware at these levels as detailed above.  Bilateral L5-S1 foraminal narrowing greater on the right. Difficult to assess for degree of spinal stenosis at the L4-5 and L5-S1 level secondary to metallic artifact  Infection at the L5-S1 level would be difficult to exclude. CT of thoracic spine was also done and the results as below. I called Dr. Sharolyn Douglas who did an patient's surgeries in the past for a consult he reviewed  these scans and as far as of loosening of the hardware especially at L5-S1 as detailed above he states that this has been present for years and that the anterior surgery was aimed correcting this, and that he has nothing more to offer him surgically and recommend that he follows up at the pain clinic for continued pain management,. She  states that patient does not need to follow up with him outpatient. Also states that there is no evidence of infection based on the films and that the changes that from the surgeries that he has had. I discussed these recommendations with patient and his wife, he states he'll follow up outpatient at the pain clinic. The Zanaflex has been discontinued and this was discussed with patient as  well. 3-Depression/OD: per psych.  Psychiatry was consulted and Dr. Ferol [name] saw patient, he followed and increased his Cymbalta to 60 mg daily. She was cleared by psych -and Dr psych signed off on 12/10. Social worker followed up and he was set up to follow up outpatient at [facility]. 4-Bradycardia: sinus and most likely secondary to OD with muscle relaxant. He has remained asymptomatic - He is chest pain-free with no dizziness or shortness of breath. the Zanaflex was discontinued and he is aware of this.  5-Hypokalemia: -Resolved, his potassium was replaced. Magnesium level is normal at 1.8.  6.HTN- pt initially required NTG drip and his lelevated blood pressures resolved with that and the NTG was dc'ed and he has not required any further meds for BP control. He does not have any prior h/o HTN, he is to follow up with his PCP for further monitoring of his BPs and management as clinically appropriate.    Procedures: ETT 12/8>>>12/9 (self extubated)    Consultations:  CCM  Psychiatry  Discharge Exam: Filed Vitals:   11/18/12 1127 11/18/12 1545 11/18/12 2058 11/19/12 0600  BP: 157/90 144/96 139/88 124/82  Pulse: 44 53 50 54  Temp: 98.2 F (36.8 C) 97.9 F (36.6 C) 98.3 F (36.8 C) 98.2 F (36.8 C)  TempSrc: Oral Oral Oral Oral  Resp: 18 18 20 18   Height:      Weight:      SpO2: 99% 98% 98% 96%   Exam:  General: No fever no CP, breathing comfortable; complaining of severe back pain  Cardiovascular: sinus bradycardia, no rubs or gallops  Respiratory: CTA bilaterally  Abdomen: soft, NT, ND, positive BS  Extremities: no edema, no cyanosis  Neuro: AAOX3, non focal.   Discharge Instructions  Discharge Orders    Future Orders Please Complete By Expires   Diet Carb Modified      Increase activity slowly          Medication List     As of 11/19/2012 12:52 PM    STOP taking these medications         tiZANidine 4 MG tablet   Commonly known as: ZANAFLEX       TAKE these medications         amphetamine-dextroamphetamine 30 MG tablet   Commonly known as: ADDERALL   Take 30 mg by mouth 2 (two) times daily.      clonazePAM 2 MG tablet   Commonly known as: KLONOPIN   Take 2 mg by mouth 2 (two) times daily.      DULoxetine 60 MG capsule   Commonly known as: CYMBALTA   Take 1 capsule (60 mg total) by mouth daily.      oxycodone 30 MG immediate release tablet   Commonly known as: ROXICODONE   Take 1 tablet (30 mg total) by mouth every 4 (four) hours.      oxymorphone 40 MG 12 hr tablet   Commonly known as: OPANA ER   Take 1 tablet (40 mg total) by mouth every 8 (eight) hours.      SUMAtriptan 6 MG/0.5ML  Soln injection   Commonly known as: IMITREX   Inject 6 mg into the skin every 2 (two) hours as needed. for migraines.           Follow-up Information    Follow up with [facility]. On 11/26/2012. (Appointment at 1:30pm with Mrs. Sarvis)    Contact information:   [address] [place], [place] [address] [phone]      Please follow up. (PCP in 1-2weeks,call for appt)    Contact information:   [address] [place] [address] [phone]       Please follow up. (Pain clinic as scheduled on 12/23)           The results of significant diagnostics from this hospitalization (including imaging, microbiology, ancillary and laboratory) are listed below for reference.    Significant Diagnostic Studies: Ct Head Wo Contrast  11/16/2012  *RADIOLOGY REPORT*  Clinical Data: Overdose.Hypertensive emergency.  CT HEAD WITHOUT CONTRAST  Technique:  Contiguous axial images were obtained from the base of the skull through the vertex without contrast.  Comparison: MRI brain 07/11/2012.  Findings: No mass lesion, mass effect, midline shift, hydrocephalus, hemorrhage.  No territorial ischemia or acute infarction.  Paranasal sinuses appear within normal limits.  Study was discussed with Dr. Ethelda Chick at the time of dictation.   IMPRESSION: Negative CT head.   Original Report Authenticated By: Andreas Newport, M.D.    Ct Thoracic Spine Wo Contrast  11/18/2012  *RADIOLOGY REPORT*  Clinical Data:  Chronic back pain.  CT THORACIC AND LUMBAR SPINE WITHOUT CONTRAST  Technique:  Multidetector CT imaging of the thoracic and lumbar spine was performed without contrast. Multiplanar CT image reconstructions were also generated.  Comparison:  CT of the abdomen and pelvis 04/21/2010.  CT of the chest 03/03/2011.  CT of the lumbar spine and thoracic spine 08/23/2008.  CT THORACIC SPINE  Findings:  Prior T12 corpectomy with fusion T11-L1 (noted on the remote 08/23/2008 exam). More recent fusion with rod placement from T3 to the sacrum.  The left T10 pedicle screw extends slightly into the superior aspect of the neural foramen.  Left T3, right T4, left T7 and left T8 hook extend partially into the canal.  Evaluation limited by a metallic artifact.  No significant thoracic canal bony stenosis detected.  Left lung base subsegmental atelectasis/scarring.  Nonobstructing left renal calculi.  IMPRESSION: Prior T12 corpectomy with fusion T11-L1 (noted on the remote 08/23/2008 exam). More recent fusion with rod placement from T3 to the sacrum as noted above.  Evaluation limited by a metallic artifact.  No significant thoracic canal bony stenosis detected.  CT LUMBAR SPINE  Findings: Nonobstructing left renal calculi.  Prior T12 corpectomy with fusion T11-L1 from the left lateral approach noted 08/2008 exam.  Placement of bilateral L1, L2 and L3 pedicle screws.  L4 pedicle screws have been placed and removed.  L5 pedicle screws and S1 pedicle screws placed bilaterally with evidence of loosening of the S1 pedicle screws bilaterally and the left L5 pedicle screw.  Placement of the left iliac weighting screw.  Attempted anterior fusion of L4-L5 with loosening of the screws utilized for this fusion and incomplete bony fusion across the disc space with marked  sclerosis and cystic changes surrounding the L5- S1 disc space which is markedly abnormal.  Posterior element fusion noted from the corpectomy site to the L5 level.  Incomplete posterior fusion L5-S1.  Harvesting graft site right iliac wing.  Mild dextroscoliosis lumbar spine.  Given  the sclerosis and abnormality at the L4-5 and L5-S1 level and poor definition of fat planes between the adjacent psoas muscle, infection could not be excluded if this were high clinical concern.  Artifact slightly limits evaluation however no significant spinal stenosis or foraminal narrowing L1-2 through L3-4.  L4-5:  Evaluation limited by artifact.  There may be mild spinal stenosis.  L5-S1:  Evaluation limited by artifact.  Bilateral foraminal narrowing greater on the right.  Limited for assessing the possibility spinal stenosis.  IMPRESSION: Prior extensive fusion.  Please see above for detail.  Most worrisome abnormality on the present examination is the appearance of the L5-S1 disc space followed by the L4-5 disc space where anterior fusion was attempted but appears unsuccessful particularly at the L5-S1 level where there is no fusion anteriorly or posteriorly.  Loosening of hardware at these levels as detailed above.  Bilateral L5-S1 foraminal narrowing greater on the right. Difficult to assess for degree of spinal stenosis at the L4-5 and L5-S1 level secondary to metallic artifact  Infection at the L5-S1 level would be difficult to exclude in the proper clinical setting although this appearance may represent significant reactive changes to the prior surgical procedure and incomplete fusion ( abnormal mechanics at the L5-S1 level).   Original Report Authenticated By: Lacy Duverney, M.D.    Ct Lumbar Spine Wo Contrast  11/18/2012  *RADIOLOGY REPORT*  Clinical Data:  Chronic back pain.  CT THORACIC AND LUMBAR SPINE WITHOUT CONTRAST  Technique:  Multidetector CT imaging of the thoracic and lumbar spine was performed without  contrast. Multiplanar CT image reconstructions were also generated.  Comparison:  CT of the abdomen and pelvis 04/21/2010.  CT of the chest 03/03/2011.  CT of the lumbar spine and thoracic spine 08/23/2008.  CT THORACIC SPINE  Findings:  Prior T12 corpectomy with fusion T11-L1 (noted on the remote 08/23/2008 exam). More recent fusion with rod placement from T3 to the sacrum.  The left T10 pedicle screw extends slightly into the superior aspect of the neural foramen.  Left T3, right T4, left T7 and left T8 hook extend partially into the canal.  Evaluation limited by a metallic artifact.  No significant thoracic canal bony stenosis detected.  Left lung base subsegmental atelectasis/scarring.  Nonobstructing left renal calculi.  IMPRESSION: Prior T12 corpectomy with fusion T11-L1 (noted on the remote 08/23/2008 exam). More recent fusion with rod placement from T3 to the sacrum as noted above.  Evaluation limited by a metallic artifact.  No significant thoracic canal bony stenosis detected.  CT LUMBAR SPINE  Findings: Nonobstructing left renal calculi.  Prior T12 corpectomy with fusion T11-L1 from the left lateral approach noted 08/2008 exam.  Placement of bilateral L1, L2 and L3 pedicle screws.  L4 pedicle screws have been placed and removed.  L5 pedicle screws and S1 pedicle screws placed bilaterally with evidence of loosening of the S1 pedicle screws bilaterally and the left L5 pedicle screw.  Placement of the left iliac weighting screw.  Attempted anterior fusion of L4-L5 with loosening of the screws utilized for this fusion and incomplete bony fusion across the disc space with marked sclerosis and cystic changes surrounding the L5- S1 disc space which is markedly abnormal.  Posterior element fusion noted from the corpectomy site to the L5 level.  Incomplete posterior fusion L5-S1.  Harvesting graft site right iliac wing.  Mild dextroscoliosis lumbar spine.  Given the sclerosis and abnormality at the L4-5 and L5-S1  level and poor definition of fat planes between  the adjacent psoas muscle, infection could not be excluded if this were high clinical concern.  Artifact slightly limits evaluation however no significant spinal stenosis or foraminal narrowing L1-2 through L3-4.  L4-5:  Evaluation limited by artifact.  There may be mild spinal stenosis.  L5-S1:  Evaluation limited by artifact.  Bilateral foraminal narrowing greater on the right.  Limited for assessing the possibility spinal stenosis.  IMPRESSION: Prior extensive fusion.  Please see above for detail.  Most worrisome abnormality on the present examination is the appearance of the L5-S1 disc space followed by the L4-5 disc space where anterior fusion was attempted but appears unsuccessful particularly at the L5-S1 level where there is no fusion anteriorly or posteriorly.  Loosening of hardware at these levels as detailed above.  Bilateral L5-S1 foraminal narrowing greater on the right. Difficult to assess for degree of spinal stenosis at the L4-5 and L5-S1 level secondary to metallic artifact  Infection at the L5-S1 level would be difficult to exclude in the proper clinical setting although this appearance may represent significant reactive changes to the prior surgical procedure and incomplete fusion ( abnormal mechanics at the L5-S1 level).   Original Report Authenticated By: Lacy Duverney, M.D.    Dg Chest Portable 1 View  11/16/2012  *RADIOLOGY REPORT*  Clinical Data: Drug overdose.  PORTABLE CHEST - 1 VIEW  Comparison: 11/16/2012, 2113 hours.  Findings: Endotracheal tube tip is 35 mm from the carina, in good position.  Harrington rods are again noted.  Defibrillator pads are present over the chest.  Mild left basilar atelectasis.  No airspace disease.  No effusion. Monitoring leads are projected over the chest.  The cardiopericardial silhouette and mediastinal contours appear within normal limits.  IMPRESSION: Interval intubation with the endotracheal tube tip 35  mm from the carina.  No other interval change.   Original Report Authenticated By: Andreas Newport, M.D.    Dg Chest Portable 1 View  11/16/2012  *RADIOLOGY REPORT*  Clinical Data: Shortness of breath.  Decreased heart rate.  PORTABLE CHEST - 1 VIEW  Comparison: 03/07/2011  Findings: Shallow inspiration.  Normal heart size and pulmonary vascularity.  No focal airspace consolidation in the lungs.  No blunting of costophrenic angles.  No pneumothorax.  Mediastinal contours appear intact.  Postoperative changes in the thoracolumbar spine.  No significant change since previous study.  IMPRESSION: No evidence of active pulmonary disease.   Original Report Authenticated By: Burman Nieves, M.D.     Microbiology: Recent Results (from the past 240 hour(s))  URINE CULTURE     Status: Normal   Collection Time   11/16/12  9:45 PM      Component Value Range Status Comment   Specimen Description URINE, CATHETERIZED   Final    Special Requests CX ADDED AT 0058 ON [id number]   Final    Culture  Setup Time 11/17/2012 09:26   Final    Colony Count NO GROWTH   Final    Culture NO GROWTH   Final    Report Status 11/18/2012 FINAL   Final   MRSA PCR SCREENING     Status: Normal   Collection Time   11/17/12  1:25 AM      Component Value Range Status Comment   MRSA by PCR NEGATIVE  NEGATIVE Final   URINE CULTURE     Status: Normal   Collection Time   11/17/12  1:36 AM      Component Value Range Status Comment   Specimen Description URINE, CATHETERIZED  Final    Special Requests Normal   Final    Culture  Setup Time 11/17/2012 02:34   Final    Colony Count NO GROWTH   Final    Culture NO GROWTH   Final    Report Status 11/18/2012 FINAL   Final      Labs: Basic Metabolic Panel:  Lab 11/19/12 8119 11/18/12 0530 11/17/12 0429 11/17/12 0048 11/16/12 2325 11/16/12 2157  NA 139 138 135 133* -- 131*  K 3.5 3.1* 4.0 4.6 -- 5.3*  CL 103 103 104 102 -- 96  CO2 26 25 19 20  -- 22  GLUCOSE 91 87 116* 194* -- 323*   BUN 9 10 14 14  -- 13  CREATININE 1.14 1.00 0.95 1.13 1.04 --  CALCIUM 8.9 8.9 8.8 8.7 -- 9.1  MG 1.8 1.7 -- -- -- --  PHOS -- 2.5 -- -- -- --   Liver Function Tests:  Lab 11/16/12 2157  AST 33  ALT 28  ALKPHOS 102  BILITOT 1.0  PROT 7.1  ALBUMIN 3.6   No results found for this basename: LIPASE:5,AMYLASE:5 in the last 168 hours No results found for this basename: AMMONIA:5 in the last 168 hours CBC:  Lab 11/19/12 0410 11/18/12 0530 11/17/12 0429 11/16/12 2325 11/16/12 2157  WBC 9.2 11.4* 13.4* 16.0* 18.0*  NEUTROABS -- -- -- -- 14.8*  HGB 14.0 14.1 15.3 15.3 17.1*  HCT 39.9 41.1 44.1 43.4 48.2  MCV 90.5 90.9 91.5 91.4 92.0  PLT 236 229 303 340 384   Cardiac Enzymes: No results found for this basename: CKTOTAL:5,CKMB:5,CKMBINDEX:5,TROPONINI:5 in the last 168 hours BNP: BNP (last 3 results) No results found for this basename: PROBNP:3 in the last 8760 hours CBG:  Lab 11/17/12 0138  GLUCAP 160*       Signed:  Mylisa Brunson C  [facility] 11/19/2012, 12:52 PM

## 2012-11-20 NOTE — Care Management Note (Signed)
    Page 1 of 1   11/20/2012     8:48:17 AM   CARE MANAGEMENT NOTE 11/20/2012  Patient:  MICAH, BARNIER   Account Number:  [id number]  Date Initiated:  11/17/2012  Documentation initiated by:  Avie Arenas  Subjective/Objective Assessment:   OD - ?? intentional or unintentional.  Has wife - independent prior.     Action/Plan:   Anticipated DC Date:  11/20/2012   Anticipated DC Plan:  HOME/SELF CARE  In-house referral  Clinical Social Worker      DC Planning Services  CM consult      Choice offered to / List presented to:             Status of service:  Completed, signed off Medicare Important Message given?   (If response is "NO", the following Medicare IM given date fields will be blank) Date Medicare IM given:   Date Additional Medicare IM given:    Discharge Disposition:  HOME/SELF CARE  Per UR Regulation:  Reviewed for med. necessity/level of care/duration of stay  If discussed at Long Length of Stay Meetings, dates discussed:    Comments:  ContactMekhi, Sonn [phone]  11/20/12 8:47 Letha Cape RN, BSN [phone] patient was cleared by psych so he will follow up as outpt. Pt dc'd home  CSW psych following patient.  11-17-12 3:45pm Avie Arenas, RNBSN [phone] Self extubated - very sleepy - referral made to SW.

## 2013-01-19 ENCOUNTER — Emergency Department (HOSPITAL_COMMUNITY): Payer: Medicare Other

## 2013-01-19 ENCOUNTER — Encounter (HOSPITAL_COMMUNITY): Payer: Self-pay | Admitting: Nurse Practitioner

## 2013-01-19 ENCOUNTER — Emergency Department (HOSPITAL_COMMUNITY)
Admission: EM | Admit: 2013-01-19 | Discharge: 2013-01-19 | Disposition: A | Discharge status: Home / Self Care | Payer: Medicare Other | Attending: Emergency Medicine | Admitting: Emergency Medicine

## 2013-01-19 DIAGNOSIS — R296 Repeated falls: Secondary | ICD-10-CM | POA: Insufficient documentation

## 2013-01-19 DIAGNOSIS — Z79899 Other long term (current) drug therapy: Secondary | ICD-10-CM | POA: Insufficient documentation

## 2013-01-19 DIAGNOSIS — T148XXA Other injury of unspecified body region, initial encounter: Secondary | ICD-10-CM

## 2013-01-19 DIAGNOSIS — Y929 Unspecified place or not applicable: Secondary | ICD-10-CM | POA: Insufficient documentation

## 2013-01-19 DIAGNOSIS — F17200 Nicotine dependence, unspecified, uncomplicated: Secondary | ICD-10-CM | POA: Insufficient documentation

## 2013-01-19 DIAGNOSIS — Y9389 Activity, other specified: Secondary | ICD-10-CM | POA: Insufficient documentation

## 2013-01-19 DIAGNOSIS — S7000XA Contusion of unspecified hip, initial encounter: Secondary | ICD-10-CM | POA: Insufficient documentation

## 2013-01-19 DIAGNOSIS — Z9889 Other specified postprocedural states: Secondary | ICD-10-CM | POA: Insufficient documentation

## 2013-01-19 DIAGNOSIS — F988 Other specified behavioral and emotional disorders with onset usually occurring in childhood and adolescence: Secondary | ICD-10-CM | POA: Insufficient documentation

## 2013-01-19 DIAGNOSIS — Z87442 Personal history of urinary calculi: Secondary | ICD-10-CM | POA: Insufficient documentation

## 2013-01-19 MED ORDER — HYDROCODONE-ACETAMINOPHEN 5-325 MG PO TABS
2.0000 | ORAL_TABLET | Freq: Once | ORAL | Status: AC
  Administered 2013-01-19: 2 via ORAL
  Filled 2013-01-19: qty 2

## 2013-01-19 MED ORDER — IBUPROFEN 400 MG PO TABS
600.0000 mg | ORAL_TABLET | Freq: Once | ORAL | Status: AC
  Administered 2013-01-19: 600 mg via ORAL
  Filled 2013-01-19: qty 1

## 2013-01-19 MED ORDER — IBUPROFEN 600 MG PO TABS: 600.0000 mg | ORAL_TABLET | Freq: Four times a day (QID) | ORAL | Status: DC | PRN

## 2013-01-19 NOTE — ED Notes (Signed)
Pt c/o L hip pain since falling on ice Thursday. History of surgery on this hip so he is concerned because he has been having severe pain since the fall. Ambulatory with pain, cms intact

## 2013-01-19 NOTE — ED Notes (Addendum)
Patient states apprx 5 days ago he suffered a fall on the ice where he landed on concrete. Since that time he has been having left pelvic pain and it runs down his leg and has been having numbing and tingling in that leg. He is also concerned because he has a bolt in his left pelvis.

## 2013-01-19 NOTE — ED Notes (Signed)
Patient transported to CT 

## 2013-01-19 NOTE — ED Provider Notes (Signed)
History     This chart was scribed for Jonathan Kaplan, MD, MD by Jonathan Perez, ED Scribe. The patient was seen in room TR08C/TR08C and the patient's care was started at 6:41 PM.   CSN: [id number]  Arrival date & time 01/19/13  1624   None     Chief Complaint  Patient presents with  . Hip Pain     The history is provided by the patient. No language interpreter was used.   Jonathan Perez is a 37 y.o. male who presents to the Emergency Department complaining of constant, severe hip pain onset 5 days ago due to falling after slipping on ice outside of his house. There was sudden onset. Pt reports that he landed on his left side during fall. He states that he has hx of back surgery and bolt in left hip. He reports that he normally takes oxycodone and opana but it is not providing relief. He states that movement aggravates the pain. He is able to walk but has pain. Pt denies fever, chills, nausea, vomiting, diarrhea, weakness in extremities, numbness in lower extremities, cough, SOB and any other pain.    Neurologist is at [facility]   Past Medical History  Diagnosis Date  . Chronic back pain   . Kidney stones   . Kidney stones   . ADD (attention deficit disorder)     Past Surgical History  Procedure Laterality Date  . Back surgery    . Hand surgery      Family History  Problem Relation Age of Onset  . Diabetes Other   . Stroke Other     History  Substance Use Topics  . Smoking status: Current Every Day Smoker -- 0.50 packs/day    Types: Cigarettes  . Smokeless tobacco: Not on file  . Alcohol Use: No      Review of Systems  Constitutional: Negative for fever and chills.  Respiratory: Negative for cough and shortness of breath.   Gastrointestinal: Negative for nausea, vomiting and diarrhea.  Musculoskeletal: Positive for arthralgias.  Neurological: Negative for weakness.  All other systems reviewed and are negative.    Allergies  Reglan  Home Medications    Current Outpatient Rx  Name  Route  Sig  Dispense  Refill  . amphetamine-dextroamphetamine (ADDERALL) 30 MG tablet   Oral   Take 30 mg by mouth 2 (two) times daily.         . clonazePAM (KLONOPIN) 2 MG tablet   Oral   Take 2 mg by mouth 2 (two) times daily.         . DULoxetine (CYMBALTA) 60 MG capsule   Oral   Take 1 capsule (60 mg total) by mouth daily.   30 capsule   0   . oxycodone (ROXICODONE) 30 MG immediate release tablet   Oral   Take 1 tablet (30 mg total) by mouth every 4 (four) hours.   30 tablet   0   . oxymorphone (OPANA ER) 40 MG 12 hr tablet   Oral   Take 1 tablet (40 mg total) by mouth every 8 (eight) hours.   60 tablet   0   . SUMAtriptan (IMITREX) 6 MG/0.5ML SOLN injection   Subcutaneous   Inject 6 mg into the skin every 2 (two) hours as needed. for migraines.           BP 151/109  Pulse 99  Temp(Src) 98.2 F (36.8 C) (Oral)  Resp 16  SpO2 96%  Physical  Exam  Nursing note and vitals reviewed. Constitutional: He is oriented to person, place, and time. He appears well-developed and well-nourished. No distress.  HENT:  Head: Normocephalic and atraumatic.  Eyes: EOM are normal.  Neck: Neck supple. No tracheal deviation present.  Cardiovascular: Normal rate, regular rhythm and normal heart sounds.   No murmur heard. Pulmonary/Chest: Effort normal and breath sounds normal. No respiratory distress.  Musculoskeletal: Normal range of motion. He exhibits no edema.  Tenderness over sacral iliac region  Able to ambulate    Neurological: He is alert and oriented to person, place, and time.  Positive straight leg in left lower extremity    Skin: Skin is warm and dry.  Psychiatric: He has a normal mood and affect. His behavior is normal.    ED Course  Procedures (including critical care time) DIAGNOSTIC STUDIES: Oxygen Saturation is 96% on room air, normal by my interpretation.    COORDINATION OF CARE: 6:44 PM Discussed ED treatment  with pt and pt agrees.     Labs Reviewed - No data to display No results found.   No diagnosis found.    MDM  I personally performed the services described in this documentation, which was scribed in my presence. The recorded information has been reviewed and is accurate.  Pt comes in with cc of hip pain. Pt has hx of surgery to the lumbar spine and the hips. He had a fall recently, and the pain has persisted - has tenderness with ambulation.  Will get CT to ensure there is no occult fracture.    Jonathan Kaplan, MD 01/19/13 [phone]

## 2013-02-26 ENCOUNTER — Emergency Department (HOSPITAL_COMMUNITY): Payer: Medicare Other

## 2013-02-26 ENCOUNTER — Encounter (HOSPITAL_COMMUNITY): Payer: Self-pay

## 2013-02-26 ENCOUNTER — Emergency Department (HOSPITAL_COMMUNITY)
Admission: EM | Admit: 2013-02-26 | Discharge: 2013-02-27 | Disposition: A | Discharge status: Home / Self Care | Payer: Medicare Other | Attending: Emergency Medicine | Admitting: Emergency Medicine

## 2013-02-26 DIAGNOSIS — R4689 Other symptoms and signs involving appearance and behavior: Secondary | ICD-10-CM

## 2013-02-26 DIAGNOSIS — Z87442 Personal history of urinary calculi: Secondary | ICD-10-CM | POA: Insufficient documentation

## 2013-02-26 DIAGNOSIS — G8911 Acute pain due to trauma: Secondary | ICD-10-CM | POA: Insufficient documentation

## 2013-02-26 DIAGNOSIS — F988 Other specified behavioral and emotional disorders with onset usually occurring in childhood and adolescence: Secondary | ICD-10-CM | POA: Insufficient documentation

## 2013-02-26 DIAGNOSIS — Z79899 Other long term (current) drug therapy: Secondary | ICD-10-CM | POA: Insufficient documentation

## 2013-02-26 DIAGNOSIS — G8929 Other chronic pain: Secondary | ICD-10-CM | POA: Insufficient documentation

## 2013-02-26 DIAGNOSIS — F911 Conduct disorder, childhood-onset type: Secondary | ICD-10-CM | POA: Insufficient documentation

## 2013-02-26 DIAGNOSIS — F17200 Nicotine dependence, unspecified, uncomplicated: Secondary | ICD-10-CM | POA: Insufficient documentation

## 2013-02-26 DIAGNOSIS — F1910 Other psychoactive substance abuse, uncomplicated: Secondary | ICD-10-CM | POA: Insufficient documentation

## 2013-02-26 DIAGNOSIS — M25559 Pain in unspecified hip: Secondary | ICD-10-CM | POA: Insufficient documentation

## 2013-02-26 LAB — COMPREHENSIVE METABOLIC PANEL
ALT: 11 U/L (ref 0–53)
Alkaline Phosphatase: 96 U/L (ref 39–117)
BUN: 18 mg/dL (ref 6–23)
CO2: 26 mEq/L (ref 19–32)
Chloride: 105 mEq/L (ref 96–112)
GFR calc Af Amer: >90 mL/min (ref >90)
Glucose, Bld: 104 mg/dL — ABNORMAL HIGH (ref 70–99)
Potassium: 4.4 mEq/L (ref 3.5–5.1)
Sodium: 141 mEq/L (ref 135–145)
Total Bilirubin: 0.3 mg/dL (ref 0.3–1.2)
Total Protein: 7.8 g/dL (ref 6.0–8.3)

## 2013-02-26 LAB — CBC
HCT: 47.9 % (ref 39.0–52.0)
Hemoglobin: 16.9 g/dL (ref 13.0–17.0)
MCHC: 35.3 g/dL (ref 30.0–36.0)
RBC: 5.23 MIL/uL (ref 4.22–5.81)
WBC: 11.7 10*3/uL — ABNORMAL HIGH (ref 4.0–10.5)

## 2013-02-26 LAB — ACETAMINOPHEN LEVEL: Acetaminophen (Tylenol), Serum: <15 ug/mL (ref 10–30)

## 2013-02-26 LAB — ETHANOL: Alcohol, Ethyl (B): <11 mg/dL (ref 0–11)

## 2013-02-26 MED ORDER — ONDANSETRON HCL 4 MG PO TABS: 4.0000 mg | ORAL_TABLET | Freq: Three times a day (TID) | ORAL | Status: DC | PRN

## 2013-02-26 MED ORDER — ACETAMINOPHEN 325 MG PO TABS: 650.0000 mg | ORAL_TABLET | ORAL | Status: DC | PRN

## 2013-02-26 MED ORDER — ZIPRASIDONE MESYLATE 20 MG IM SOLR
INTRAMUSCULAR | Status: AC
  Administered 2013-02-26: 20 mg
  Filled 2013-02-26: qty 20

## 2013-02-26 MED ORDER — ALUM & MAG HYDROXIDE-SIMETH 200-200-20 MG/5ML PO SUSP: 30.0000 mL | ORAL | Status: DC | PRN

## 2013-02-26 MED ORDER — ZOLPIDEM TARTRATE 5 MG PO TABS: 5.0000 mg | ORAL_TABLET | Freq: Every evening | ORAL | Status: DC | PRN

## 2013-02-26 MED ORDER — ZIPRASIDONE MESYLATE 20 MG IM SOLR: 20.0000 mg | Freq: Once | INTRAMUSCULAR | Status: AC

## 2013-02-26 MED ORDER — NICOTINE 21 MG/24HR TD PT24: 21.0000 mg | MEDICATED_PATCH | Freq: Every day | TRANSDERMAL | Status: DC

## 2013-02-26 MED ORDER — LORAZEPAM 1 MG PO TABS
1.0000 mg | ORAL_TABLET | Freq: Three times a day (TID) | ORAL | Status: DC | PRN
  Filled 2013-02-26: qty 1

## 2013-02-26 MED ORDER — IBUPROFEN 600 MG PO TABS: 600.0000 mg | ORAL_TABLET | Freq: Three times a day (TID) | ORAL | Status: DC | PRN

## 2013-02-26 NOTE — ED Notes (Signed)
Pt brought in by the [facility] from [place], pt is IVC, pt uncooperative on arrival, pt cursing and combative towards staff. Pt c/o lt hip pain. Pt was petitioned by his stepfather, states pt stated SI/SA, abusing rx meds

## 2013-02-26 NOTE — ED Notes (Signed)
Patient restraints remain on; no s/s of distress noted. Respirations regular and unlabored. Pt continues to curse staff.
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
## 2013-02-26 NOTE — ED Notes (Signed)
This RN offered patient drink, food and the urinal. Pt cursing and refusing care at this time. Circulation WNL in all extremities.

## 2013-02-26 NOTE — ED Notes (Signed)
Patient currently in telepsych session with Dr. Lydia Guiles. Patient cursing doctor and yelling "Fuck you!" Pt groggy and fallling asleep while speaking multiple times.

## 2013-02-26 NOTE — ED Notes (Signed)
Pt's stepfather to window with reports that pt has chronic pain and takes narcotics in one week that are prescribed for 2 weeks. Pt's stepfather reports that once pt is out of narcotics he becomes violent with family members as well as threatens to kill himself. Per stepfather, pt does not take mental health meds as prescribed and has been through this process before but is "a smooth talker and is discharged after just a couple days". Contact information left and stepfather requesting a phone call with update and if pt is admitted or discharged [phone] & Mom [phone]).

## 2013-02-26 NOTE — ED Notes (Signed)
Patient with restraints on; no s/s of distress noted. Respirations regular and unlabored.

## 2013-02-26 NOTE — ED Notes (Signed)
Patient becoming increasingly aggressive and unwilling to follow directions. Patient attempting to place his fingers into electric outlets. Pt screaming, cursing and threatening staff and officers.

## 2013-02-26 NOTE — ED Notes (Signed)
This RN in pt's room to assess his behaviors. Pt cursing and yelling stating "Let me Arlan Organ here!" Pt unable to be redirected from yelling. No distress noted at this time.

## 2013-02-26 NOTE — ED Notes (Signed)
Pt arrives accompanied by police in handcuffs with IVC papers cursing and yelling at times, cooperative with nurse.

## 2013-02-26 NOTE — ED Notes (Signed)
Restraints removed; no s/s of distress noted. Pt resting with eyes closed.

## 2013-02-26 NOTE — ED Notes (Signed)
Patient with restraints on. No s/s of distress noted. Pt continues to curse and threat staff. Respirations regular and unlabored.

## 2013-02-26 NOTE — ED Provider Notes (Signed)
History     CSN: [id number]  Arrival date & time 02/26/13  1340   First MD Initiated Contact with Patient 02/26/13 1438      Chief Complaint  Patient presents with  . Medical Clearance    IVC    (Consider location/radiation/quality/duration/timing/severity/associated sxs/prior treatment) HPI A LEVEL 5 CAVEAT PERTAINS DUE TO UNCOOPERATIVE PATIENT.  Pt presents after being brought in by police under IVC paperwork from [facility].  IVC paperwork reportedly taken out due to pt being violent and threatening suicide.  He also reportedly takes narcotics and behavior worsens when he is off these meds.  Also c/o left hip pain due to recent fall.  Pt refuses to answer questions and is yelling profanities at myself and staff.    Past Medical History  Diagnosis Date  . Chronic back pain   . Kidney stones   . Kidney stones   . ADD (attention deficit disorder)     Past Surgical History  Procedure Laterality Date  . Back surgery    . Hand surgery    . Hip surgery      Family History  Problem Relation Age of Onset  . Diabetes Other   . Stroke Other     History  Substance Use Topics  . Smoking status: Current Every Day Smoker -- 0.50 packs/day    Types: Cigarettes  . Smokeless tobacco: Not on file  . Alcohol Use: No      Review of Systems UNABLE TO OBTAIN ROS DUE TO LEVEL 5 CAVEAT  Allergies  Reglan  Home Medications   Current Outpatient Rx  Name  Route  Sig  Dispense  Refill  . amphetamine-dextroamphetamine (ADDERALL) 30 MG tablet   Oral   Take 30 mg by mouth 2 (two) times daily.         . clonazePAM (KLONOPIN) 2 MG tablet   Oral   Take 2 mg by mouth at bedtime as needed. For sleep         . ibuprofen (ADVIL,MOTRIN) 600 MG tablet   Oral   Take 1 tablet (600 mg total) by mouth every 6 (six) hours as needed for pain.   30 tablet   0   . oxycodone (ROXICODONE) 30 MG immediate release tablet   Oral   Take 1 tablet (30 mg total) by mouth every 4 (four)  hours.   30 tablet   0   . oxymorphone (OPANA ER) 40 MG 12 hr tablet   Oral   Take 1 tablet (40 mg total) by mouth every 8 (eight) hours.   60 tablet   0   . SUMAtriptan (IMITREX) 6 MG/0.5ML SOLN injection   Subcutaneous   Inject 6 mg into the skin every 2 (two) hours as needed. for migraines.           BP 143/67  Pulse 74  Temp(Src) 98.6 F (37 C) (Oral)  Resp 18  SpO2 100% Vitals reviewed Physical Exam Physical Examination: General appearance - alert, combative, and in no distress Mental status - alert, oriented to person, place, and time Eyes - no scleral icterus, no conjunctival injection Mouth - mucous membranes moist, pharynx normal without lesions Chest - clear to auscultation, no wheezes, rales or rhonchi, symmetric air entry Neurological - alert, oriented, normal speech, strength 5/5 in extremities x 4 Extremities - peripheral pulses normal, no pedal edema, no clubbing or cyanosis Skin - normal coloration and turgor, no rashes Psych- angry, hostile, combative, shouting expletives  ED Course  Procedures (including critical care time)  CRITICAL CARE Performed by: Ethelda Chick   Total critical care time: 35  Critical care time was exclusive of separately billable procedures and treating other patients.  Critical care was necessary to treat or prevent imminent or life-threatening deterioration.  Critical care was time spent personally by me on the following activities: development of treatment plan with patient and/or surrogate as well as nursing, discussions with consultants, evaluation of patient's response to treatment, examination of patient, obtaining history from patient or surrogate, ordering and performing treatments and interventions, ordering and review of laboratory studies, ordering and review of radiographic studies, pulse oximetry and re-evaluation of patient's condition.  Labs Reviewed  CBC - Abnormal; Notable for the following:    WBC 11.7  (*)    All other components within normal limits  COMPREHENSIVE METABOLIC PANEL - Abnormal; Notable for the following:    Glucose, Bld 104 (*)    All other components within normal limits  SALICYLATE LEVEL - Abnormal; Notable for the following:    Salicylate Lvl <2.0 (*)    All other components within normal limits  ACETAMINOPHEN LEVEL  ETHANOL   Dg Hip Complete Left  02/26/2013  *RADIOLOGY REPORT*  Clinical Data: Left hip pain.  LEFT HIP - COMPLETE 2+ VIEW  Comparison: 02/12/2013.  Findings: Extensive of hardware noted in the lower lumbar spine, sacrum and left ilium.  No obvious complicating features.  There is stable mild lucency around the right S1 screw.  The pubic symphysis and SI joints are intact.  The hips are normally located.  No hip fracture or plain film evidence of avascular necrosis.  IMPRESSION: Stable lower lumbar and sacral fusion hardware. No acute bony findings.   Original Report Authenticated By: Rudie Meyer, M.D.      1. Drug abuse   2. Aggressive behavior       MDM  Pt presenting under IVC from [facility] due to report of SI, narcotic abuse.  Pt uncooperative with history.  Psych holding orders written, labs obtained after sedation with IM geodon.  Pt to be moved to psych ED, will need to be seen by [facility] and psych or telepsychiatry.         Ethelda Chick, MD 02/27/13 [phone]

## 2013-02-26 NOTE — ED Notes (Signed)
Security in to wand pt and pt's personal belongings. Minerva Areola, RN in Psych ED aware that triage unable to remove studs from ears and that pt lethargic after IM Geodon given by Fenton Foy, RN. Pt transported to Psych ED in wheelchair with chart, IVC papers and personal belongings which were placed in locker #42.

## 2013-02-26 NOTE — ED Notes (Signed)
Received from triage. Pt is sedated s/p IM Geodon. Will assess further upon wakening

## 2013-02-26 NOTE — ED Notes (Signed)
Pts IVC paperwork was incorrectly filled out. He is currently Voluntary pending correct IVC paperwork being served.

## 2013-02-26 NOTE — Progress Notes (Signed)
CSW explained to patient process of the ivc and speaking with the psychiatrist. Patient was lying on floor, csw asked patient to sit up to discuss. Patient stated he would not take off his shorts and change back into the scrub pants. Staff assisted patient to the bed and removed patient shorts and shoes. Telepsych pending.   Catha Gosselin, LCSWA  [phone] .02/26/2013 1945pm
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
## 2013-02-27 NOTE — ED Notes (Signed)
Patient discharged. Pt provided a bus pass and discharge instructions. All belongings returned to patient. Patient cursing Emergency planning/management officer and making racial slurs. Pt called RN multiple names. Pt making threats towards staff as he was escorted out by police and security.

## 2013-02-27 NOTE — ED Notes (Signed)
Pt discharged per MD; no s/s of distress noted at this time. Pt awake and alert.

## 2013-03-15 ENCOUNTER — Ambulatory Visit: Payer: Medicare Other

## 2013-03-15 ENCOUNTER — Emergency Department (HOSPITAL_COMMUNITY)
Admission: EM | Admit: 2013-03-15 | Discharge: 2013-03-15 | Disposition: A | Discharge status: Home / Self Care | Payer: Medicare Other | Attending: Emergency Medicine | Admitting: Emergency Medicine

## 2013-03-15 ENCOUNTER — Encounter (HOSPITAL_COMMUNITY): Payer: Self-pay | Admitting: Physical Medicine and Rehabilitation

## 2013-03-15 ENCOUNTER — Emergency Department (HOSPITAL_COMMUNITY): Payer: Medicare Other

## 2013-03-15 DIAGNOSIS — G8929 Other chronic pain: Secondary | ICD-10-CM | POA: Insufficient documentation

## 2013-03-15 DIAGNOSIS — F17200 Nicotine dependence, unspecified, uncomplicated: Secondary | ICD-10-CM | POA: Insufficient documentation

## 2013-03-15 DIAGNOSIS — Z87442 Personal history of urinary calculi: Secondary | ICD-10-CM | POA: Insufficient documentation

## 2013-03-15 DIAGNOSIS — R112 Nausea with vomiting, unspecified: Secondary | ICD-10-CM | POA: Insufficient documentation

## 2013-03-15 DIAGNOSIS — R3915 Urgency of urination: Secondary | ICD-10-CM | POA: Insufficient documentation

## 2013-03-15 DIAGNOSIS — F988 Other specified behavioral and emotional disorders with onset usually occurring in childhood and adolescence: Secondary | ICD-10-CM | POA: Insufficient documentation

## 2013-03-15 DIAGNOSIS — Z79899 Other long term (current) drug therapy: Secondary | ICD-10-CM | POA: Insufficient documentation

## 2013-03-15 DIAGNOSIS — R319 Hematuria, unspecified: Secondary | ICD-10-CM | POA: Insufficient documentation

## 2013-03-15 DIAGNOSIS — N390 Urinary tract infection, site not specified: Secondary | ICD-10-CM

## 2013-03-15 DIAGNOSIS — Z8739 Personal history of other diseases of the musculoskeletal system and connective tissue: Secondary | ICD-10-CM | POA: Insufficient documentation

## 2013-03-15 LAB — URINALYSIS, ROUTINE W REFLEX MICROSCOPIC
Bilirubin Urine: NEGATIVE
Ketones, ur: NEGATIVE mg/dL
Protein, ur: 100 mg/dL — AB
Specific Gravity, Urine: 1.024 (ref 1.005–1.030)
Urobilinogen, UA: 0.2 mg/dL (ref 0.0–1.0)

## 2013-03-15 LAB — URINE MICROSCOPIC-ADD ON

## 2013-03-15 MED ORDER — HYDROMORPHONE HCL PF 1 MG/ML IJ SOLN
1.0000 mg | INTRAMUSCULAR | Status: AC
  Administered 2013-03-15: 1 mg via INTRAVENOUS
  Filled 2013-03-15: qty 1

## 2013-03-15 MED ORDER — CIPROFLOXACIN HCL 500 MG PO TABS: 500.0000 mg | ORAL_TABLET | Freq: Two times a day (BID) | ORAL | Status: DC

## 2013-03-15 MED ORDER — PROMETHAZINE HCL 25 MG PO TABS: 25.0000 mg | ORAL_TABLET | Freq: Four times a day (QID) | ORAL | Status: DC | PRN

## 2013-03-15 MED ORDER — ONDANSETRON HCL 4 MG/2ML IJ SOLN
4.0000 mg | Freq: Once | INTRAMUSCULAR | Status: AC
  Administered 2013-03-15: 4 mg via INTRAVENOUS
  Filled 2013-03-15: qty 2

## 2013-03-15 MED ORDER — CIPROFLOXACIN IN D5W 400 MG/200ML IV SOLN
400.0000 mg | Freq: Once | INTRAVENOUS | Status: AC
  Administered 2013-03-15: 400 mg via INTRAVENOUS
  Filled 2013-03-15: qty 200

## 2013-03-15 NOTE — ED Notes (Signed)
Pt with hx of kidney stones.  Presents today with c/o R flank pain associated with burning urination.

## 2013-03-15 NOTE — ED Notes (Signed)
IV abx must run for 1 hour.  Pt will be d/c'd after antibiotic completed.

## 2013-03-15 NOTE — ED Provider Notes (Signed)
History     CSN: [id number]  Arrival date & time 03/15/13  1316   First MD Initiated Contact with Patient 03/15/13 1358      Chief Complaint  Patient presents with  . Flank Pain    (Consider location/radiation/quality/duration/timing/severity/associated sxs/prior treatment) HPI Comments:  Pt reports right flank pain that began last night.  Associated nausea, vomiting, difficulty starting urine stream, hematuria, urgency.  Pain is in right back, no radiation, is constant.  States this feels worse than prior kidney stones.  Denies fevers, CP, SOB, cough, diarrhea, testicular pain or swelling.   Pt recently here with SI and narcotic abuse.  Currently denies SI.  Does want narcotics for pain.  States he goes to a pain management clinic and is on chronic narcotics.    The history is provided by the patient.    Past Medical History  Diagnosis Date  . Chronic back pain   . Kidney stones   . Kidney stones   . ADD (attention deficit disorder)     Past Surgical History  Procedure Laterality Date  . Back surgery    . Hand surgery    . Hip surgery      Family History  Problem Relation Age of Onset  . Diabetes Other   . Stroke Other     History  Substance Use Topics  . Smoking status: Current Every Day Smoker -- 0.50 packs/day    Types: Cigarettes  . Smokeless tobacco: Not on file  . Alcohol Use: No      Review of Systems  Constitutional: Negative for fever and chills.  Respiratory: Negative for cough and shortness of breath.   Cardiovascular: Negative for chest pain.  Gastrointestinal: Positive for nausea and vomiting. Negative for diarrhea and constipation.  Genitourinary: Positive for urgency, hematuria, flank pain and difficulty urinating. Negative for scrotal swelling and testicular pain.    Allergies  Reglan  Home Medications   Current Outpatient Rx  Name  Route  Sig  Dispense  Refill  . amphetamine-dextroamphetamine (ADDERALL) 30 MG tablet   Oral  Take 30 mg by mouth 2 (two) times daily.         . clonazePAM (KLONOPIN) 2 MG tablet   Oral   Take 2 mg by mouth at bedtime as needed. For sleep         . ibuprofen (ADVIL,MOTRIN) 600 MG tablet   Oral   Take 1 tablet (600 mg total) by mouth every 6 (six) hours as needed for pain.   30 tablet   0   . oxycodone (ROXICODONE) 30 MG immediate release tablet   Oral   Take 1 tablet (30 mg total) by mouth every 4 (four) hours.   30 tablet   0   . oxymorphone (OPANA ER) 40 MG 12 hr tablet   Oral   Take 1 tablet (40 mg total) by mouth every 8 (eight) hours.   60 tablet   0   . SUMAtriptan (IMITREX) 6 MG/0.5ML SOLN injection   Subcutaneous   Inject 6 mg into the skin every 2 (two) hours as needed. for migraines.           BP 215/167  Pulse 107  Temp(Src) 97.5 F (36.4 C) (Oral)  Resp 16  SpO2 97%  Physical Exam  Nursing note and vitals reviewed. Constitutional: He appears well-developed and well-nourished. No distress.  HENT:  Head: Normocephalic and atraumatic.  Neck: Neck supple.  Cardiovascular: Normal rate and regular rhythm.   Pulmonary/Chest:  Effort normal and breath sounds normal. No respiratory distress. He has no wheezes. He has no rales.  Abdominal: Soft. He exhibits no distension and no mass. There is tenderness. There is CVA tenderness. There is no rebound and no guarding.  Mild RLQ tenderness.  Right CVA tenderness.  Neurological: He is alert. He exhibits normal muscle tone.  Skin: He is not diaphoretic.    ED Course  Procedures (including critical care time)  Labs Reviewed  URINALYSIS, ROUTINE W REFLEX MICROSCOPIC - Abnormal; Notable for the following:    Color, Urine AMBER (*)    APPearance TURBID (*)    Hgb urine dipstick LARGE (*)    Protein, ur 100 (*)    Leukocytes, UA MODERATE (*)    All other components within normal limits  URINE MICROSCOPIC-ADD ON - Abnormal; Notable for the following:    Squamous Epithelial / LPF MANY (*)    Bacteria,  UA MANY (*)    All other components within normal limits  URINE CULTURE   Ct Abdomen Pelvis Wo Contrast  03/15/2013  *RADIOLOGY REPORT*  Clinical Data: Right-sided flank pain, hematuria and dysuria.  CT ABDOMEN AND PELVIS WITHOUT CONTRAST  Technique:  Multidetector CT imaging of the abdomen and pelvis was performed following the standard protocol without intravenous contrast.  Comparison: 04/21/2012  Findings: There is no evidence of renal or ureteral obstruction. There are small upper and lower pole nonobstructing calculi in the left kidney.  The bladder is unremarkable.  Unenhanced appearance of the solid organs and bowel are unremarkable.  No acute inflammatory process, abnormal fluid collection, mass, lymphadenopathy or hernia is identified. Extensive thoracolumbar fusion hardware is again noted with a spacer present related to prior T12 corpectomy.  Chronic sclerosis at the L5-S1 level also again noted implying potential pseudarthrosis.  IMPRESSION: No acute findings.  No evidence of renal or ureteral obstruction. Small nonobstructing calculi are present in the left kidney.   Original Report Authenticated By: Irish Lack, M.D.     1. UTI (lower urinary tract infection)      MDM  Pt with right flank pain and urinary symptoms that began last night.  UA shows infection, culture sent. CT shows no e/o stone.  Definitely has UTI, possibly early pyelonephritis.  He is afebrile, nontoxic.  HR and BP improved with pain medications.  Pt to be d/c home with phenergan and cipro x 2 weeks.  Pt has strong narcotics at home, is on pain contract.  I think once he can keep his medications down he will have better pain control.  He is to follow up with Dr Vernie Ammons.  Discussed all results with patient.  Pt given return precautions.  Pt verbalizes understanding and agrees with plan.           [place], PA-C 03/15/13 [phone]

## 2013-03-15 NOTE — ED Notes (Signed)
Pt presents to department for evaluation of R sided flank pain. Onset last night @10pm . Also reports burning with urination and  hematuria. 10/10 pain at the time. History of kidney stones. Pt is alert and oriented x4. Ambulatory to triage.

## 2013-03-16 ENCOUNTER — Emergency Department (HOSPITAL_COMMUNITY): Payer: Medicare Other

## 2013-03-16 ENCOUNTER — Emergency Department (HOSPITAL_COMMUNITY)
Admission: EM | Admit: 2013-03-16 | Discharge: 2013-03-16 | Disposition: A | Discharge status: Home / Self Care | Payer: Medicare Other | Attending: Emergency Medicine | Admitting: Emergency Medicine

## 2013-03-16 ENCOUNTER — Encounter (HOSPITAL_COMMUNITY): Payer: Self-pay | Admitting: *Deleted

## 2013-03-16 DIAGNOSIS — M549 Dorsalgia, unspecified: Secondary | ICD-10-CM | POA: Insufficient documentation

## 2013-03-16 DIAGNOSIS — R42 Dizziness and giddiness: Secondary | ICD-10-CM | POA: Insufficient documentation

## 2013-03-16 DIAGNOSIS — G8929 Other chronic pain: Secondary | ICD-10-CM | POA: Insufficient documentation

## 2013-03-16 DIAGNOSIS — N390 Urinary tract infection, site not specified: Secondary | ICD-10-CM | POA: Insufficient documentation

## 2013-03-16 DIAGNOSIS — R6883 Chills (without fever): Secondary | ICD-10-CM | POA: Insufficient documentation

## 2013-03-16 DIAGNOSIS — Z79899 Other long term (current) drug therapy: Secondary | ICD-10-CM | POA: Insufficient documentation

## 2013-03-16 DIAGNOSIS — R112 Nausea with vomiting, unspecified: Secondary | ICD-10-CM | POA: Insufficient documentation

## 2013-03-16 DIAGNOSIS — N23 Unspecified renal colic: Secondary | ICD-10-CM | POA: Insufficient documentation

## 2013-03-16 DIAGNOSIS — R1031 Right lower quadrant pain: Secondary | ICD-10-CM | POA: Insufficient documentation

## 2013-03-16 DIAGNOSIS — Z87442 Personal history of urinary calculi: Secondary | ICD-10-CM | POA: Insufficient documentation

## 2013-03-16 DIAGNOSIS — R61 Generalized hyperhidrosis: Secondary | ICD-10-CM | POA: Insufficient documentation

## 2013-03-16 DIAGNOSIS — F988 Other specified behavioral and emotional disorders with onset usually occurring in childhood and adolescence: Secondary | ICD-10-CM | POA: Insufficient documentation

## 2013-03-16 DIAGNOSIS — F17200 Nicotine dependence, unspecified, uncomplicated: Secondary | ICD-10-CM | POA: Insufficient documentation

## 2013-03-16 LAB — URINALYSIS, ROUTINE W REFLEX MICROSCOPIC
Bilirubin Urine: NEGATIVE
Ketones, ur: NEGATIVE mg/dL
Nitrite: NEGATIVE
Specific Gravity, Urine: 1.017 (ref 1.005–1.030)
Urobilinogen, UA: 0.2 mg/dL (ref 0.0–1.0)

## 2013-03-16 LAB — URINE CULTURE

## 2013-03-16 LAB — COMPREHENSIVE METABOLIC PANEL
Albumin: 4 g/dL (ref 3.5–5.2)
BUN: 13 mg/dL (ref 6–23)
Creatinine, Ser: 1.16 mg/dL (ref 0.50–1.35)
Total Protein: 7.8 g/dL (ref 6.0–8.3)

## 2013-03-16 LAB — CBC WITH DIFFERENTIAL/PLATELET
Basophils Relative: 0 % (ref 0–1)
Eosinophils Absolute: 0 10*3/uL (ref 0.0–0.7)
HCT: 47 % (ref 39.0–52.0)
Hemoglobin: 17.4 g/dL — ABNORMAL HIGH (ref 13.0–17.0)
MCH: 32.3 pg (ref 26.0–34.0)
MCHC: 37 g/dL — ABNORMAL HIGH (ref 30.0–36.0)
Monocytes Absolute: 0.6 10*3/uL (ref 0.1–1.0)
Monocytes Relative: 5 % (ref 3–12)
Neutrophils Relative %: 81 % — ABNORMAL HIGH (ref 43–77)
RDW: 13.2 % (ref 11.5–15.5)

## 2013-03-16 LAB — LIPASE, BLOOD: Lipase: 29 U/L (ref 11–59)

## 2013-03-16 LAB — URINE MICROSCOPIC-ADD ON

## 2013-03-16 MED ORDER — KETOROLAC TROMETHAMINE 30 MG/ML IJ SOLN
30.0000 mg | Freq: Once | INTRAMUSCULAR | Status: AC
  Administered 2013-03-16: 30 mg via INTRAVENOUS
  Filled 2013-03-16: qty 1

## 2013-03-16 MED ORDER — PROMETHAZINE HCL 25 MG/ML IJ SOLN
12.5000 mg | Freq: Once | INTRAMUSCULAR | Status: AC
  Administered 2013-03-16: 12.5 mg via INTRAVENOUS
  Filled 2013-03-16 (×2): qty 1

## 2013-03-16 MED ORDER — SODIUM CHLORIDE 0.9 % IV BOLUS (SEPSIS)
1000.0000 mL | Freq: Once | INTRAVENOUS | Status: AC
  Administered 2013-03-16: 1000 mL via INTRAVENOUS

## 2013-03-16 MED ORDER — IOHEXOL 300 MG/ML  SOLN
50.0000 mL | Freq: Once | INTRAMUSCULAR | Status: AC | PRN
  Administered 2013-03-16: 50 mL via ORAL

## 2013-03-16 MED ORDER — HYDROMORPHONE HCL PF 1 MG/ML IJ SOLN
1.0000 mg | Freq: Once | INTRAMUSCULAR | Status: AC
  Administered 2013-03-16: 1 mg via INTRAVENOUS
  Filled 2013-03-16: qty 1

## 2013-03-16 MED ORDER — IOHEXOL 300 MG/ML  SOLN
100.0000 mL | Freq: Once | INTRAMUSCULAR | Status: AC | PRN
  Administered 2013-03-16: 100 mL via INTRAVENOUS

## 2013-03-16 MED ORDER — ONDANSETRON HCL 4 MG/2ML IJ SOLN
4.0000 mg | Freq: Once | INTRAMUSCULAR | Status: AC
  Administered 2013-03-16: 4 mg via INTRAVENOUS
  Filled 2013-03-16: qty 2

## 2013-03-16 MED ORDER — TAMSULOSIN HCL 0.4 MG PO CAPS: 0.4000 mg | ORAL_CAPSULE | Freq: Every day | ORAL | Status: DC | PRN

## 2013-03-16 MED ORDER — HYDROMORPHONE HCL PF 1 MG/ML IJ SOLN
0.5000 mg | Freq: Once | INTRAMUSCULAR | Status: AC
  Administered 2013-03-16: 0.5 mg via INTRAVENOUS
  Filled 2013-03-16: qty 1

## 2013-03-16 NOTE — ED Provider Notes (Signed)
History     CSN: [id number]  Arrival date & time 03/16/13  1511   First MD Initiated Contact with Patient 03/16/13 1528      Chief Complaint  Patient presents with  . Emesis    (Consider location/radiation/quality/duration/timing/severity/associated sxs/prior treatment) HPI Comments: Patient is a 37 year old male with history of chronic back pain and kidney stones who presents for dysuria, right lower quadrant pain x2 days. Patient was seen yesterday for dysuria and diagnosed with urinary tract infection. Patient was discharged with Phenergan and Cipro. He has taken three doses of cipro and the phenergan has not controlled his nausea and emesis. Patient states that he started having multiple episodes of nonbloody, nonbilious emesis at 9 PM last night. Patient endorses that he has been vomiting every 10 minutes since 9 PM yesterday. He describes the pain as right lower cautery as aching with intermittent sharp sensation radiating to his right suprapubic region. He states that movement and pushing on the area makes it worse. He denies any alleviating factors. Patient admits to associated diaphoresis and chills as well as lightheadedness and dizziness. He also admits to mid and lower aching sensation in his back, worsened with dry heaving and vomiting. He denies fever, visual disturbances, syncope, difficulty swallowing, chest pain, shortness of breath, diarrhea, melena, hematochezia, scrotal or penile swelling, or numbness or tingling in his extremities.  Patient is a 37 y.o. male presenting with vomiting. The history is provided by the patient. No language interpreter was used.  Emesis Associated symptoms: abdominal pain and chills   Associated symptoms: no diarrhea     Past Medical History  Diagnosis Date  . Chronic back pain   . Kidney stones   . Kidney stones   . ADD (attention deficit disorder)     Past Surgical History  Procedure Laterality Date  . Back surgery    . Hand surgery     . Hip surgery      Family History  Problem Relation Age of Onset  . Diabetes Other   . Stroke Other     History  Substance Use Topics  . Smoking status: Current Every Day Smoker -- 0.50 packs/day    Types: Cigarettes  . Smokeless tobacco: Not on file  . Alcohol Use: No     Review of Systems  Constitutional: Positive for chills, diaphoresis and fatigue. Negative for fever.  Eyes: Negative for visual disturbance.  Respiratory: Negative for shortness of breath.   Cardiovascular: Negative for chest pain.  Gastrointestinal: Positive for nausea, vomiting and abdominal pain. Negative for diarrhea and blood in stool.  Genitourinary: Positive for dysuria and hematuria. Negative for discharge, penile swelling, scrotal swelling, penile pain and testicular pain.  Musculoskeletal: Positive for back pain.  Skin: Negative for color change and pallor.  Neurological: Positive for dizziness, weakness and light-headedness. Negative for numbness.  All other systems reviewed and are negative.    Allergies  Reglan  Home Medications   Current Outpatient Rx  Name  Route  Sig  Dispense  Refill  . amphetamine-dextroamphetamine (ADDERALL) 30 MG tablet   Oral   Take 30 mg by mouth 2 (two) times daily as needed (ADD).          . ciprofloxacin (CIPRO) 500 MG tablet   Oral   Take 500 mg by mouth 2 (two) times daily. Starting on 03/15/13 for 10 days         . oxycodone (ROXICODONE) 30 MG immediate release tablet   Oral   Take  30 mg by mouth every 4 (four) hours as needed for pain.         Marland Kitchen oxymorphone (OPANA ER) 40 MG 12 hr tablet   Oral   Take 1 tablet (40 mg total) by mouth every 8 (eight) hours.   60 tablet   0   . promethazine (PHENERGAN) 25 MG tablet   Oral   Take 1 tablet (25 mg total) by mouth every 6 (six) hours as needed for nausea.   20 tablet   0   . SUMAtriptan (IMITREX) 6 MG/0.5ML SOLN injection   Subcutaneous   Inject 6 mg into the skin every 2 (two) hours as  needed for migraine or headache.          . tamsulosin (FLOMAX) 0.4 MG CAPS   Oral   Take 1 capsule (0.4 mg total) by mouth daily as needed.   10 capsule   0     BP 126/81  Pulse 68  Temp(Src) 98.3 F (36.8 C) (Oral)  Resp 16  SpO2 99%  Physical Exam  Nursing note and vitals reviewed. Constitutional: He is oriented to person, place, and time. He appears well-developed and well-nourished.  Patient laying on his left side in the bed, visibly uncomfortable. Patient no acute distress and nontoxic appearing.  HENT:  Head: Normocephalic and atraumatic.  Mouth/Throat: No oropharyngeal exudate.  Oropharynx clear and dry  Eyes: Conjunctivae are normal. Pupils are equal, round, and reactive to light. No scleral icterus.  Neck: Normal range of motion. Neck supple.  Cardiovascular: Normal rate, regular rhythm, normal heart sounds and intact distal pulses.   Patient is not tachycardic as recorded in triage. Distal pulses 2+ b/l  Pulmonary/Chest: Effort normal and breath sounds normal. No respiratory distress. He has no wheezes. He has no rales.  Abdominal: Soft. There is tenderness. There is no rebound and no guarding.  Patient exquisitely tender on light and deep palpation of his right lower quadrant. No peritoneal signs. Bowel sounds   Musculoskeletal: Normal range of motion.  Lymphadenopathy:    He has no cervical adenopathy.  Neurological: He is alert and oriented to person, place, and time.  Skin: Skin is warm and dry. No rash noted. No erythema.  Psychiatric: He has a normal mood and affect. His behavior is normal.    ED Course  Procedures (including critical care time)  Labs Reviewed  CBC WITH DIFFERENTIAL - Abnormal; Notable for the following:    WBC 12.4 (*)    Hemoglobin 17.4 (*)    MCHC 37.0 (*)    Neutrophils Relative 81 (*)    Neutro Abs 10.0 (*)    All other components within normal limits  COMPREHENSIVE METABOLIC PANEL - Abnormal; Notable for the following:     GFR calc non Af Amer 78 (*)    All other components within normal limits  URINALYSIS, ROUTINE W REFLEX MICROSCOPIC - Abnormal; Notable for the following:    APPearance HAZY (*)    Hgb urine dipstick LARGE (*)    Leukocytes, UA SMALL (*)    All other components within normal limits  URINE MICROSCOPIC-ADD ON - Abnormal; Notable for the following:    Squamous Epithelial / LPF FEW (*)    Bacteria, UA FEW (*)    All other components within normal limits  URINE CULTURE  LIPASE, BLOOD   No results found.  Ct Abdomen Pelvis W Contrast  03/16/2013  *RADIOLOGY REPORT*  Clinical Data: Left-sided flank and abdomen pain, vomiting  CT  ABDOMEN AND PELVIS WITH CONTRAST  Technique:  Multidetector CT imaging of the abdomen and pelvis was performed following the standard protocol during bolus administration of intravenous contrast.  Contrast: OMNIPAQUE IOHEXOL 300 MG/ML  SOLN  Comparison: CT abdomen pelvis of 03/15/2013  Findings: Minimal scarring in the lung bases remains.  No active infiltrate or effusion is seen in the lung bases.  The liver enhances with no focal abnormality and no ductal dilatation is seen.  No calcified gallstones are seen.  The pancreas is normal in size and the pancreatic duct is not dilated.  The adrenal glands and spleen are unremarkable.  The stomach is decompressed.  The kidneys enhance and a nonobstructing 4 mm calculus is again noted in the upper pole of the left kidney.  A 5 mm nonobstructing calculus also remains in the lower pole of the left kidney.  No definite right renal calculi are seen.  On delayed images the pelvocaliceal systems are unremarkable with no evidence of hydronephrosis.  The proximal ureters are normal in caliber.  The abdominal aorta is normal in caliber.  No adenopathy is seen.  The distal ureters are normal in caliber and no distal ureteral calculus is seen.  The urinary bladder is not well distended but no abnormality is noted.  The prostate is normal in  size.  No fluid is seen within the pelvis.  No abnormality of the colon is seen.  The terminal ileum and the appendix are well visualized with no abnormality noted. Extensive hardware is present throughout the lower thoracic and entire lumbar spine for fusion with a metallic strut noted across T12.  IMPRESSION:  1.  No explanation for the patient's left sided pain is seen. 2.  Two nonobstructing left renal calculi as noted above. 3.  The appendix and terminal ileum are unremarkable. 4.  Extensive hardware for fusion of the lower thoracic and entire lumbar spine with metallic strut at the T12 level.   Original Report Authenticated By: Dwyane Dee, M.D.     1. Renal colic on right side     MDM  Patient presents for right lower quadrant pain with nausea and multiple episodes of nonbloody, nonbilious emesis with the onset at 9 PM last night. Patient was seen in the emergency department yesterday and worked up for a kidney stone; no evidence of calculi on CT scan. Patient was discharged with Phenergan and Cipro as urine showed moderate leukocytes as well as many bacteria. These UA results, however, appear more consistent with contamination rather than pyelonephritis. Culture pending. On examination today patient's exam significant for exquisite focal tenderness on palpation of the right lower quadrant at McBurney's point. W/u to include labs and CT abdomen/pelvis with contrast for evaluation of possible appendicitis.  Patient's symptoms controlled with IV dilaudid and zofran. Labs significant for leukocytosis of 12.4. Urinalysis with 11--20 RBCs, few bacteria and few leukocytes. CT scan without evidence of acute appendicitis or any other acute abnormality to explain the patient's discomfort; appendix well visualized on CT. Patient is tolerating PO fluids without emesis since arrival, hemodynamically stable, and nontoxic appearing. Given RBCs in urine possible pain is 2/2 passing or recently passed kidney stone.  Patient for d/c with PCP follow up for further evaluation of symptoms. Patient given phenergan yesterday and has narcotic pain medication at home for chronic back pain; will prescribe flomax as may improve symptoms if kidney stone has yet to pass. Patient states comfort and understanding of this d/c plan with no unaddressed concerns. Indications  for ED return discussed. Patient seen also by Dr. Manus Gunning with whom this work up and management was discussed.   Filed Vitals:   03/16/13 1941 03/16/13 2000 03/16/13 2030 03/16/13 2043  BP:  132/87 132/82 126/81  Pulse: 62 65 68 68  Temp:    98.3 F (36.8 C)  TempSrc:    Oral  Resp:    16  SpO2: 100% 100% 98% 99%         Antony Madura, PA-C 03/19/13 1053

## 2013-03-16 NOTE — ED Provider Notes (Signed)
Medical screening examination/treatment/procedure(s) were performed by non-physician practitioner and as supervising physician I was immediately available for consultation/collaboration.   Carleene Cooper III, MD 03/16/13 [phone]

## 2013-03-16 NOTE — ED Notes (Signed)
Pt was diagnosed with kidney infection yesterday here and placed on anbx.  Pt reports vomiting, weakness, chills.

## 2013-03-16 NOTE — ED Notes (Signed)
Pt reports mid to lower back pain and abdominal pain

## 2013-03-17 LAB — URINE CULTURE: Colony Count: >=100000

## 2013-03-18 ENCOUNTER — Emergency Department (HOSPITAL_COMMUNITY)
Admission: EM | Admit: 2013-03-18 | Discharge: 2013-03-18 | Disposition: A | Discharge status: Home / Self Care | Payer: Medicare Other | Attending: Emergency Medicine | Admitting: Emergency Medicine

## 2013-03-18 ENCOUNTER — Encounter (HOSPITAL_COMMUNITY): Payer: Self-pay | Admitting: Emergency Medicine

## 2013-03-18 DIAGNOSIS — Z87442 Personal history of urinary calculi: Secondary | ICD-10-CM | POA: Insufficient documentation

## 2013-03-18 DIAGNOSIS — M545 Low back pain, unspecified: Secondary | ICD-10-CM | POA: Insufficient documentation

## 2013-03-18 DIAGNOSIS — G8929 Other chronic pain: Secondary | ICD-10-CM | POA: Insufficient documentation

## 2013-03-18 DIAGNOSIS — R319 Hematuria, unspecified: Secondary | ICD-10-CM | POA: Insufficient documentation

## 2013-03-18 DIAGNOSIS — F988 Other specified behavioral and emotional disorders with onset usually occurring in childhood and adolescence: Secondary | ICD-10-CM | POA: Insufficient documentation

## 2013-03-18 DIAGNOSIS — Z79899 Other long term (current) drug therapy: Secondary | ICD-10-CM | POA: Insufficient documentation

## 2013-03-18 DIAGNOSIS — N390 Urinary tract infection, site not specified: Secondary | ICD-10-CM | POA: Insufficient documentation

## 2013-03-18 DIAGNOSIS — F17200 Nicotine dependence, unspecified, uncomplicated: Secondary | ICD-10-CM | POA: Insufficient documentation

## 2013-03-18 DIAGNOSIS — R109 Unspecified abdominal pain: Secondary | ICD-10-CM | POA: Insufficient documentation

## 2013-03-18 DIAGNOSIS — R1110 Vomiting, unspecified: Secondary | ICD-10-CM

## 2013-03-18 LAB — URINALYSIS, MICROSCOPIC ONLY
Glucose, UA: NEGATIVE mg/dL
Ketones, ur: NEGATIVE mg/dL
Specific Gravity, Urine: 1.024 (ref 1.005–1.030)
pH: 6.5 (ref 5.0–8.0)

## 2013-03-18 LAB — COMPREHENSIVE METABOLIC PANEL
AST: 16 U/L (ref 0–37)
Albumin: 4 g/dL (ref 3.5–5.2)
Alkaline Phosphatase: 82 U/L (ref 39–117)
Chloride: 103 mEq/L (ref 96–112)
Creatinine, Ser: 1.05 mg/dL (ref 0.50–1.35)
Potassium: 3.6 mEq/L (ref 3.5–5.1)
Total Bilirubin: 0.7 mg/dL (ref 0.3–1.2)
Total Protein: 7.3 g/dL (ref 6.0–8.3)

## 2013-03-18 LAB — OCCULT BLOOD, POC DEVICE: Fecal Occult Bld: NEGATIVE

## 2013-03-18 LAB — CBC WITH DIFFERENTIAL/PLATELET
Basophils Absolute: 0 10*3/uL (ref 0.0–0.1)
Basophils Relative: 0 % (ref 0–1)
MCHC: 37.1 g/dL — ABNORMAL HIGH (ref 30.0–36.0)
Neutro Abs: 7.9 10*3/uL — ABNORMAL HIGH (ref 1.7–7.7)
Neutrophils Relative %: 73 % (ref 43–77)
Platelets: 289 10*3/uL (ref 150–400)
RDW: 12.9 % (ref 11.5–15.5)

## 2013-03-18 MED ORDER — ONDANSETRON HCL 4 MG/2ML IJ SOLN
4.0000 mg | Freq: Once | INTRAMUSCULAR | Status: AC
  Administered 2013-03-18: 4 mg via INTRAVENOUS
  Filled 2013-03-18: qty 2

## 2013-03-18 MED ORDER — MORPHINE SULFATE 4 MG/ML IJ SOLN
4.0000 mg | Freq: Once | INTRAMUSCULAR | Status: AC
  Administered 2013-03-18: 4 mg via INTRAVENOUS
  Filled 2013-03-18: qty 1

## 2013-03-18 MED ORDER — PROMETHAZINE HCL 25 MG/ML IJ SOLN
25.0000 mg | Freq: Once | INTRAMUSCULAR | Status: AC
  Administered 2013-03-18: 25 mg via INTRAVENOUS
  Filled 2013-03-18: qty 1

## 2013-03-18 NOTE — ED Provider Notes (Signed)
History     CSN: [id number]  Arrival date & time 03/18/13  1842   First MD Initiated Contact with Patient 03/18/13 2012      Chief Complaint  Patient presents with  . Emesis  . Hematuria    (Consider location/radiation/quality/duration/timing/severity/associated sxs/prior treatment) HPI Comments: This is a 37 year old male with history of chronic low back pain and kidney stones who presents to the ED today with significant abdominal pain and hematemesis. The pain is sharp and pulling, mostly on his right side. He has been treated for a UTI that has been going on for about 5 days. He states he is compliant with her Cipro, but has recently missed doses because yesterday he began vomiting up blood. He states his dysuria and gross hematuria are unimproved. He is on a pain contract, but states due to the vomiting he has not been able to keep any of his pain medication down. No fever, but he has chills.   The history is provided by the patient. No language interpreter was used.    Past Medical History  Diagnosis Date  . Chronic back pain   . Kidney stones   . Kidney stones   . ADD (attention deficit disorder)     Past Surgical History  Procedure Laterality Date  . Back surgery    . Hand surgery    . Hip surgery      Family History  Problem Relation Age of Onset  . Diabetes Other   . Stroke Other     History  Substance Use Topics  . Smoking status: Current Every Day Smoker -- 0.50 packs/day    Types: Cigarettes  . Smokeless tobacco: Not on file  . Alcohol Use: No      Review of Systems  Constitutional: Positive for chills. Negative for fever.  Respiratory: Negative for shortness of breath.   Cardiovascular: Negative for chest pain.  Gastrointestinal: Positive for nausea, vomiting and abdominal pain. Negative for diarrhea and constipation.  Musculoskeletal: Positive for back pain.  All other systems reviewed and are negative.    Allergies  Reglan  Home  Medications   Current Outpatient Rx  Name  Route  Sig  Dispense  Refill  . amphetamine-dextroamphetamine (ADDERALL) 30 MG tablet   Oral   Take 30 mg by mouth 2 (two) times daily as needed (ADD).          . ciprofloxacin (CIPRO) 500 MG tablet   Oral   Take 500 mg by mouth 2 (two) times daily. Starting on 03/15/13 for 10 days         . oxycodone (ROXICODONE) 30 MG immediate release tablet   Oral   Take 30 mg by mouth every 4 (four) hours as needed for pain.         Marland Kitchen oxymorphone (OPANA ER) 40 MG 12 hr tablet   Oral   Take 1 tablet (40 mg total) by mouth every 8 (eight) hours.   60 tablet   0   . promethazine (PHENERGAN) 25 MG tablet   Oral   Take 1 tablet (25 mg total) by mouth every 6 (six) hours as needed for nausea.   20 tablet   0   . SUMAtriptan (IMITREX) 6 MG/0.5ML SOLN injection   Subcutaneous   Inject 6 mg into the skin every 2 (two) hours as needed for migraine or headache.          . tamsulosin (FLOMAX) 0.4 MG CAPS   Oral  Take 1 capsule (0.4 mg total) by mouth daily as needed.   10 capsule   0     BP 128/80  Pulse 56  Temp(Src) 98.8 F (37.1 C) (Oral)  Resp 14  SpO2 99%  Physical Exam  Nursing note and vitals reviewed. Constitutional: He is oriented to person, place, and time. He appears well-developed and well-nourished. He appears distressed.  HENT:  Head: Normocephalic and atraumatic.  Right Ear: External ear normal.  Left Ear: External ear normal.  Nose: Nose normal.  Eyes: Conjunctivae are normal.  Neck: Normal range of motion. No tracheal deviation present.  Cardiovascular: Normal rate, regular rhythm and normal heart sounds.   Pulmonary/Chest: Effort normal and breath sounds normal. No stridor.  Abdominal: Soft. Normal appearance and bowel sounds are normal. He exhibits no distension. There is tenderness in the right upper quadrant and right lower quadrant. There is no rebound, no guarding and no CVA tenderness.  Musculoskeletal:  Normal range of motion.  Neurological: He is alert and oriented to person, place, and time.  Skin: Skin is warm and dry. He is not diaphoretic.  Psychiatric: He has a normal mood and affect. His behavior is normal.    ED Course  Procedures (including critical care time)  Labs Reviewed  CBC WITH DIFFERENTIAL - Abnormal; Notable for the following:    WBC 10.6 (*)    MCHC 37.1 (*)    Neutro Abs 7.9 (*)    All other components within normal limits  COMPREHENSIVE METABOLIC PANEL - Abnormal; Notable for the following:    GFR calc non Af Amer 88 (*)    All other components within normal limits  URINALYSIS, MICROSCOPIC ONLY - Abnormal; Notable for the following:    Color, Urine RED (*)    APPearance CLOUDY (*)    Hgb urine dipstick LARGE (*)    Protein, ur 100 (*)    Leukocytes, UA MODERATE (*)    Bacteria, UA FEW (*)    Squamous Epithelial / LPF MANY (*)    All other components within normal limits  URINE CULTURE  LIPASE, BLOOD  GLUCOSE, CAPILLARY  OCCULT BLOOD, POC DEVICE   No results found.   Date: 03/19/2013  Rate: 59  Rhythm: normal sinus rhythm  QRS Axis: normal  Intervals: normal  ST/T Wave abnormalities: normal  Conduction Disutrbances:none  Narrative Interpretation:   Old EKG Reviewed: unchanged   1. UTI (lower urinary tract infection)   2. Abdominal pain   3. Vomiting       MDM  Patient presents with unresolved abdominal pain and hematuria with new onset hematemesis. Unremarkable CT of abdomen done 2 days ago. Hemoccult negative. No concern for GI bleed. Possible Mallory-Weiss tear. WBC is improving. He is taking his cipro. It appears that his UTI is improving. No concern for pyelonephritis. No rx given for pain because he is on a pain contract. 4mg  morphine given in ED. Return instructions given. Follow up with PCP. Vital signs stable for discharge. Patient / Family / Caregiver informed of clinical course, understand medical decision-making process, and agree  with plan.        Mora Bellman, PA-C 03/19/13 1129

## 2013-03-18 NOTE — ED Notes (Signed)
C/o RLQ abd pain, vomiting blood and hematuria x 2-3 days.

## 2013-03-18 NOTE — ED Notes (Signed)
Syncopal episode observed by Felipa Eth, RN when pt ambulatory to bathroom.

## 2013-03-19 LAB — URINE CULTURE: Colony Count: >=100000

## 2013-03-19 NOTE — ED Provider Notes (Signed)
Medical screening examination/treatment/procedure(s) were conducted as a shared visit with non-physician practitioner(s) and myself.  I personally evaluated the patient during the encounter  Recent visit for flank pain, treated for UTI. Returns with flank pain, vomiting. R CVAT with RLQ pain and guarding.  Glynn Octave, MD 03/19/13 [phone]

## 2013-03-20 ENCOUNTER — Telehealth (HOSPITAL_COMMUNITY): Payer: Self-pay | Admitting: Emergency Medicine

## 2013-03-20 NOTE — ED Notes (Signed)
Patient has +Urine culture. °

## 2013-03-20 NOTE — ED Provider Notes (Signed)
Medical screening examination/treatment/procedure(s) were conducted as a shared visit with non-physician practitioner(s) and myself.  I personally evaluated the patient during the encounter  Pt in no distress. Abdomen soft on my exam, advised him that he will need f/u as outpatient  Joya Gaskins, MD 03/20/13 [phone]

## 2013-03-20 NOTE — ED Notes (Signed)
+   Urine Chart sent to EDP office for review. 

## 2013-03-22 ENCOUNTER — Telehealth (HOSPITAL_COMMUNITY): Payer: Self-pay | Admitting: Emergency Medicine

## 2013-03-22 NOTE — ED Notes (Signed)
Unable to contact patient via phone. Sent letter. °

## 2013-03-22 NOTE — ED Notes (Signed)
Chart returned from EDP office. Per Robert Browning PA-C, start Keflex 500 mg QID x 7 days. #28. °

## 2013-04-26 ENCOUNTER — Telehealth (HOSPITAL_COMMUNITY): Payer: Self-pay | Admitting: Emergency Medicine

## 2013-04-26 NOTE — ED Notes (Signed)
No response to letter sent after 30 days. Chart sent to Medical Records. °

## 2013-08-09 ENCOUNTER — Emergency Department (HOSPITAL_COMMUNITY)
Admission: EM | Admit: 2013-08-09 | Discharge: 2013-08-09 | Disposition: A | Discharge status: Home / Self Care | Payer: Medicare Other | Attending: Emergency Medicine | Admitting: Emergency Medicine

## 2013-08-09 ENCOUNTER — Emergency Department (HOSPITAL_COMMUNITY): Payer: Medicare Other

## 2013-08-09 ENCOUNTER — Encounter (HOSPITAL_COMMUNITY): Payer: Self-pay | Admitting: Physical Medicine and Rehabilitation

## 2013-08-09 DIAGNOSIS — F988 Other specified behavioral and emotional disorders with onset usually occurring in childhood and adolescence: Secondary | ICD-10-CM | POA: Insufficient documentation

## 2013-08-09 DIAGNOSIS — Z87891 Personal history of nicotine dependence: Secondary | ICD-10-CM | POA: Insufficient documentation

## 2013-08-09 DIAGNOSIS — R52 Pain, unspecified: Secondary | ICD-10-CM | POA: Insufficient documentation

## 2013-08-09 DIAGNOSIS — M546 Pain in thoracic spine: Secondary | ICD-10-CM | POA: Insufficient documentation

## 2013-08-09 DIAGNOSIS — Z9889 Other specified postprocedural states: Secondary | ICD-10-CM | POA: Insufficient documentation

## 2013-08-09 DIAGNOSIS — Z79899 Other long term (current) drug therapy: Secondary | ICD-10-CM | POA: Insufficient documentation

## 2013-08-09 DIAGNOSIS — Z8611 Personal history of tuberculosis: Secondary | ICD-10-CM | POA: Insufficient documentation

## 2013-08-09 LAB — URINALYSIS, ROUTINE W REFLEX MICROSCOPIC
Glucose, UA: NEGATIVE mg/dL
Ketones, ur: NEGATIVE mg/dL
pH: 5.5 (ref 5.0–8.0)

## 2013-08-09 LAB — URINE MICROSCOPIC-ADD ON

## 2013-08-09 MED ORDER — CYCLOBENZAPRINE HCL 10 MG PO TABS: 10.0000 mg | ORAL_TABLET | Freq: Two times a day (BID) | ORAL | Status: DC | PRN

## 2013-08-09 MED ORDER — OXYCODONE-ACETAMINOPHEN 5-325 MG PO TABS: 1.0000 | ORAL_TABLET | ORAL | Status: DC | PRN

## 2013-08-09 MED ORDER — ORPHENADRINE CITRATE 30 MG/ML IJ SOLN: 60.0000 mg | Freq: Two times a day (BID) | INTRAMUSCULAR | Status: DC

## 2013-08-09 MED ORDER — HYDROMORPHONE HCL PF 2 MG/ML IJ SOLN
2.0000 mg | Freq: Once | INTRAMUSCULAR | Status: AC
  Administered 2013-08-09: 2 mg via INTRAMUSCULAR
  Filled 2013-08-09: qty 1

## 2013-08-09 MED ORDER — METHOCARBAMOL 100 MG/ML IJ SOLN
500.0000 mg | Freq: Two times a day (BID) | INTRAMUSCULAR | Status: DC
  Administered 2013-08-09: 500 mg via INTRAMUSCULAR
  Filled 2013-08-09 (×3): qty 5

## 2013-08-09 NOTE — ED Notes (Signed)
Pt presents to department for evaluation of R sided back pain. States he fell changing light bulb last night. States he fell on stairs outside of shower. 9/10 pain at the time, increases with movement. Pt is conscious alert and oriented x4. Ambulatory to triage.

## 2013-08-09 NOTE — ED Provider Notes (Signed)
CSN: [id number]     Arrival date & time 08/09/13  0706 History   First MD Initiated Contact with Patient 08/09/13 [phone]     Chief Complaint  Patient presents with  . Back Pain   (Consider location/radiation/quality/duration/timing/severity/associated sxs/prior Treatment) Patient is a 37 y.o. male presenting with back pain. The history is provided by the patient.  Back Pain Pain location: right thoracic. Radiates to:  Does not radiate Pain severity:  Severe Pain is:  Same all the time Onset quality:  Sudden Duration:  12 hours Timing:  Constant Progression:  Unchanged Chronicity:  New Context: recent injury   Relieved by:  Nothing Ineffective treatments: goody powder. Associated symptoms: no abdominal pain, no bladder incontinence, no bowel incontinence, no dysuria, no fever, no leg pain, no numbness, no paresthesias and no weakness     Past Medical History  Diagnosis Date  . Chronic back pain   . Kidney stones   . Kidney stones   . ADD (attention deficit disorder)    Past Surgical History  Procedure Laterality Date  . Back surgery    . Hand surgery    . Hip surgery     Family History  Problem Relation Age of Onset  . Diabetes Other   . Stroke Other    History  Substance Use Topics  . Smoking status: Former Smoker -- 0.50 packs/day  . Smokeless tobacco: Not on file  . Alcohol Use: No    Review of Systems  Constitutional: Negative for fever.  Gastrointestinal: Negative for nausea, vomiting, abdominal pain and bowel incontinence.  Genitourinary: Negative for bladder incontinence, dysuria and hematuria.  Musculoskeletal: Positive for back pain.  Neurological: Negative for weakness, numbness and paresthesias.  All other systems reviewed and are negative.    Allergies  Reglan and Toradol  Home Medications   Current Outpatient Rx  Name  Route  Sig  Dispense  Refill  . ALPRAZolam (XANAX) 1 MG tablet   Oral   Take 1 mg by mouth 3 (three) times daily.        Marland Kitchen amphetamine-dextroamphetamine (ADDERALL) 30 MG tablet   Oral   Take 30 mg by mouth 2 (two) times daily as needed (ADD).           BP 128/103  Pulse 72  Temp(Src) 98.7 F (37.1 C) (Oral)  SpO2 98% Physical Exam  Nursing note and vitals reviewed. Constitutional: He is oriented to person, place, and time. He appears well-developed and well-nourished.  HENT:  Head: Normocephalic and atraumatic.  Right Ear: External ear normal.  Left Ear: External ear normal.  Nose: Nose normal.  Eyes: Right eye exhibits no discharge. Left eye exhibits no discharge.  Neck: Neck supple.  Cardiovascular: Normal rate, regular rhythm, normal heart sounds and intact distal pulses.   Pulmonary/Chest: Effort normal.  Abdominal: Soft. He exhibits no distension. There is no tenderness.  Musculoskeletal: He exhibits no edema.       Thoracic back: He exhibits tenderness. He exhibits no bony tenderness.       Back:  Neurological: He is alert and oriented to person, place, and time. He has normal strength. No sensory deficit. He exhibits normal muscle tone.  Skin: Skin is warm and dry. No bruising and no ecchymosis noted.    ED Course  Procedures (including critical care time) Labs Review Labs Reviewed  URINALYSIS, ROUTINE W REFLEX MICROSCOPIC - Abnormal; Notable for the following:    APPearance CLOUDY (*)    Bilirubin Urine SMALL (*)  Leukocytes, UA TRACE (*)    All other components within normal limits  URINE MICROSCOPIC-ADD ON - Abnormal; Notable for the following:    Squamous Epithelial / LPF FEW (*)    Crystals CA OXALATE CRYSTALS (*)    All other components within normal limits   Imaging Review Dg Chest 2 View  08/09/2013   *RADIOLOGY REPORT*  Clinical Data: Fall, back pain  CHEST - 2 VIEW  Comparison: 11/16/2012  Findings: Normal heart size and vascularity.  Thoracic lumbar fusion hardware noted.  Lungs clear.  No effusion or pneumothorax. No osseous abnormality.  IMPRESSION: No acute  chest process.   Original Report Authenticated By: Judie Petit. Miles Costain, M.D.   Dg Thoracic Spine 2 View  08/09/2013   *RADIOLOGY REPORT*  Clinical Data: Fall, back pain, history of thoracic lumbar fusion and T12 corpectomy.  THORACIC SPINE - 2 VIEW  Comparison: 08/09/2013, 03/15/2013 CT reconstructions.  Findings: Thoracic lumbar fusion hardware noted and a prior T12 corpectomy.  Stable alignment.  No new compression fracture or definite hardware abnormality demonstrated.  No paraspinous soft tissue abnormality.  Lungs clear.  IMPRESSION: Stable postoperative findings and alignment.   Original Report Authenticated By: Judie Petit. Miles Costain, M.D.    MDM   1. Acute thoracic back pain    Patient states he has been out of the pain clinic for couple months and has become "clean". Here his neurologic exam is normal and has no midline tenderness. Without any red flags seems unlikely to be acute spinal cord issue. Due to patient's concern for hardware issues x-rays were obtained and are negative. Due to the location of the pain the urinalysis was obtained to evaluate for gross hematuria. No dysuria or other urinary sx to suggest UTI or kidney stone. His x-rays are benign and his urine is negative. Feel there is a low likelihood of kidney injury or significant thoracic injury. Will treat his musculoskeletal injury with symptom control and follow up with PCP.    Audree Camel, MD 08/09/13 1045

## 2013-08-28 ENCOUNTER — Emergency Department (HOSPITAL_COMMUNITY): Payer: Medicare Other

## 2013-08-28 ENCOUNTER — Other Ambulatory Visit: Payer: Self-pay

## 2013-08-28 ENCOUNTER — Inpatient Hospital Stay (HOSPITAL_COMMUNITY)
Admission: EM | Admit: 2013-08-28 | Discharge: 2013-08-31 | DRG: 918 | Disposition: A | Discharge status: Other Acute Inpatient Hospital | Payer: Medicare Other | Attending: Internal Medicine | Admitting: Internal Medicine

## 2013-08-28 ENCOUNTER — Encounter (HOSPITAL_COMMUNITY): Payer: Self-pay | Admitting: *Deleted

## 2013-08-28 ENCOUNTER — Other Ambulatory Visit: Payer: Medicare Other

## 2013-08-28 DIAGNOSIS — T43502A Poisoning by unspecified antipsychotics and neuroleptics, intentional self-harm, initial encounter: Secondary | ICD-10-CM | POA: Diagnosis present

## 2013-08-28 DIAGNOSIS — Z23 Encounter for immunization: Secondary | ICD-10-CM

## 2013-08-28 DIAGNOSIS — R001 Bradycardia, unspecified: Secondary | ICD-10-CM

## 2013-08-28 DIAGNOSIS — R9431 Abnormal electrocardiogram [ECG] [EKG]: Secondary | ICD-10-CM | POA: Diagnosis present

## 2013-08-28 DIAGNOSIS — T50902A Poisoning by unspecified drugs, medicaments and biological substances, intentional self-harm, initial encounter: Secondary | ICD-10-CM

## 2013-08-28 DIAGNOSIS — T50901A Poisoning by unspecified drugs, medicaments and biological substances, accidental (unintentional), initial encounter: Secondary | ICD-10-CM

## 2013-08-28 DIAGNOSIS — N200 Calculus of kidney: Secondary | ICD-10-CM

## 2013-08-28 DIAGNOSIS — T43591A Poisoning by other antipsychotics and neuroleptics, accidental (unintentional), initial encounter: Principal | ICD-10-CM | POA: Diagnosis present

## 2013-08-28 DIAGNOSIS — F988 Other specified behavioral and emotional disorders with onset usually occurring in childhood and adolescence: Secondary | ICD-10-CM

## 2013-08-28 DIAGNOSIS — R319 Hematuria, unspecified: Secondary | ICD-10-CM

## 2013-08-28 DIAGNOSIS — Z79899 Other long term (current) drug therapy: Secondary | ICD-10-CM

## 2013-08-28 DIAGNOSIS — F489 Nonpsychotic mental disorder, unspecified: Secondary | ICD-10-CM | POA: Diagnosis present

## 2013-08-28 DIAGNOSIS — G8929 Other chronic pain: Secondary | ICD-10-CM | POA: Diagnosis present

## 2013-08-28 DIAGNOSIS — F3130 Bipolar disorder, current episode depressed, mild or moderate severity, unspecified: Secondary | ICD-10-CM | POA: Diagnosis present

## 2013-08-28 DIAGNOSIS — F314 Bipolar disorder, current episode depressed, severe, without psychotic features: Secondary | ICD-10-CM

## 2013-08-28 DIAGNOSIS — Z87891 Personal history of nicotine dependence: Secondary | ICD-10-CM

## 2013-08-28 DIAGNOSIS — Z87442 Personal history of urinary calculi: Secondary | ICD-10-CM

## 2013-08-28 DIAGNOSIS — I498 Other specified cardiac arrhythmias: Secondary | ICD-10-CM

## 2013-08-28 DIAGNOSIS — M549 Dorsalgia, unspecified: Secondary | ICD-10-CM | POA: Diagnosis present

## 2013-08-28 DIAGNOSIS — T50904A Poisoning by unspecified drugs, medicaments and biological substances, undetermined, initial encounter: Secondary | ICD-10-CM

## 2013-08-28 DIAGNOSIS — R7402 Elevation of levels of lactic acid dehydrogenase (LDH): Secondary | ICD-10-CM | POA: Diagnosis present

## 2013-08-28 DIAGNOSIS — R7401 Elevation of levels of liver transaminase levels: Secondary | ICD-10-CM | POA: Diagnosis present

## 2013-08-28 HISTORY — DX: Opioid dependence, uncomplicated: F11.20

## 2013-08-28 HISTORY — DX: Opioid use, unspecified, uncomplicated: F11.90

## 2013-08-28 LAB — COMPREHENSIVE METABOLIC PANEL WITH GFR
ALT: 17 U/L (ref 0–53)
AST: 41 U/L — ABNORMAL HIGH (ref 0–37)
Albumin: 3.4 g/dL — ABNORMAL LOW (ref 3.5–5.2)
Alkaline Phosphatase: 89 U/L (ref 39–117)
BUN: 11 mg/dL (ref 6–23)
CO2: 24 meq/L (ref 19–32)
Calcium: 8.7 mg/dL (ref 8.4–10.5)
Chloride: 107 meq/L (ref 96–112)
Creatinine, Ser: 0.95 mg/dL (ref 0.50–1.35)
GFR calc Af Amer: >90 mL/min
GFR calc non Af Amer: >90 mL/min
Glucose, Bld: 94 mg/dL (ref 70–99)
Potassium: 4.5 meq/L (ref 3.5–5.1)
Sodium: 141 meq/L (ref 135–145)
Total Bilirubin: 0.2 mg/dL — ABNORMAL LOW (ref 0.3–1.2)
Total Protein: 6.5 g/dL (ref 6.0–8.3)

## 2013-08-28 LAB — URINALYSIS, ROUTINE W REFLEX MICROSCOPIC
Glucose, UA: NEGATIVE mg/dL
Ketones, ur: NEGATIVE mg/dL
Leukocytes, UA: NEGATIVE
Nitrite: NEGATIVE
Protein, ur: NEGATIVE mg/dL
Specific Gravity, Urine: 1.031 — ABNORMAL HIGH (ref 1.005–1.030)
Urobilinogen, UA: 1 mg/dL (ref 0.0–1.0)
pH: 6 (ref 5.0–8.0)

## 2013-08-28 LAB — RAPID URINE DRUG SCREEN, HOSP PERFORMED
Amphetamines: NOT DETECTED
Barbiturates: NOT DETECTED
Benzodiazepines: POSITIVE — AB
Cocaine: NOT DETECTED
Opiates: NOT DETECTED
Tetrahydrocannabinol: NOT DETECTED

## 2013-08-28 LAB — CBC
HCT: 44.7 % (ref 39.0–52.0)
Hemoglobin: 15.2 g/dL (ref 13.0–17.0)
MCH: 32.5 pg (ref 26.0–34.0)
MCHC: 34 g/dL (ref 30.0–36.0)
MCV: 95.7 fL (ref 78.0–100.0)
Platelets: 159 10*3/uL (ref 150–400)
RBC: 4.67 MIL/uL (ref 4.22–5.81)
RDW: 13.9 % (ref 11.5–15.5)
WBC: 8.9 10*3/uL (ref 4.0–10.5)

## 2013-08-28 LAB — URINE MICROSCOPIC-ADD ON

## 2013-08-28 LAB — ACETAMINOPHEN LEVEL: Acetaminophen (Tylenol), Serum: <15 ug/mL (ref 10–30)

## 2013-08-28 LAB — SALICYLATE LEVEL: Salicylate Lvl: <2 mg/dL — ABNORMAL LOW (ref 2.8–20.0)

## 2013-08-28 LAB — GLUCOSE, CAPILLARY: Glucose-Capillary: 91 mg/dL (ref 70–99)

## 2013-08-28 MED ORDER — SODIUM CHLORIDE 0.9 % IV SOLN
INTRAVENOUS | Status: DC
  Administered 2013-08-28: 23:00:00 via INTRAVENOUS

## 2013-08-28 MED ORDER — CARBAMAZEPINE 200 MG PO TABS
200.0000 mg | ORAL_TABLET | Freq: Three times a day (TID) | ORAL | Status: DC
Start: 2013-08-28 — End: 2013-08-31
  Administered 2013-08-28 – 2013-08-30 (×2): 200 mg via ORAL
  Filled 2013-08-28 (×10): qty 1

## 2013-08-28 MED ORDER — DIVALPROEX SODIUM 250 MG PO DR TAB
250.0000 mg | DELAYED_RELEASE_TABLET | Freq: Two times a day (BID) | ORAL | Status: DC
  Administered 2013-08-28 – 2013-08-31 (×6): 250 mg via ORAL
  Filled 2013-08-28 (×7): qty 1

## 2013-08-28 MED ORDER — NALOXONE HCL 0.4 MG/ML IJ SOLN
0.4000 mg | Freq: Once | INTRAMUSCULAR | Status: AC
  Administered 2013-08-28: 0.4 mg via INTRAVENOUS

## 2013-08-28 MED ORDER — SODIUM CHLORIDE 0.9 % IJ SOLN
3.0000 mL | Freq: Two times a day (BID) | INTRAMUSCULAR | Status: DC
  Administered 2013-08-28 – 2013-08-30 (×3): 3 mL via INTRAVENOUS

## 2013-08-28 MED ORDER — AMPHETAMINE-DEXTROAMPHETAMINE 10 MG PO TABS
30.0000 mg | ORAL_TABLET | Freq: Two times a day (BID) | ORAL | Status: DC | PRN
  Filled 2013-08-28: qty 3

## 2013-08-28 MED ORDER — NALOXONE HCL 0.4 MG/ML IJ SOLN
INTRAMUSCULAR | Status: AC
  Filled 2013-08-28: qty 1

## 2013-08-28 MED ORDER — HYDROCODONE-ACETAMINOPHEN 5-325 MG PO TABS: 1.0000 | ORAL_TABLET | ORAL | Status: DC | PRN

## 2013-08-28 MED ORDER — ENOXAPARIN SODIUM 40 MG/0.4ML ~~LOC~~ SOLN
40.0000 mg | Freq: Every day | SUBCUTANEOUS | Status: DC
  Administered 2013-08-28 – 2013-08-30 (×3): 40 mg via SUBCUTANEOUS
  Filled 2013-08-28 (×4): qty 0.4

## 2013-08-28 MED ORDER — ONDANSETRON HCL 4 MG PO TABS: 4.0000 mg | ORAL_TABLET | Freq: Four times a day (QID) | ORAL | Status: DC | PRN

## 2013-08-28 MED ORDER — ONDANSETRON HCL 4 MG/2ML IJ SOLN: 4.0000 mg | Freq: Four times a day (QID) | INTRAMUSCULAR | Status: DC | PRN

## 2013-08-28 NOTE — ED Notes (Signed)
Pt mother Orvis Brill [phone]  Father at magistrate office getting IVC papers.

## 2013-08-28 NOTE — ED Provider Notes (Signed)
Signed out with plan to reassess and place in hospital for medical clearance. Large ingestion, suicidal.   103 hydroxyzine/ methadone, pt improved with narcan.  Patient somnolent but arousable to verbal on rechecks.  Non focal neuro exam, sleeping, arousable to voice, perrl, eomfi.  Neck supple, no fever.  Labs Reviewed  COMPREHENSIVE METABOLIC PANEL - Abnormal; Notable for the following:    Albumin 3.4 (*)    AST 41 (*)    Total Bilirubin 0.2 (*)    All other components within normal limits  URINALYSIS, ROUTINE W REFLEX MICROSCOPIC - Abnormal; Notable for the following:    Color, Urine AMBER (*)    APPearance CLOUDY (*)    Specific Gravity, Urine 1.031 (*)    Hgb urine dipstick LARGE (*)    Bilirubin Urine SMALL (*)    All other components within normal limits  URINE RAPID DRUG SCREEN (HOSP PERFORMED) - Abnormal; Notable for the following:    Benzodiazepines POSITIVE (*)    All other components within normal limits  SALICYLATE LEVEL - Abnormal; Notable for the following:    Salicylate Lvl <2.0 (*)    All other components within normal limits  URINE MICROSCOPIC-ADD ON - Abnormal; Notable for the following:    Crystals CA OXALATE CRYSTALS (*)    All other components within normal limits  CBC  ACETAMINOPHEN LEVEL  GLUCOSE, CAPILLARY   Tox rec repeat ekg, reviewed, no acute findings.  Date: 08/28/2013  Rate: 75  Rhythm: normal sinus rhythm  QRS Axis: normal  Intervals: QT prolonged  ST/T Wave abnormalities: normal  Conduction Disutrbances:none  Narrative Interpretation:   Old EKG Reviewed: changes noted  Hospitalist paged for admission, pt will require sitter.   Overdose, suicidal, hematuria    Enid Skeens, MD 08/28/13 [phone]

## 2013-08-28 NOTE — ED Notes (Signed)
Pt belongings in locker #26 

## 2013-08-28 NOTE — H&P (Addendum)
[facility] History and Physical  PHEONIX CLINKSCALE HQI:[id number] DOB: 1976-11-29 DOA: 08/28/2013  Referring physician: ED physician PCP: No PCP Per Patient   Chief Complaint: overdose  HPI:  37 year old male with past medical history of ADD, kidney stone who presented to [facility] ED after an intentional overdose on hydroxyzine. Pt is unable to provide details of his medical history at this time due to poor mental status. Pt apparently had an intentional overdose with more than 100 hydroxyzine pills. In ED, vitals are stable and pt became arousable after dose of narcan. BP was 118/51, HR 70 and O2 saturation 97% on room air. Per poison control pt needs to be placed on seizure precaution and watch for rhabdomyolysis. His initial 12 lead EKG showed NSR with QT prolongation. CBC and BMP were unremarkable. CXR was WNL and CT head did not reveal acute intracranial findings.   Assessment and Plan:  Principal Problem:   Drug overdose - on vistaril; responded well to narcan given in ED - poison control contacted - pt to be observed on telemetry unit  - seizure precaution - repeat EKG in next 4 hours - sitter at bedside Active Problems:   Transaminitis - ? Alcohol abuse - will check Alcohol level in blood    Suicidal ideations - sitter at bedside - please consult psych in am   ADD (attention deficit disorder) - on adderall  - please consult psych in am and will let psych decide which meds can be resumed  Code Status: Full Family Communication: Pt at bedside Disposition Plan: Admit to telemetry bed   Review of Systems:  Unable to obtain due to altered mental status.  Past Medical History  Diagnosis Date  . Chronic back pain   . Kidney stones   . Kidney stones   . ADD (attention deficit disorder)   . Methadone use     Past Surgical History  Procedure Laterality Date  . Back surgery    . Hand surgery    . Hip surgery      Social History:  reports that he has quit smoking.  He does not have any smokeless tobacco history on file. He reports that he does not drink alcohol or use illicit drugs.  Allergies  Allergen Reactions  . Reglan [Metoclopramide] Itching  . Toradol [Ketorolac Tromethamine] Itching    Family History  Problem Relation Age of Onset  . Diabetes Other   . Stroke Other     Prior to Admission medications   Medication Sig Start Date End Date Taking? Authorizing Provider  ALPRAZolam Prudy Feeler) 1 MG tablet Take 1 mg by mouth 3 (three) times daily.    Historical Provider, MD  amphetamine-dextroamphetamine (ADDERALL) 30 MG tablet Take 30 mg by mouth 2 (two) times daily as needed (ADD).     Historical Provider, MD  carbamazepine (TEGRETOL) 200 MG tablet  08/25/13   Historical Provider, MD  clonazePAM (KLONOPIN) 0.5 MG tablet  08/26/13   Historical Provider, MD  cyclobenzaprine (FLEXERIL) 10 MG tablet Take 1 tablet (10 mg total) by mouth 2 (two) times daily as needed for muscle spasms. 08/09/13   Audree Camel, MD  divalproex (DEPAKOTE) 250 MG DR tablet  08/20/13   Historical Provider, MD  FLUoxetine (PROZAC) 40 MG capsule  08/24/13   Historical Provider, MD  hydrOXYzine (ATARAX/VISTARIL) 50 MG tablet  08/26/13   Historical Provider, MD  oxyCODONE-acetaminophen (PERCOCET) 5-325 MG per tablet Take 1 tablet by mouth every 4 (four) hours as needed  for pain. 08/09/13   Audree Camel, MD  perphenazine (TRILAFON) 2 MG tablet  08/25/13   Historical Provider, MD    Physical Exam: Filed Vitals:   08/28/13 1430 08/28/13 1442 08/28/13 1450 08/28/13 1851  BP: 118/51 133/88 134/94 136/89  Pulse: 82 82 81 70  Temp:      Resp: 11 12 11    SpO2:  100% 99% 100%    Physical Exam  Constitutional: Appears lethargic, responds by opening eyes to verbal stimuli HENT: Normocephalic. External right and left ear normal. Dry MM  Eyes: Conjunctivae and EOM are normal. Pin point pupils appreciated   Neck: Normal ROM. Neck supple. No JVD. No tracheal deviation. No  thyromegaly.  CVS: RRR, S1/S2 +, no murmurs, no gallops, no carotid bruit.  Pulmonary: Effort and breath sounds normal, no stridor, rhonchi, wheezes, rales.  Abdominal: Soft. BS +,  no distension, tenderness, rebound or guarding.  Musculoskeletal: Normal range of motion. No edema and no tenderness.  Lymphadenopathy: No lymphadenopathy noted, cervical, inguinal. Neuro: Normal reflexes, muscle tone coordination. No focal neurological findings. Skin: Skin is warm and dry. No rash noted. Not diaphoretic. No erythema. No pallor.   Labs on Admission:  Basic Metabolic Panel:  Recent Labs Lab 08/28/13 1440  NA 141  K 4.5  CL 107  CO2 24  GLUCOSE 94  BUN 11  CREATININE 0.95  CALCIUM 8.7   Liver Function Tests:  Recent Labs Lab 08/28/13 1440  AST 41*  ALT 17  ALKPHOS 89  BILITOT 0.2*  PROT 6.5  ALBUMIN 3.4*   CBC:  Recent Labs Lab 08/28/13 1440  WBC 8.9  HGB 15.2  HCT 44.7  MCV 95.7  PLT 159    CBG:  Recent Labs Lab 08/28/13 1417  GLUCAP 91    Radiological Exams on Admission: Dg Chest 2 View  08/28/2013   CLINICAL DATA:  Drug overdose  EXAM: CHEST  2 VIEW  COMPARISON:  08/09/2013  FINDINGS: The heart size and mediastinal contours are within normal limits. Both lungs are clear. Previous hardware fixation of the lumbar and thoracic spine.  IMPRESSION: No active cardiopulmonary disease.   Electronically Signed   By: Signa Kell M.D.   On: 08/28/2013 15:47   Ct Head Wo Contrast  08/28/2013   CLINICAL DATA:  Drug overdose  EXAM: CT HEAD WITHOUT CONTRAST  TECHNIQUE: Contiguous axial images were obtained from the base of the skull through the vertex without intravenous contrast.  COMPARISON:  11/16/2012  FINDINGS: The brain has a normal appearance. There is no evidence for acute infarct, mass or hemorrhage. The paranasal sinuses are clear. The mastoid air cells are clear. The skull is intact.  IMPRESSION: 1. No acute intracranial abnormalities.  Normal brain.    Electronically Signed   By: Signa Kell M.D.   On: 08/28/2013 15:18    EKG: Normal sinus rhythm, no ST/T wave changes  Debbora Presto, MD  [facility] Pager [phone]  If 7PM-7AM, please contact night-coverage [email or web address] Password [email or web address] 08/28/2013, 8:46 PM

## 2013-08-28 NOTE — ED Notes (Signed)
Per ems pt took 100 50mg  hydroxazine, pt went to court, wife took out restraining order on him, pt was upset, said he wanted to kill himself. Mom said she watched pt take the pills and called ems. When ems arrived 5 minutes after medication ingestion initially alert and oriented x4, ambulatory to ems truck, aggressive towards ems, during transport pt LOC declined to where pt only responded to painful stimuli. Initially BP 140/100, dropped to 90/70. 97% on 2 L . Pupils 5, not pinpoint. Pt takes methadone every day. Pt has long hx of being violent.   20g IV L AC, NS bolus.   Upon arrival to ED pt altered, responding to painful stimuli, pupils pinpoint. PA at bedside, 0.4mg  narcan given. Pt became more alert and responding to verbal stimuli. Pt attempting to urinate in urinal.

## 2013-08-28 NOTE — ED Notes (Signed)
Patient has one bag of belongings in locker 26. 

## 2013-08-28 NOTE — ED Provider Notes (Signed)
CSN: [id number]     Arrival date & time 08/28/13  1408 History   First MD Initiated Contact with Patient 08/28/13 1410     Chief Complaint  Patient presents with  . Drug Overdose   (Consider location/radiation/quality/duration/timing/severity/associated sxs/prior Treatment) HPI  Jonathan Perez is a 37 year old male with a past medical history of urinary tract infection, chronic pain, attention deficit disorder who presents the emergency department after overdose on Vistaril.  History is gathered by emergency medical services and enforcement who are present at the scene.  Patient apparently was brought to court by his estranged wife and serve to restraining order.  The patient was upset by this and began taking Vistaril.  And had his medication refilled yesterday.  He was given 120 Vistaril.  He has only 17 left today.  According to law enforcement and EMS the patient also has prescription for methadone at his house.  Patient states that he didn't want to be here anymore and that when he took his medication.   Past Medical History  Diagnosis Date  . Chronic back pain   . Kidney stones   . Kidney stones   . ADD (attention deficit disorder)    Past Surgical History  Procedure Laterality Date  . Back surgery    . Hand surgery    . Hip surgery     Family History  Problem Relation Age of Onset  . Diabetes Other   . Stroke Other    History  Substance Use Topics  . Smoking status: Former Smoker -- 0.50 packs/day  . Smokeless tobacco: Not on file  . Alcohol Use: No    Review of Systems  Unable to perform ROS: Mental status change    Allergies  Reglan and Toradol  Home Medications   Current Outpatient Rx  Name  Route  Sig  Dispense  Refill  . ALPRAZolam (XANAX) 1 MG tablet   Oral   Take 1 mg by mouth 3 (three) times daily.         Marland Kitchen amphetamine-dextroamphetamine (ADDERALL) 30 MG tablet   Oral   Take 30 mg by mouth 2 (two) times daily as needed (ADD).          Marland Kitchen  cyclobenzaprine (FLEXERIL) 10 MG tablet   Oral   Take 1 tablet (10 mg total) by mouth 2 (two) times daily as needed for muscle spasms.   20 tablet   0   . oxyCODONE-acetaminophen (PERCOCET) 5-325 MG per tablet   Oral   Take 1 tablet by mouth every 4 (four) hours as needed for pain.   20 tablet   0    There were no vitals taken for this visit. Physical Exam  Nursing note and vitals reviewed. Constitutional: He appears well-developed and well-nourished.  HENT:  Head: Normocephalic and atraumatic.  Eyes:  Pin point pupils.  Neck: Normal range of motion. Neck supple.  Pulmonary/Chest: He has no wheezes.  intermittent apnea   Abdominal: Soft. He exhibits no distension. There is no tenderness.  Neurological: GCS eye subscore is 3. GCS verbal subscore is 5. GCS motor subscore is 6.  Patient with slurred speech, he is somnolent, however he responds to speech and pain and is orieneted to person,place, time and event.    ED Course  Procedures (including critical care time) Labs Review Labs Reviewed  GLUCOSE, CAPILLARY  CBC  COMPREHENSIVE METABOLIC PANEL  URINALYSIS, ROUTINE W REFLEX MICROSCOPIC  URINE RAPID DRUG SCREEN (HOSP PERFORMED)  SALICYLATE LEVEL  ACETAMINOPHEN LEVEL  Imaging Review No results found.   Date: 08/28/2013  Rate: 91  Rhythm: normal sinus rhythm  QRS Axis: normal  Intervals: QT prolonged  ST/T Wave abnormalities: normal  Conduction Disutrbances:none  Narrative Interpretation:   Old EKG Reviewed: changes noted improcved form Jul 11 2012    MDM  No diagnosis found. Patient here with acute intentional ingestion.  I have spoken with [facility] about his overdose.  Risks of overdose include agitation, delirium and acute psychosis, seizures and hypotension.  Patient is to be placed on seizure precautions.  Benzodiazepines may be used to treat seizure.  He must be assessed for rhabdo if seizure occurs.  Patient is also at risk for QRS  widening and should have a repeat EKG in 4 hours from initial visit and EKG monitor vitals and cardiac monitoring including his temperature.  Poison control will call back for evaluation of his lab results.Marland Kitchen   4:43 PM BP 134/94  Pulse 81  Temp(Src) 98.3 F (36.8 C)  Resp 11  SpO2 99% elvated AST. Positive for benzodiaepines Urine with crystals and hgb, no signs of infection however,. Patient is stable. Vitals signs are wnl. He is protecting his airway. Imaging is negative. I have given report to Dr. Jodi Mourning who wil lassume care of the patient. Patient needs observation for at least 6 hours. He is under IVC.      Arthor Captain, PA-C 08/28/13 1649

## 2013-08-28 NOTE — ED Notes (Signed)
Report given to laura,rn

## 2013-08-28 NOTE — ED Notes (Signed)
Pt belongings removed from locker 26, sent up with pt and given to Lindsi, Charity fundraiser.

## 2013-08-28 NOTE — ED Notes (Signed)
Bed: WA01 Expected date:  Expected time:  Means of arrival:  Comments: EMS 

## 2013-08-29 DIAGNOSIS — F314 Bipolar disorder, current episode depressed, severe, without psychotic features: Secondary | ICD-10-CM

## 2013-08-29 DIAGNOSIS — T50902A Poisoning by unspecified drugs, medicaments and biological substances, intentional self-harm, initial encounter: Secondary | ICD-10-CM

## 2013-08-29 LAB — BASIC METABOLIC PANEL
CO2: 26 mEq/L (ref 19–32)
Calcium: 8.3 mg/dL — ABNORMAL LOW (ref 8.4–10.5)
Chloride: 110 mEq/L (ref 96–112)
Creatinine, Ser: 0.96 mg/dL (ref 0.50–1.35)
GFR calc Af Amer: >90 mL/min (ref >90)
Glucose, Bld: 78 mg/dL (ref 70–99)
Sodium: 144 mEq/L (ref 135–145)

## 2013-08-29 LAB — CBC
HCT: 41.9 % (ref 39.0–52.0)
Hemoglobin: 13.8 g/dL (ref 13.0–17.0)
MCH: 31.9 pg (ref 26.0–34.0)
MCV: 97 fL (ref 78.0–100.0)
Platelets: 208 10*3/uL (ref 150–400)
RBC: 4.32 MIL/uL (ref 4.22–5.81)

## 2013-08-29 LAB — ETHANOL: Alcohol, Ethyl (B): <11 mg/dL (ref 0–11)

## 2013-08-29 LAB — TROPONIN I
Troponin I: <0.3 ng/mL (ref <0.30)
Troponin I: <0.3 ng/mL (ref <0.30)
Troponin I: <0.3 ng/mL (ref <0.30)

## 2013-08-29 MED ORDER — HYDROCODONE-ACETAMINOPHEN 5-325 MG PO TABS: 1.0000 | ORAL_TABLET | ORAL | Status: DC | PRN

## 2013-08-29 MED ORDER — INFLUENZA VAC SPLIT QUAD 0.5 ML IM SUSP
0.5000 mL | Freq: Once | INTRAMUSCULAR | Status: DC
  Filled 2013-08-29 (×5): qty 0.5

## 2013-08-29 MED ORDER — METHADONE HCL 10 MG PO TABS
40.0000 mg | ORAL_TABLET | Freq: Every day | ORAL | Status: DC
  Administered 2013-08-30 – 2013-08-31 (×2): 40 mg via ORAL
  Filled 2013-08-29 (×2): qty 4

## 2013-08-29 MED ORDER — OXYCODONE HCL 5 MG PO TABS
10.0000 mg | ORAL_TABLET | Freq: Four times a day (QID) | ORAL | Status: DC | PRN
  Administered 2013-08-29 (×2): 10 mg via ORAL
  Filled 2013-08-29 (×2): qty 2

## 2013-08-29 MED ORDER — OXYCODONE HCL 5 MG PO TABS
10.0000 mg | ORAL_TABLET | ORAL | Status: AC | PRN
  Administered 2013-08-29 (×2): 10 mg via ORAL
  Administered 2013-08-30 (×3): 20 mg via ORAL
  Filled 2013-08-29 (×4): qty 4

## 2013-08-29 NOTE — Progress Notes (Signed)
Patient has removed telemetry leads and is refusing to wear them. Patient is also threatening to leave. Dr. Donna Bernard notified and spoke with patient. Patient has been told that if he attempts to leave that security will be called as he is a danger to himself. Patient verbalized with nurse and charge nurse in the room that he wants to go home and that he will succeed this time. When told that he is a danger to himself and cannot go home right now he then tried to refute his previous statement and say that he was trying to say he would succeed in mending things with his wife.sitter at bedside. Will continue to monitor closely.  Erskin Burnet RN

## 2013-08-29 NOTE — Progress Notes (Signed)
Patient is still upset and agitated. Still very verbally aggressive with staff. He also said that when he gets out of the hospital he will do "things on a grander scale". He also expressed anger with his family. He does not want staff to speak with his mother. His mother called and we told her that we cannot speak to her due to HIPPA. Erskin Burnet RN

## 2013-08-29 NOTE — BH Assessment (Signed)
Patient accepted by MD Gilmore Laroche to [facility] 400 hall when bed available. Marchelle Folks RN notified.

## 2013-08-29 NOTE — Progress Notes (Addendum)
Per MD, Pt ready for d/c.  Notified [facility].  Referral made to [facility].  Providence Crosby, LCSWA Clinical Social Work [phone]

## 2013-08-29 NOTE — Progress Notes (Signed)
[facility] PROGRESS NOTE  Jonathan Perez ZOX:[id number] DOB: 07/27/1976 DOA: 08/28/2013 PCP: No PCP Per Patient  Assessment/Plan: Principal Problem:  Drug overdose  - on vistaril; responded well to narcan given in ED  - poison control contacted  - await repeat EKG this am  -Continue sitter at bedside for suicide precautions  -I. have consulted psychiatry -With nursing staff present I have advised patient that he needs to be evaluated by psychiatry and attempts to leave prior to that will results in involuntary commitment -Pt is medically clear for inpatient psych Active Problems:  Transaminitis  - posssibly secondary to meds, Alcohol level in blood is less than 11 -follow recheck Suicide attempt - contiue sitter at bedside  - I have consult psych for further recs  ADD (attention deficit disorder)  - continue adderall  -psych consulted for further recs     Code Status: full Family Communication: none at bedside Disposition Plan: pending psych eval-medically clear   Consultants:  Psychiatry-eval pending  Procedures:  none  Antibiotics:  none  HPI/Subjective: Pt somnolent but easily aroused, he admits to suicide attempt with drug overdose. He is complaining of back pain stating that he was on oxycodone every 4 outpatient. He is irate about being in the hospital stating was why he was trying to kill himself and he wants to leave  Objective: Filed Vitals:   08/29/13 0535  BP: 126/78  Pulse: 69  Temp: 98.2 F (36.8 C)  Resp: 14    Intake/Output Summary (Last 24 hours) at 08/29/13 1036 Last data filed at 08/29/13 8119  Gross per 24 hour  Intake 340.83 ml  Output      0 ml  Net 340.83 ml   Filed Weights   08/28/13 2147  Weight: 101.3 kg (223 lb 5.2 oz)    Exam:  General: Somnolent but arousable to loud voice and deep touch & oriented x 3  In NAD Cardiovascular: RRR, nl S1 s2 Respiratory: CTAB Abdomen: soft +BS NT/ND, no masses  palpable Extremities: No cyanosis and no edema     Data Reviewed: Basic Metabolic Panel:  Recent Labs Lab 08/28/13 1440 08/28/13 2340 08/29/13 0324  NA 141  --  144  K 4.5  --  3.8  CL 107  --  110  CO2 24  --  26  GLUCOSE 94  --  78  BUN 11  --  10  CREATININE 0.95  --  0.96  CALCIUM 8.7  --  8.3*  MG  --  1.8  --   PHOS  --  3.8  --    Liver Function Tests:  Recent Labs Lab 08/28/13 1440  AST 41*  ALT 17  ALKPHOS 89  BILITOT 0.2*  PROT 6.5  ALBUMIN 3.4*   No results found for this basename: LIPASE, AMYLASE,  in the last 168 hours No results found for this basename: AMMONIA,  in the last 168 hours CBC:  Recent Labs Lab 08/28/13 1440 08/29/13 0324  WBC 8.9 9.9  HGB 15.2 13.8  HCT 44.7 41.9  MCV 95.7 97.0  PLT 159 208   Cardiac Enzymes:  Recent Labs Lab 08/28/13 2340 08/29/13 0324 08/29/13 0918  TROPONINI <0.30 <0.30 <0.30   BNP (last 3 results) No results found for this basename: PROBNP,  in the last 8760 hours CBG:  Recent Labs Lab 08/28/13 1417  GLUCAP 91    No results found for this or any previous visit (from the past 240 hour(s)).   Studies:  Dg Chest 2 View  08/28/2013   CLINICAL DATA:  Drug overdose  EXAM: CHEST  2 VIEW  COMPARISON:  08/09/2013  FINDINGS: The heart size and mediastinal contours are within normal limits. Both lungs are clear. Previous hardware fixation of the lumbar and thoracic spine.  IMPRESSION: No active cardiopulmonary disease.   Electronically Signed   By: Signa Kell M.D.   On: 08/28/2013 15:47   Ct Head Wo Contrast  08/28/2013   CLINICAL DATA:  Drug overdose  EXAM: CT HEAD WITHOUT CONTRAST  TECHNIQUE: Contiguous axial images were obtained from the base of the skull through the vertex without intravenous contrast.  COMPARISON:  11/16/2012  FINDINGS: The brain has a normal appearance. There is no evidence for acute infarct, mass or hemorrhage. The paranasal sinuses are clear. The mastoid air cells are clear.  The skull is intact.  IMPRESSION: 1. No acute intracranial abnormalities.  Normal brain.   Electronically Signed   By: Signa Kell M.D.   On: 08/28/2013 15:18    Scheduled Meds: . carbamazepine  200 mg Oral TID  . divalproex  250 mg Oral Q12H  . enoxaparin (LOVENOX) injection  40 mg Subcutaneous QHS  . influenza vac split quadrivalent PF  0.5 mL Intramuscular Once  . sodium chloride  3 mL Intravenous Q12H   Continuous Infusions: . sodium chloride 50 mL/hr at 08/29/13 0600    Principal Problem:   Drug overdose Active Problems:   ADD (attention deficit disorder)    Time spent: 35    [facility]  [facility] Pager [phone]. If 7PM-7AM, please contact night-coverage at [email or web address], password [facility] 08/29/2013, 10:36 AM  LOS: 1 day

## 2013-08-29 NOTE — Discharge Summary (Addendum)
Physician Discharge Summary  Jonathan Perez:[id number] DOB: 22-Dec-1975 DOA: 08/28/2013  PCP: No PCP Per Patient  Admit date: 08/28/2013 Discharge date: 08/29/2013  Time spent: <30 minutes  Recommendations for Outpatient Follow-up:  1. Patient has been transferred to inpatient psychiatry hospital  Discharge Diagnoses:  Principal Problem:   Drug overdose Active Problems:   ADD (attention deficit disorder)   Bipolar I disorder, most recent episode (or current) depressed, severe, without mention of psychotic behavior   Discharge Condition: Stable  Diet recommendation: regular  Filed Weights   08/28/13 2147  Weight: 101.3 kg (223 lb 5.2 oz)    History of present illness:  37 year old male with past medical history of ADD, kidney stone who presented to [facility] ED after an intentional overdose on hydroxyzine. Pt is unable to provide details of his medical history at this time due to poor mental status. Pt apparently had an intentional overdose with more than 100 hydroxyzine pills.  In ED, vitals are stable and pt became arousable after dose of narcan. BP was 118/51, HR 70 and O2 saturation 97% on room air. Per poison control pt needs to be placed on seizure precaution and watch for rhabdomyolysis. His initial 12 lead EKG showed NSR with QT prolongation. CBC and BMP were unremarkable. CXR was WNL and CT head did not reveal acute intracranial findings.    Hospital Course:  Principal Problem:  Drug overdose  - As discussed above, patient admitted with intentional overdose on vistaril; responded well to narcan given in ED  - poison control contacted upon admission -He had suicide precautions with one-on-one sitter while in the hospital -He removed his telemetry box while in the hospital and refused to have a followup repeat EKG done. -Patient has remained hemodynamically stable he is now alert  and conversant and medically stable for inpatient hospitalization -I consulted psychiatry and  patient was seen in inpatient psych care recommended. The psychiatrist also recommended to resume all his outpatient medications except for the hydroxyzine and this was done. -Pt is medically clear for inpatient psych as above, and social work assisted with placement>> and he'll be transferred to [facility] for further inpatient care Active Problems:  Transaminitis  - very mild AST elevation posssibly secondary to meds, Alcohol level in blood is less than 11  Suicide attempt  -See #1 above, patient was seen by psychiatry in inpatient psych hospital recommended. He has been placed at [facility] with social work assistance ADD (attention deficit disorder)  - continue adderall  -psych consulted for further recs   Procedures:  None  Consultations:  Psychiatry  Discharge Exam: Filed Vitals:   08/29/13 0535  BP: 126/78  Pulse: 69  Temp: 98.2 F (36.8 C)  Resp: 14   Exam:  General: Patient now alert and  oriented x 3 In NAD  Cardiovascular: RRR, nl S1 s2  Respiratory: CTAB  Abdomen: soft +BS NT/ND, no masses palpable  Extremities: No cyanosis and no edema   Discharge Instructions  Discharge Orders   Future Orders Complete By Expires   Diet general  As directed    Increase activity slowly  As directed        Medication List    STOP taking these medications       hydrOXYzine 50 MG tablet  Commonly known as:  ATARAX/VISTARIL      TAKE these medications       ALPRAZolam 1 MG tablet  Commonly known as:  XANAX  Take  1 mg by mouth 3 (three) times daily.     amphetamine-dextroamphetamine 30 MG tablet  Commonly known as:  ADDERALL  Take 30 mg by mouth 2 (two) times daily as needed (ADD).     carbamazepine 200 MG tablet  Commonly known as:  TEGRETOL     clonazePAM 0.5 MG tablet  Commonly known as:  KLONOPIN     cyclobenzaprine 10 MG tablet  Commonly known as:  FLEXERIL  Take 1 tablet (10 mg total) by mouth 2 (two) times daily as needed for  muscle spasms.     divalproex 250 MG DR tablet  Commonly known as:  DEPAKOTE     FLUoxetine 40 MG capsule  Commonly known as:  PROZAC     METHADONE HCL PO  Take by mouth.     oxyCODONE-acetaminophen 5-325 MG per tablet  Commonly known as:  PERCOCET  Take 1 tablet by mouth every 4 (four) hours as needed for pain.     perphenazine 2 MG tablet  Commonly known as:  TRILAFON       Allergies  Allergen Reactions  . Reglan [Metoclopramide] Itching  . Toradol [Ketorolac Tromethamine] Itching      The results of significant diagnostics from this hospitalization (including imaging, microbiology, ancillary and laboratory) are listed below for reference.    Significant Diagnostic Studies: Dg Chest 2 View  08/28/2013   CLINICAL DATA:  Drug overdose  EXAM: CHEST  2 VIEW  COMPARISON:  08/09/2013  FINDINGS: The heart size and mediastinal contours are within normal limits. Both lungs are clear. Previous hardware fixation of the lumbar and thoracic spine.  IMPRESSION: No active cardiopulmonary disease.   Electronically Signed   By: Signa Kell M.D.   On: 08/28/2013 15:47   Dg Chest 2 View  08/09/2013   *RADIOLOGY REPORT*  Clinical Data: Fall, back pain  CHEST - 2 VIEW  Comparison: 11/16/2012  Findings: Normal heart size and vascularity.  Thoracic lumbar fusion hardware noted.  Lungs clear.  No effusion or pneumothorax. No osseous abnormality.  IMPRESSION: No acute chest process.   Original Report Authenticated By: Judie Petit. Miles Costain, M.D.   Dg Thoracic Spine 2 View  08/09/2013   *RADIOLOGY REPORT*  Clinical Data: Fall, back pain, history of thoracic lumbar fusion and T12 corpectomy.  THORACIC SPINE - 2 VIEW  Comparison: 08/09/2013, 03/15/2013 CT reconstructions.  Findings: Thoracic lumbar fusion hardware noted and a prior T12 corpectomy.  Stable alignment.  No new compression fracture or definite hardware abnormality demonstrated.  No paraspinous soft tissue abnormality.  Lungs clear.  IMPRESSION: Stable  postoperative findings and alignment.   Original Report Authenticated By: Judie Petit. Miles Costain, M.D.   Ct Head Wo Contrast  08/28/2013   CLINICAL DATA:  Drug overdose  EXAM: CT HEAD WITHOUT CONTRAST  TECHNIQUE: Contiguous axial images were obtained from the base of the skull through the vertex without intravenous contrast.  COMPARISON:  11/16/2012  FINDINGS: The brain has a normal appearance. There is no evidence for acute infarct, mass or hemorrhage. The paranasal sinuses are clear. The mastoid air cells are clear. The skull is intact.  IMPRESSION: 1. No acute intracranial abnormalities.  Normal brain.   Electronically Signed   By: Signa Kell M.D.   On: 08/28/2013 15:18    Microbiology: No results found for this or any previous visit (from the past 240 hour(s)).   Labs: Basic Metabolic Panel:  Recent Labs Lab 08/28/13 1440 08/28/13 2340 08/29/13 0324  NA 141  --  144  K 4.5  --  3.8  CL 107  --  110  CO2 24  --  26  GLUCOSE 94  --  78  BUN 11  --  10  CREATININE 0.95  --  0.96  CALCIUM 8.7  --  8.3*  MG  --  1.8  --   PHOS  --  3.8  --    Liver Function Tests:  Recent Labs Lab 08/28/13 1440  AST 41*  ALT 17  ALKPHOS 89  BILITOT 0.2*  PROT 6.5  ALBUMIN 3.4*   No results found for this basename: LIPASE, AMYLASE,  in the last 168 hours No results found for this basename: AMMONIA,  in the last 168 hours CBC:  Recent Labs Lab 08/28/13 1440 08/29/13 0324  WBC 8.9 9.9  HGB 15.2 13.8  HCT 44.7 41.9  MCV 95.7 97.0  PLT 159 208   Cardiac Enzymes:  Recent Labs Lab 08/28/13 2340 08/29/13 0324 08/29/13 0918  TROPONINI <0.30 <0.30 <0.30   BNP: BNP (last 3 results) No results found for this basename: PROBNP,  in the last 8760 hours CBG:  Recent Labs Lab 08/28/13 1417  GLUCAP 91       Signed:  Graesyn Schreifels C  [facility] 08/29/2013, 2:10 PM

## 2013-08-29 NOTE — Progress Notes (Addendum)
Notified that patient tried to elope from the hospital. Patient disconnected IV.  Left the room. Suicide sitter followed patient as he got on the elevator and went to the basement where he "wandered around" and then her got back on the elevator and came back up to the floor. Security was notified. Sitter remains at bedside Dr. Donna Bernard notified. Erskin Burnet RN

## 2013-08-29 NOTE — Progress Notes (Signed)
Confirmed receipt of referral by [facility] assessment office.  Informed that they have 1 male bed available and that they will notify CSW if Pt is appropriate.  Providence Crosby, LCSWA Clinical Social Work [phone]

## 2013-08-29 NOTE — Consult Note (Signed)
[facility] Face-to-Face Psychiatry Consult   Reason for Consult:  overdose Referring Physician:   Attending Jonathan Perez is an 37 y.o. male.  Assessment: AXIS I:  Bipolar, Depressed AXIS II:  Deferred AXIS III:   Past Medical History  Diagnosis Date  . Chronic back pain   . Kidney stones   . Kidney stones   . ADD (attention deficit disorder)   . Methadone use    AXIS IV:  other psychosocial or environmental problems and problems related to social environment AXIS V:  11-20 some danger of hurting self or others possible OR occasionally fails to maintain minimal personal hygiene OR gross impairment in communication  Plan:  Recommend psychiatric Inpatient admission when medically cleared.  Subjective:   Jonathan Perez is a 37 y.o. male patient admitted with overdose on hydroxyzine near 100 tablets.Jonathan Perez  HPI:  Married white male in bed. He was on phone trying to converse with someone then dazed off. He mentioned heard some news about his wife that upsetted him and he took overdose. Somewhat agitated and did not want to talk specifics. Endorses depressed mood. Says he follows with Dr. Otelia Perez as outpatient and saw him recently. Has been taking his meds but got impulsive with overdose. I asked to permit to talk to wife. He called no body answered and said not to call her unless he is in the conversation. Denies psychotic symptoms , remained guarded and resistent to details of symptoms and stress. HPI Elements:   Location:  hosptital. Quality:  in bed. Duration:  recently overdosed.  Past Psychiatric History: Past Medical History  Diagnosis Date  . Chronic back pain   . Kidney stones   . Kidney stones   . ADD (attention deficit disorder)   . Methadone use     reports that he has quit smoking. He does not have any smokeless tobacco history on file. He reports that he does not drink alcohol or use illicit drugs. Family History  Problem Relation Age of Onset  . Diabetes Other   . Stroke  Other      Living Arrangements: Parent   Abuse/Neglect [facility]) Physical Abuse: Denies Verbal Abuse: Denies Sexual Abuse: Denies Allergies:   Allergies  Allergen Reactions  . Reglan [Metoclopramide] Itching  . Toradol [Ketorolac Tromethamine] Itching    ACT Assessment Complete:  No:   Past Psychiatric History: Diagnosis:  Bipolar  Dr. Otelia Perez    Substance Abuse Care:  denies  Self-Mutilation:  overdosed  Suicidal Attempts:  yes  Homicidal Behaviors:  denies   Violent Behaviors:  questionalbe   Place of Residence:  home Marital Status:  Yes  Employed/Unemployed:  disabled Education:  See chart Family Supports:  Not known Objective: Blood pressure 126/78, pulse 69, temperature 98.2 F (36.8 C), temperature source Oral, resp. rate 14, height 5\' 8"  (1.727 m), weight 101.3 kg (223 lb 5.2 oz), SpO2 96.00%.Body mass index is 33.96 kg/(m^2). Results for orders placed during the hospital encounter of 08/28/13 (from the past 72 hour(s))  GLUCOSE, CAPILLARY     Status: None   Collection Time    08/28/13  2:17 PM      Result Value Range   Glucose-Capillary 91  70 - 99 mg/dL   Comment 1 Documented in Chart     Comment 2 Notify RN    URINALYSIS, ROUTINE W REFLEX MICROSCOPIC     Status: Abnormal   Collection Time    08/28/13  2:20 PM      Result Value  Range   Color, Urine AMBER (*) YELLOW   Comment: BIOCHEMICALS MAY BE AFFECTED BY COLOR   APPearance CLOUDY (*) CLEAR   Specific Gravity, Urine 1.031 (*) 1.005 - 1.030   pH 6.0  5.0 - 8.0   Glucose, UA NEGATIVE  NEGATIVE mg/dL   Hgb urine dipstick LARGE (*) NEGATIVE   Bilirubin Urine SMALL (*) NEGATIVE   Ketones, ur NEGATIVE  NEGATIVE mg/dL   Protein, ur NEGATIVE  NEGATIVE mg/dL   Urobilinogen, UA 1.0  0.0 - 1.0 mg/dL   Nitrite NEGATIVE  NEGATIVE   Leukocytes, UA NEGATIVE  NEGATIVE  URINE RAPID DRUG SCREEN (HOSP PERFORMED)     Status: Abnormal   Collection Time    08/28/13  2:20 PM      Result Value Range   Opiates NONE  DETECTED  NONE DETECTED   Cocaine NONE DETECTED  NONE DETECTED   Benzodiazepines POSITIVE (*) NONE DETECTED   Amphetamines NONE DETECTED  NONE DETECTED   Tetrahydrocannabinol NONE DETECTED  NONE DETECTED   Barbiturates NONE DETECTED  NONE DETECTED   Comment:            DRUG SCREEN FOR MEDICAL PURPOSES     ONLY.  IF CONFIRMATION IS NEEDED     FOR ANY PURPOSE, NOTIFY LAB     WITHIN 5 DAYS.                LOWEST DETECTABLE LIMITS     FOR URINE DRUG SCREEN     Drug Class       Cutoff (ng/mL)     Amphetamine      1000     Barbiturate      200     Benzodiazepine   200     Tricyclics       300     Opiates          300     Cocaine          300     THC              50  URINE MICROSCOPIC-ADD ON     Status: Abnormal   Collection Time    08/28/13  2:20 PM      Result Value Range   WBC, UA 0-2  <3 WBC/hpf   RBC / HPF TOO NUMEROUS TO COUNT  <3 RBC/hpf   Crystals CA OXALATE CRYSTALS (*) NEGATIVE  CBC     Status: None   Collection Time    08/28/13  2:40 PM      Result Value Range   WBC 8.9  4.0 - 10.5 K/uL   RBC 4.67  4.22 - 5.81 MIL/uL   Hemoglobin 15.2  13.0 - 17.0 g/dL   HCT 09.8  11.9 - 14.7 %   MCV 95.7  78.0 - 100.0 fL   MCH 32.5  26.0 - 34.0 pg   MCHC 34.0  30.0 - 36.0 g/dL   RDW 82.9  56.2 - 13.0 %   Platelets 159  150 - 400 K/uL  COMPREHENSIVE METABOLIC PANEL     Status: Abnormal   Collection Time    08/28/13  2:40 PM      Result Value Range   Sodium 141  135 - 145 mEq/L   Potassium 4.5  3.5 - 5.1 mEq/L   Comment: MODERATE HEMOLYSIS     HEMOLYSIS AT THIS LEVEL MAY AFFECT RESULT   Chloride 107  96 - 112 mEq/L   CO2 24  19 - 32 mEq/L   Glucose, Bld 94  70 - 99 mg/dL   BUN 11  6 - 23 mg/dL   Creatinine, Ser 1.61  0.50 - 1.35 mg/dL   Calcium 8.7  8.4 - 09.6 mg/dL   Total Protein 6.5  6.0 - 8.3 g/dL   Albumin 3.4 (*) 3.5 - 5.2 g/dL   AST 41 (*) 0 - 37 U/L   ALT 17  0 - 53 U/L   Comment: MODERATE HEMOLYSIS     HEMOLYSIS AT THIS LEVEL MAY AFFECT RESULT   Alkaline  Phosphatase 89  39 - 117 U/L   Comment: MODERATE HEMOLYSIS     HEMOLYSIS AT THIS LEVEL MAY AFFECT RESULT   Total Bilirubin 0.2 (*) 0.3 - 1.2 mg/dL   GFR calc non Af Amer >90  >90 mL/min   GFR calc Af Amer >90  >90 mL/min   Comment: (NOTE)     The eGFR has been calculated using the CKD EPI equation.     This calculation has not been validated in all clinical situations.     eGFR's persistently <90 mL/min signify possible Chronic Kidney     Disease.  SALICYLATE LEVEL     Status: Abnormal   Collection Time    08/28/13  2:40 PM      Result Value Range   Salicylate Lvl <2.0 (*) 2.8 - 20.0 mg/dL  ACETAMINOPHEN LEVEL     Status: None   Collection Time    08/28/13  2:40 PM      Result Value Range   Acetaminophen (Tylenol), Serum <15.0  10 - 30 ug/mL   Comment:            THERAPEUTIC CONCENTRATIONS VARY     SIGNIFICANTLY. A RANGE OF 10-30     ug/mL MAY BE AN EFFECTIVE     CONCENTRATION FOR MANY PATIENTS.     HOWEVER, SOME ARE BEST TREATED     AT CONCENTRATIONS OUTSIDE THIS     RANGE.     ACETAMINOPHEN CONCENTRATIONS     >150 ug/mL AT 4 HOURS AFTER     INGESTION AND >50 ug/mL AT 12     HOURS AFTER INGESTION ARE     OFTEN ASSOCIATED WITH TOXIC     REACTIONS.  MAGNESIUM     Status: None   Collection Time    08/28/13 11:40 PM      Result Value Range   Magnesium 1.8  1.5 - 2.5 mg/dL  PHOSPHORUS     Status: None   Collection Time    08/28/13 11:40 PM      Result Value Range   Phosphorus 3.8  2.3 - 4.6 mg/dL  TSH     Status: None   Collection Time    08/28/13 11:40 PM      Result Value Range   TSH 1.158  0.350 - 4.500 uIU/mL   Comment: Performed at [facility]  TROPONIN I     Status: None   Collection Time    08/28/13 11:40 PM      Result Value Range   Troponin I <0.30  <0.30 ng/mL   Comment:            Due to the release kinetics of cTnI,     a negative result within the first hours     of the onset of symptoms does not rule out     myocardial infarction with  certainty.     If myocardial infarction  is still suspected,     repeat the test at appropriate intervals.  ETHANOL     Status: None   Collection Time    08/28/13 11:40 PM      Result Value Range   Alcohol, Ethyl (B) <11  0 - 11 mg/dL   Comment:            LOWEST DETECTABLE LIMIT FOR     SERUM ALCOHOL IS 11 mg/dL     FOR MEDICAL PURPOSES ONLY  BASIC METABOLIC PANEL     Status: Abnormal   Collection Time    08/29/13  3:24 AM      Result Value Range   Sodium 144  135 - 145 mEq/L   Potassium 3.8  3.5 - 5.1 mEq/L   Chloride 110  96 - 112 mEq/L   CO2 26  19 - 32 mEq/L   Glucose, Bld 78  70 - 99 mg/dL   BUN 10  6 - 23 mg/dL   Creatinine, Ser 1.61  0.50 - 1.35 mg/dL   Calcium 8.3 (*) 8.4 - 10.5 mg/dL   GFR calc non Af Amer >90  >90 mL/min   GFR calc Af Amer >90  >90 mL/min   Comment: (NOTE)     The eGFR has been calculated using the CKD EPI equation.     This calculation has not been validated in all clinical situations.     eGFR's persistently <90 mL/min signify possible Chronic Kidney     Disease.  CBC     Status: None   Collection Time    08/29/13  3:24 AM      Result Value Range   WBC 9.9  4.0 - 10.5 K/uL   RBC 4.32  4.22 - 5.81 MIL/uL   Hemoglobin 13.8  13.0 - 17.0 g/dL   HCT 09.6  04.5 - 40.9 %   MCV 97.0  78.0 - 100.0 fL   MCH 31.9  26.0 - 34.0 pg   MCHC 32.9  30.0 - 36.0 g/dL   RDW 81.1  91.4 - 78.2 %   Platelets 208  150 - 400 K/uL   Comment: DELTA CHECK NOTED     REPEATED TO VERIFY  TROPONIN I     Status: None   Collection Time    08/29/13  3:24 AM      Result Value Range   Troponin I <0.30  <0.30 ng/mL   Comment:            Due to the release kinetics of cTnI,     a negative result within the first hours     of the onset of symptoms does not rule out     myocardial infarction with certainty.     If myocardial infarction is still suspected,     repeat the test at appropriate intervals.  TROPONIN I     Status: None   Collection Time    08/29/13  9:18 AM       Result Value Range   Troponin I <0.30  <0.30 ng/mL   Comment:            Due to the release kinetics of cTnI,     a negative result within the first hours     of the onset of symptoms does not rule out     myocardial infarction with certainty.     If myocardial infarction is still suspected,     repeat the test at appropriate intervals.  Labs are reviewed and are pertinent for benzodiazepines. See medical notes.  Current Facility-Administered Medications  Medication Dose Route Frequency Provider Last Rate Last Dose  . 0.9 %  sodium chloride infusion   Intravenous Continuous Dorothea Ogle, MD 50 mL/hr at 08/29/13 0600    . amphetamine-dextroamphetamine (ADDERALL) tablet 30 mg  30 mg Oral BID PRN Dorothea Ogle, MD      . carbamazepine (TEGRETOL) tablet 200 mg  200 mg Oral TID Dorothea Ogle, MD   200 mg at 08/28/13 2305  . divalproex (DEPAKOTE) DR tablet 250 mg  250 mg Oral Q12H Dorothea Ogle, MD   250 mg at 08/29/13 1058  . enoxaparin (LOVENOX) injection 40 mg  40 mg Subcutaneous QHS Dorothea Ogle, MD   40 mg at 08/28/13 2304  . influenza vac split quadrivalent PF (FLUARIX) injection 0.5 mL  0.5 mL Intramuscular Once Dorothea Ogle, MD      . ondansetron [facility]) tablet 4 mg  4 mg Oral Q6H PRN Dorothea Ogle, MD       Or  . ondansetron [facility]) injection 4 mg  4 mg Intravenous Q6H PRN Dorothea Ogle, MD      . oxyCODONE (Oxy IR/ROXICODONE) immediate release tablet 10-20 mg  10-20 mg Oral Q6H PRN Kela Millin, MD   10 mg at 08/29/13 1100  . sodium chloride 0.9 % injection 3 mL  3 mL Intravenous Q12H Dorothea Ogle, MD   3 mL at 08/28/13 2311    Psychiatric Specialty Exam:     Blood pressure 126/78, pulse 69, temperature 98.2 F (36.8 C), temperature source Oral, resp. rate 14, height 5\' 8"  (1.727 m), weight 101.3 kg (223 lb 5.2 oz), SpO2 96.00%.Body mass index is 33.96 kg/(m^2).  General Appearance: Guarded  Eye Contact::  Minimal  Speech:  Slow  Volume:  Decreased  Mood:  Angry,  Dysphoric and Irritable  Affect:  Congruent  Thought Process:  Linear  Orientation:  Other:  somewhat confused about the date then got it right  Thought Content:  Negative and Rumination  Suicidal Thoughts:  No  Homicidal Thoughts:  No  Memory:  Recent;   Poor  Judgement:  Impaired  Insight:  Lacking  Psychomotor Activity:  Decreased  Concentration:  Poor  Recall:  Fair  Akathisia:  No  Handed:  Right  AIMS (if indicated):     Assets:  Housing  Sleep:      Treatment Plan Summary: Patient unpredictable with poor insight. Denies suicidal toughts but has shown poor judjement.  Admit to Behavioural health once medically cleared and bed available.  Continue sitter for now and suicide precautions. Can start home medications except hydroxyzine.  Travaris Kosh 08/29/2013 11:28 AM

## 2013-08-29 NOTE — Progress Notes (Signed)
Per Baxter Hire at [facility], no beds available.  Baxter Hire stated that there may be beds available tomorrow.  Providence Crosby, LCSWA Clinical Social Work [phone]

## 2013-08-29 NOTE — Progress Notes (Signed)
Spoke with Koleen Nimrod at [facility].  Koleen Nimrod stated that their computers have been down all day and that they are just now getting to their stack of referrals. She will contact CSW with a disposition as soon as she has one.  Notified RN.  Providence Crosby, LCSWA Clinical Social Work [phone]

## 2013-08-30 DIAGNOSIS — F314 Bipolar disorder, current episode depressed, severe, without psychotic features: Secondary | ICD-10-CM

## 2013-08-30 MED ORDER — ALPRAZOLAM 1 MG PO TABS
1.0000 mg | ORAL_TABLET | Freq: Three times a day (TID) | ORAL | Status: DC | PRN
  Administered 2013-08-30 – 2013-08-31 (×3): 1 mg via ORAL
  Filled 2013-08-30 (×3): qty 1

## 2013-08-30 MED ORDER — IBUPROFEN 600 MG PO TABS
600.0000 mg | ORAL_TABLET | Freq: Four times a day (QID) | ORAL | Status: DC | PRN
  Administered 2013-08-30: 600 mg via ORAL
  Filled 2013-08-30: qty 1

## 2013-08-30 MED ORDER — FLUOXETINE HCL 20 MG PO CAPS
40.0000 mg | ORAL_CAPSULE | Freq: Every day | ORAL | Status: DC
  Administered 2013-08-30 – 2013-08-31 (×2): 40 mg via ORAL
  Filled 2013-08-30 (×2): qty 2

## 2013-08-30 NOTE — Progress Notes (Signed)
Per Baxter Hire at [facility], there will not be a bed available for Pt today.  Attempted to contact the assessment office at [facility].  Phone rang continuously.  Providence Crosby, LCSWA Clinical Social Work [phone]

## 2013-08-30 NOTE — Progress Notes (Signed)
[facility] PROGRESS NOTE  Jonathan Perez ZOX:[id number] DOB: 1976-03-18 DOA: 08/28/2013 PCP: No PCP Per Patient  Assessment/Plan: Principal Problem:  Drug overdose  - Overdosed on vistaril; responded well to narcan given in ED  - poison control contacted on admission  -patient refused repeat and also took a telemetry monitor  -Continue sitter at bedside for suicide precautions  -Appreciate psychiatry psychiatry input>> inpatient psych hospitalization recommended and patient's medically clear but awaiting an inpatient bed at this time Active Problems:  Transaminitis  -mild,  posssibly secondary to meds, Alcohol level in blood is less than 11 Suicide attempt - contiue sitter at bedside  -Await inpatient psych ADD (attention deficit disorder)  - 10 states he does not take adderall at home>> discontinued the  -psych consulted for further recs  depression/anxiety -Resume outpatient medications as per psych recommendations.   chronic back pain  -Methadone resumed.  Code Status: full Family Communication: none at bedside Disposition Plan: awaiting inpatient psych    Consultants:  Psychiatry  Procedures:  none  Antibiotics:  none  HPI/Subjective: Pt denies any new complaints, requesting to talk with psychiatrist.  Objective: Filed Vitals:   08/30/13 1403  BP: 128/90  Pulse: 77  Temp: 98 F (36.7 C)  Resp: 18    Intake/Output Summary (Last 24 hours) at 08/30/13 1413 Last data filed at 08/30/13 1100  Gross per 24 hour  Intake      0 ml  Output   1425 ml  Net  -1425 ml   Filed Weights   08/28/13 2147  Weight: 101.3 kg (223 lb 5.2 oz)    Exam:  General: Somnolent but easily aroused and oriented x 3  In NAD Cardiovascular: RRR, nl S1 s2 Respiratory: CTAB Abdomen: soft +BS NT/ND, no masses palpable Extremities: No cyanosis and no edema     Data Reviewed: Basic Metabolic Panel:  Recent Labs Lab 08/28/13 1440 08/28/13 2340 08/29/13 0324  NA  141  --  144  K 4.5  --  3.8  CL 107  --  110  CO2 24  --  26  GLUCOSE 94  --  78  BUN 11  --  10  CREATININE 0.95  --  0.96  CALCIUM 8.7  --  8.3*  MG  --  1.8  --   PHOS  --  3.8  --    Liver Function Tests:  Recent Labs Lab 08/28/13 1440  AST 41*  ALT 17  ALKPHOS 89  BILITOT 0.2*  PROT 6.5  ALBUMIN 3.4*   No results found for this basename: LIPASE, AMYLASE,  in the last 168 hours No results found for this basename: AMMONIA,  in the last 168 hours CBC:  Recent Labs Lab 08/28/13 1440 08/29/13 0324  WBC 8.9 9.9  HGB 15.2 13.8  HCT 44.7 41.9  MCV 95.7 97.0  PLT 159 208   Cardiac Enzymes:  Recent Labs Lab 08/28/13 2340 08/29/13 0324 08/29/13 0918  TROPONINI <0.30 <0.30 <0.30   BNP (last 3 results) No results found for this basename: PROBNP,  in the last 8760 hours CBG:  Recent Labs Lab 08/28/13 1417  GLUCAP 91    No results found for this or any previous visit (from the past 240 hour(s)).   Studies: Dg Chest 2 View  08/28/2013   CLINICAL DATA:  Drug overdose  EXAM: CHEST  2 VIEW  COMPARISON:  08/09/2013  FINDINGS: The heart size and mediastinal contours are within normal limits. Both lungs are clear. Previous  hardware fixation of the lumbar and thoracic spine.  IMPRESSION: No active cardiopulmonary disease.   Electronically Signed   By: Signa Kell M.D.   On: 08/28/2013 15:47   Ct Head Wo Contrast  08/28/2013   CLINICAL DATA:  Drug overdose  EXAM: CT HEAD WITHOUT CONTRAST  TECHNIQUE: Contiguous axial images were obtained from the base of the skull through the vertex without intravenous contrast.  COMPARISON:  11/16/2012  FINDINGS: The brain has a normal appearance. There is no evidence for acute infarct, mass or hemorrhage. The paranasal sinuses are clear. The mastoid air cells are clear. The skull is intact.  IMPRESSION: 1. No acute intracranial abnormalities.  Normal brain.   Electronically Signed   By: Signa Kell M.D.   On: 08/28/2013 15:18     Scheduled Meds: . carbamazepine  200 mg Oral TID  . divalproex  250 mg Oral Q12H  . enoxaparin (LOVENOX) injection  40 mg Subcutaneous QHS  . influenza vac split quadrivalent PF  0.5 mL Intramuscular Once  . methadone  40 mg Oral Daily  . sodium chloride  3 mL Intravenous Q12H   Continuous Infusions:    Principal Problem:   Drug overdose Active Problems:   ADD (attention deficit disorder)   Bipolar I disorder, most recent episode (or current) depressed, severe, without mention of psychotic behavior    Time spent: 25    [facility] C  [facility] Pager [phone]. If 7PM-7AM, please contact night-coverage at [email or web address], password [facility] 08/30/2013, 2:13 PM  LOS: 2 days

## 2013-08-30 NOTE — Progress Notes (Signed)
Attempted to meet with Pt.  Pt sound asleep.    Spoke with RN re: Pt.  RN stated that Pt eloped from the floor and that he was recently returned and given meds.  CSW to continue to find placement for Pt.  Providence Crosby, LCSWA Clinical Social Work [phone]

## 2013-08-30 NOTE — Progress Notes (Signed)
Per Julieanne Cotton at [facility], [facility] unable to accept Pt due to Pt's Methadone need.  Providence Crosby, LCSWA Clinical Social Work [phone]

## 2013-08-30 NOTE — Progress Notes (Signed)
Still no answer in the admissions office at [facility].  Okey Regal, the receptionist, assured me that someone was working in that office today; they continue to be busy with intakes.  Providence Crosby, LCSWA Clinical Social Work [phone]

## 2013-08-30 NOTE — Progress Notes (Signed)
Continue to attempt to contact someone in admissions at [facility].  Informed by the operator that an assessment worker is present, however they may be in an assessment and unable to answer the phone.  Spoke with Leotis Shames at [facility].  Per Leotis Shames, no male beds today but they anticipate male bed availability tomorrow.  Encouraged to fax Pt's information to them for review for possible admission tomorrow.  Faxed referral to [facility].  Providence Crosby, LCSWA Clinical Social Work [phone]

## 2013-08-30 NOTE — Progress Notes (Signed)
Per Waynetta Sandy at [facility], Pt has been accepted pending bed availability.  Facility may have a bed tomorrow.  Providence Crosby, LCSWA Clinical Social Work [phone]

## 2013-08-31 MED ORDER — OXYCODONE HCL 5 MG PO TABS
10.0000 mg | ORAL_TABLET | ORAL | Status: DC | PRN
  Administered 2013-08-31 (×2): 20 mg via ORAL
  Filled 2013-08-31 (×2): qty 4

## 2013-08-31 NOTE — Progress Notes (Signed)
[facility] PROGRESS NOTE  Jonathan Perez WGN:[id number] DOB: Aug 24, 1976 DOA: 08/28/2013 PCP: No PCP Per Patient  Assessment/Plan: Principal Problem:  Drug overdose  - Overdosed on vistaril; responded well to narcan given in ED  - poison control contacted on admission  -patient refused repeat ekg and also took a telemetry monitor  -pt had sitter at bedside for suicide precautions  -psychiatry was consulted and saw pt >> inpatient psych hospitalization recommended and patient was medically clear>>awaited bed till a today 9/19 when inpt psych bed became available and he is being dc'ed there for further treatment.  Active Problems:  Transaminitis  -mild,  posssibly secondary to meds, Alcohol level in blood is less than 11 Suicide attempt -as above sitter was at bedside for suicide precautions -he is being dc'ed to inpatient psych ADD (attention deficit disorder)  - pt stated he does not take adderall at home>> discontinued the   depression/anxiety -his outpatient medications were Resumed as per psych recommendations.   chronic back pain  -Methadone resumed.  Code Status: full Family Communication: none at bedside Disposition Plan: dc to inpatient psych   Consultants:  Psychiatry  Procedures:  none  Antibiotics:  none  HPI/Subjective: Pt denies any new complaints, insisting that he does not need to go to inpt psych.  Objective: Filed Vitals:   08/31/13 0447  BP: 129/89  Pulse: 78  Temp: 97.4 F (36.3 C)  Resp: 16    Intake/Output Summary (Last 24 hours) at 08/31/13 0955 Last data filed at 08/31/13 0900  Gross per 24 hour  Intake    240 ml  Output    700 ml  Net   -460 ml   Filed Weights   08/28/13 2147  Weight: 101.3 kg (223 lb 5.2 oz)    Exam:  General: Somnolent but easily aroused and oriented x 3  In NAD Cardiovascular: RRR, nl S1 s2 Respiratory: CTAB Abdomen: soft +BS NT/ND, no masses palpable Extremities: No cyanosis and no edema      Data Reviewed: Basic Metabolic Panel:  Recent Labs Lab 08/28/13 1440 08/28/13 2340 08/29/13 0324  NA 141  --  144  K 4.5  --  3.8  CL 107  --  110  CO2 24  --  26  GLUCOSE 94  --  78  BUN 11  --  10  CREATININE 0.95  --  0.96  CALCIUM 8.7  --  8.3*  MG  --  1.8  --   PHOS  --  3.8  --    Liver Function Tests:  Recent Labs Lab 08/28/13 1440  AST 41*  ALT 17  ALKPHOS 89  BILITOT 0.2*  PROT 6.5  ALBUMIN 3.4*   No results found for this basename: LIPASE, AMYLASE,  in the last 168 hours No results found for this basename: AMMONIA,  in the last 168 hours CBC:  Recent Labs Lab 08/28/13 1440 08/29/13 0324  WBC 8.9 9.9  HGB 15.2 13.8  HCT 44.7 41.9  MCV 95.7 97.0  PLT 159 208   Cardiac Enzymes:  Recent Labs Lab 08/28/13 2340 08/29/13 0324 08/29/13 0918  TROPONINI <0.30 <0.30 <0.30   BNP (last 3 results) No results found for this basename: PROBNP,  in the last 8760 hours CBG:  Recent Labs Lab 08/28/13 1417  GLUCAP 91    No results found for this or any previous visit (from the past 240 hour(s)).   Studies: No results found.  Scheduled Meds: . carbamazepine  200 mg  Oral TID  . divalproex  250 mg Oral Q12H  . enoxaparin (LOVENOX) injection  40 mg Subcutaneous QHS  . FLUoxetine  40 mg Oral Daily  . influenza vac split quadrivalent PF  0.5 mL Intramuscular Once  . methadone  40 mg Oral Daily  . sodium chloride  3 mL Intravenous Q12H   Continuous Infusions:    Principal Problem:   Drug overdose Active Problems:   ADD (attention deficit disorder)   Bipolar I disorder, most recent episode (or current) depressed, severe, without mention of psychotic behavior    Time spent: 15    [facility] C  [facility] Pager [phone]. If 7PM-7AM, please contact night-coverage at [email or web address], password [facility] 08/31/2013, 9:55 AM  LOS: 3 days

## 2013-08-31 NOTE — Progress Notes (Signed)
Clinical Social Work Department CLINICAL SOCIAL WORK PSYCHIATRY SERVICE LINE ASSESSMENT 08/31/2013  Patient:  Jonathan Perez  Account:  [id number]  Admit Date:  08/28/2013  Clinical Social Worker:  Unk Lightning, LCSW  Date/Time:  08/31/2013 09:45 AM Referred by:  Physician  Date referred:  08/31/2013 Reason for Referral  Psychosocial assessment   Presenting Symptoms/Problems (In the person's/family's own words):   Psych consulted due to patient attempted to overdose.   Abuse/Neglect/Trauma History (check all that apply)  Denies history   Abuse/Neglect/Trauma Comments:   Psychiatric History (check all that apply)  Outpatient treatment   Psychiatric medications:  Depakote 250 mg  Prozac 40 mg   Current Mental Health Hospitalizations/Previous Mental Health History:   Patient reports he was diagnosed with depression and has been seeing a psychiatrist in [facility] for medication management. Patient is not receiving any further treatment at this time but reports he had spoken to a therapist about starting counseling sessions.   Current provider:   Dr. Otelia Santee   Place and Date:   [place], [place]   Current Medications:   ALPRAZolam, ondansetron (ZOFRAN) IV, ondansetron, oxyCODONE            . carbamazepine  200 mg Oral TID  . divalproex  250 mg Oral Q12H  . enoxaparin (LOVENOX) injection  40 mg Subcutaneous QHS  . FLUoxetine  40 mg Oral Daily  . influenza vac split quadrivalent PF  0.5 mL Intramuscular Once  . methadone  40 mg Oral Daily  . sodium chloride  3 mL Intravenous Q12H   Previous Impatient Admission/Date/Reason:   Patient denies any previous hospitalizations.   Emotional Health / Current Symptoms    Suicide/Self Harm  Suicide attempt in past (date/description)   Suicide attempt in the past:   Patient reports he has attempted to hurt himself one time in the past but wife saw him and called 911 before he could hurt himself. Patient was admitted after overdosing on  medication. Patient denies any current SI or HI at this time.   Other harmful behavior:   None reported   Psychotic/Dissociative Symptoms  None reported   Other Psychotic/Dissociative Symptoms:   N/A    Attention/Behavioral Symptoms  Within Normal Limits   Other Attention / Behavioral Symptoms:   N/A    Cognitive Impairment  Within Normal Limits   Other Cognitive Impairment:   Patient alert and oriented during assessment.    Mood and Adjustment  Aggressive/frustrated  Guarded    Stress, Anxiety, Trauma, Any Recent Loss/Stressor  Relationship   Anxiety (frequency):   N/A   Phobia (specify):   N/A   Compulsive behavior (specify):   N/A   Obsessive behavior (specify):   N/A   Other:   Patient reports that he has a strained relationship with parents and ex-wife.   Substance Abuse/Use  Current substance use   SBIRT completed (please refer for detailed history):  N  Self-reported substance use:   Patient refused to answer questions regarding substance use. Per chart review, patient on methadone   Urinary Drug Screen Completed:  Y Alcohol level:   <11    Environmental/Housing/Living Arrangement  Stable housing   Who is in the home:   Parents   Emergency contact:  Billie-mom   Financial  Medicare   Patient's Strengths and Goals (patient's own words):   Patient reports he is compliant with medications and appointments with psychiatrist.   Clinical Social Worker's Interpretive Summary:   CSW received handoff from weekend  CSW regarding patient needing placement. CSW spoke with [facility] who had accepted patient and reports available bed today. RN to call report to [phone].    CSW met with patient at bedside in order to explain DC plans. Patient sitting in bed and watching TV when CSW arrived. CSW introduced myself and explained role. Patient is aware that he is under IVC but feels he should be able to DC home. Patient reports that he had a bad  day on Friday but is feeling fine today and needs to DC. CSW inquired about events that occurred on Friday but patient reports it was a personal matter and he did not want to share. Patient did make statements about having arguments with ex-wife. CSW asked if patient called crisis number through psychiatrist's office but patient reported he did not feel like talking at that time. Patient reports he had recently spoken to [facility] [place] about starting therapy so he could have consistent counseling.    CSW explained that patient had been accepted to [facility]. Patient resistant and reports he will not go to an inpatient facility at DC. CSW explained that since patient was under IVC and that psych MD recommended inpatient treatment that he would be transferred to facility by sheriff. Patient upset and tells CSW to leave the room.    CSW spoke with MD and RN who are aware of plans. CSW called and spoke with sheriff who is agreeable to transport patient to [facility].   Disposition:  Inpatient referral made [facility], [facility], Geri-psych)  [place], [place] [phone]

## 2013-08-31 NOTE — Progress Notes (Signed)
Pt agitated and uncooperative at time of discharge.  Discharge instructions were not reviewed and signed by pt.  Sheriff escorted pt.

## 2013-09-02 NOTE — ED Provider Notes (Signed)
Medical screening examination/treatment/procedure(s) were conducted as a shared visit with non-physician practitioner(s) or resident  and myself.  I personally evaluated the patient during the encounter and agree with the findings and plan unless otherwise indicated.    See my other note  Enid Skeens, MD 09/02/13 [phone]

## 2013-12-11 IMAGING — CR DG HIP (WITH OR WITHOUT PELVIS) 2-3V*L*
3 series · 3 of 3 positions shown · non-contrast
Comparison: None.

CLINICAL DATA: Hip pain.

LEFT HIP - COMPLETE 2+ VIEW

[t pelvis a.p.]
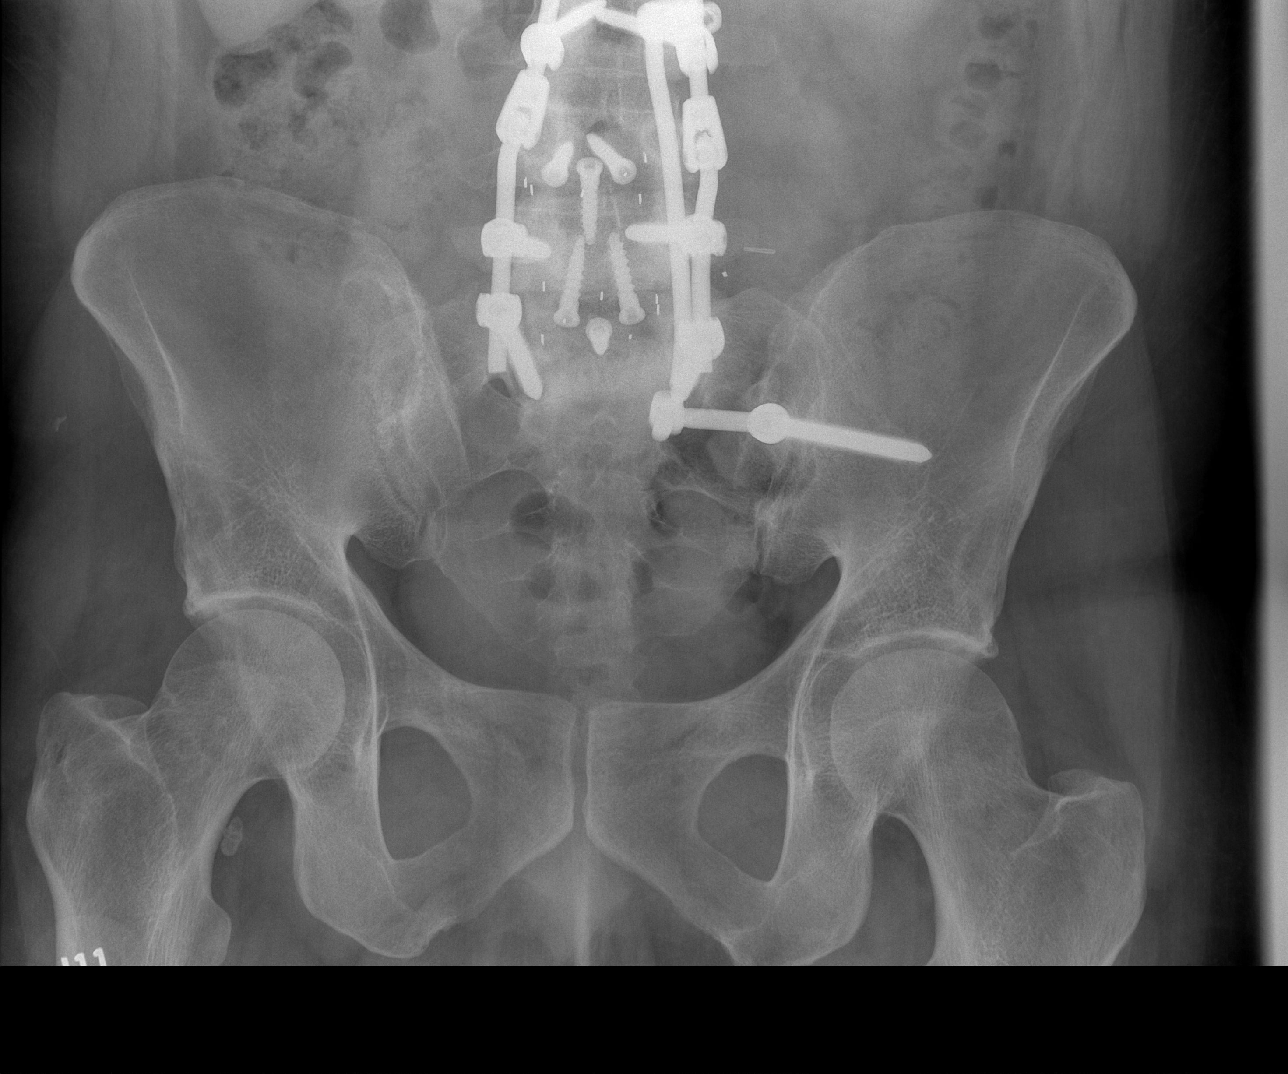

[t hip ap left]
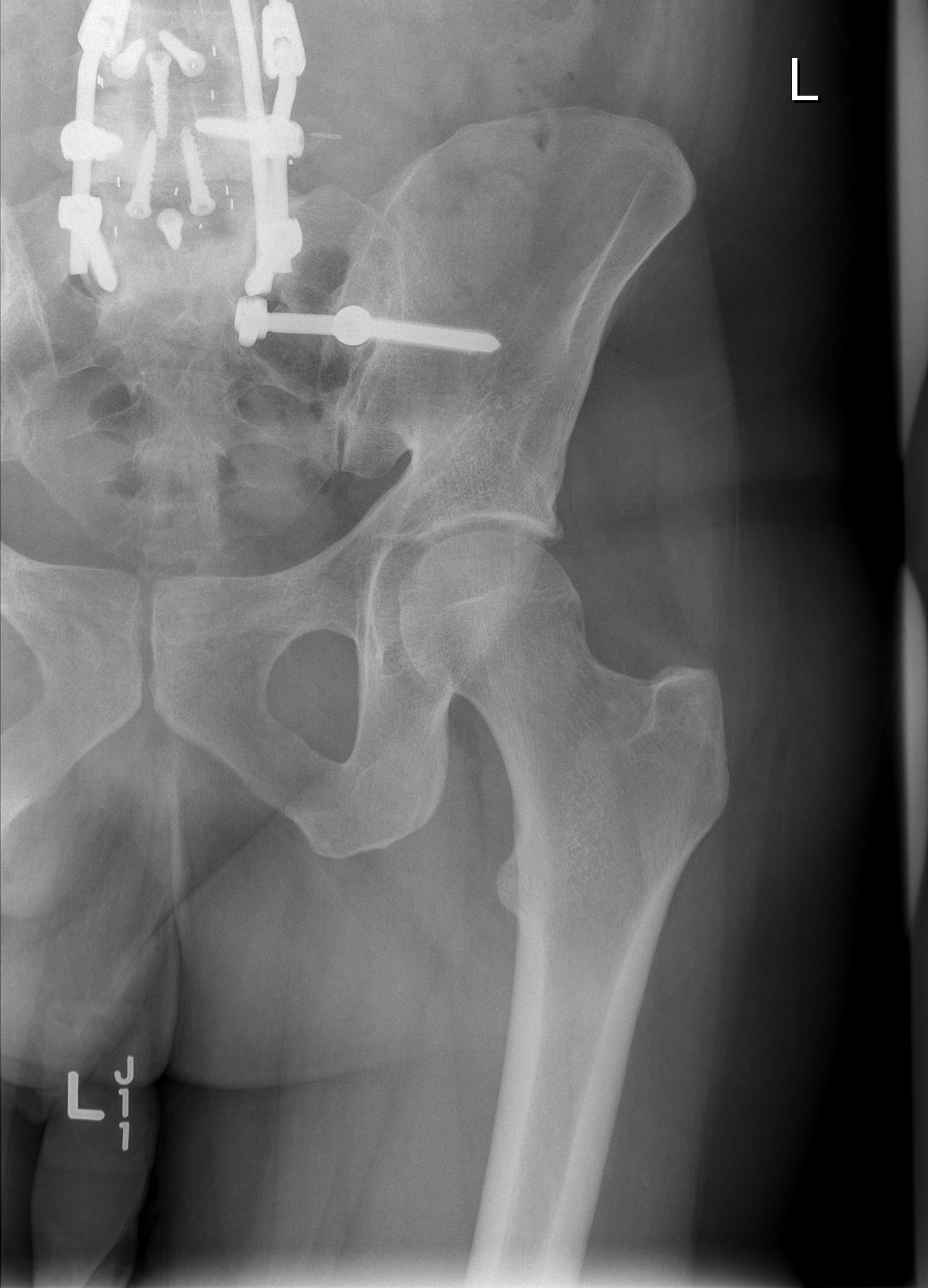

[t hip frog leg left]
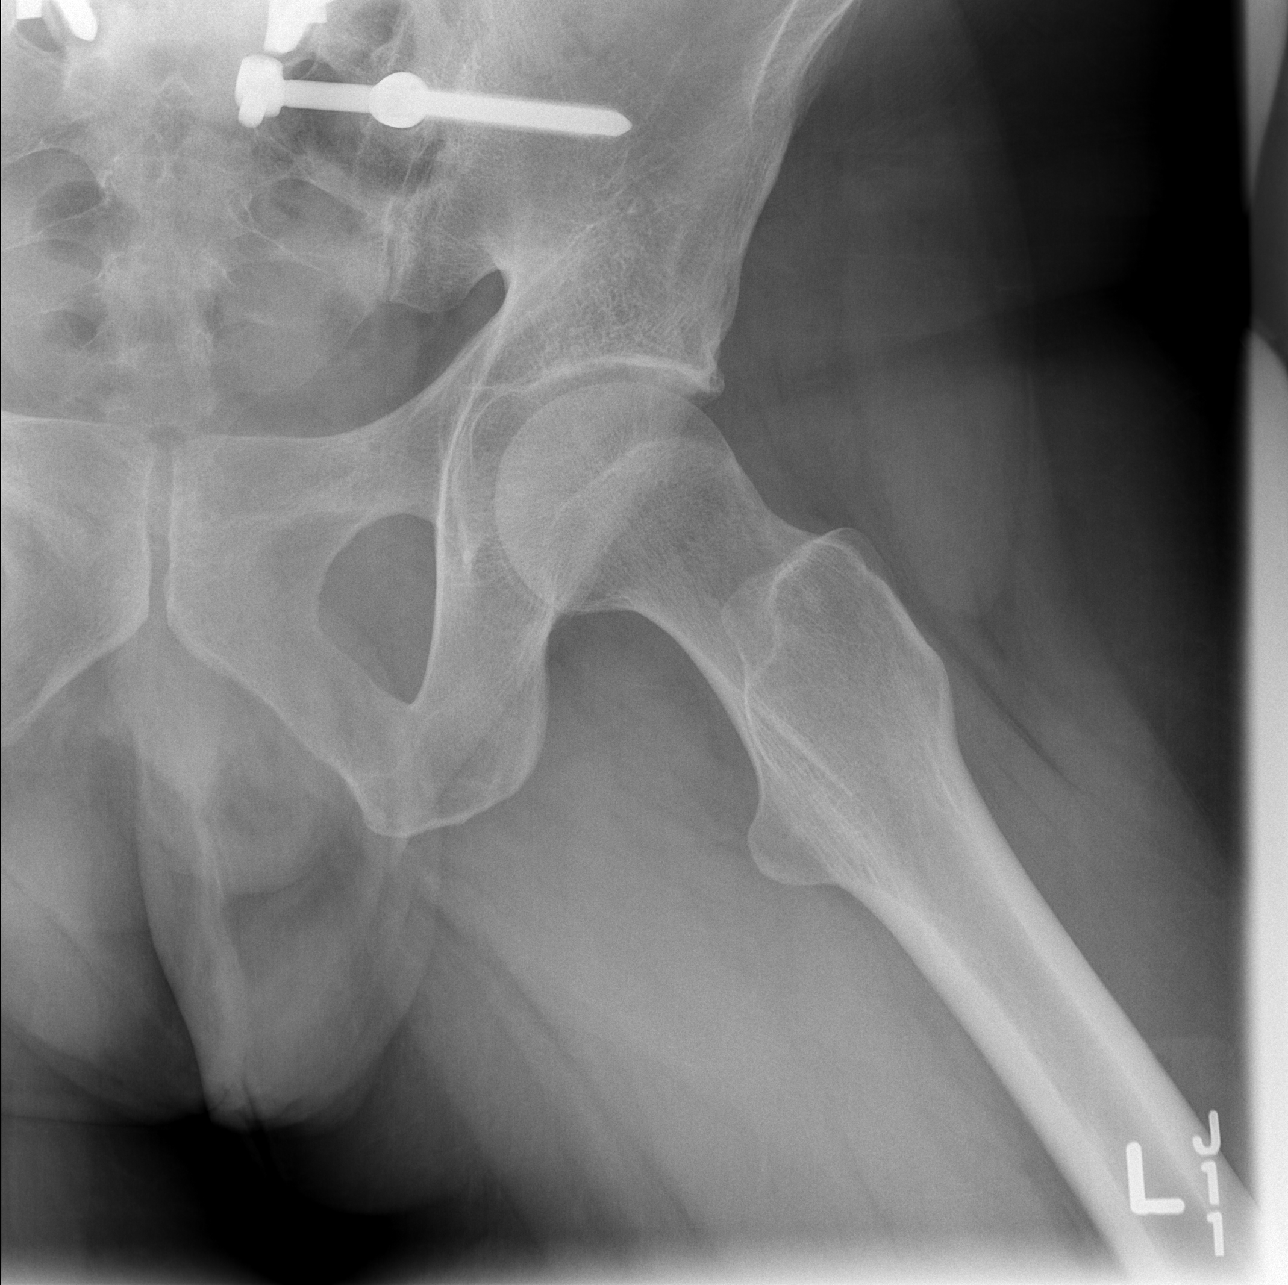

[3 of 3 positions shown; findings below may reference images not displayed]

FINDINGS: Postsurgical changes in the lower lumbar spine and across
the left SI joint.  Mild degenerative changes with joint space
narrowing and spurring in the hips bilaterally. No acute bony
abnormality.  Specifically, no fracture, subluxation, or
dislocation.  Soft tissues are intact.
IMPRESSION: No acute bony abnormality.

## 2014-03-09 ENCOUNTER — Emergency Department (HOSPITAL_COMMUNITY): Payer: Medicare Other

## 2014-03-09 ENCOUNTER — Inpatient Hospital Stay (HOSPITAL_COMMUNITY)
Admission: EM | Admit: 2014-03-09 | Discharge: 2014-03-10 | DRG: 917 | Disposition: A | Discharge status: Home / Self Care | Payer: Medicare Other | Attending: Internal Medicine | Admitting: Internal Medicine

## 2014-03-09 ENCOUNTER — Encounter (HOSPITAL_COMMUNITY): Payer: Self-pay | Admitting: Emergency Medicine

## 2014-03-09 DIAGNOSIS — F17200 Nicotine dependence, unspecified, uncomplicated: Secondary | ICD-10-CM | POA: Diagnosis present

## 2014-03-09 DIAGNOSIS — F319 Bipolar disorder, unspecified: Secondary | ICD-10-CM | POA: Diagnosis present

## 2014-03-09 DIAGNOSIS — F1994 Other psychoactive substance use, unspecified with psychoactive substance-induced mood disorder: Secondary | ICD-10-CM | POA: Diagnosis present

## 2014-03-09 DIAGNOSIS — R569 Unspecified convulsions: Secondary | ICD-10-CM | POA: Diagnosis present

## 2014-03-09 DIAGNOSIS — F988 Other specified behavioral and emotional disorders with onset usually occurring in childhood and adolescence: Secondary | ICD-10-CM | POA: Diagnosis present

## 2014-03-09 DIAGNOSIS — T50901A Poisoning by unspecified drugs, medicaments and biological substances, accidental (unintentional), initial encounter: Secondary | ICD-10-CM

## 2014-03-09 DIAGNOSIS — R4182 Altered mental status, unspecified: Secondary | ICD-10-CM

## 2014-03-09 DIAGNOSIS — F1410 Cocaine abuse, uncomplicated: Secondary | ICD-10-CM | POA: Diagnosis present

## 2014-03-09 DIAGNOSIS — Z23 Encounter for immunization: Secondary | ICD-10-CM

## 2014-03-09 DIAGNOSIS — G9340 Encephalopathy, unspecified: Secondary | ICD-10-CM | POA: Diagnosis present

## 2014-03-09 DIAGNOSIS — F1414 Cocaine abuse with cocaine-induced mood disorder: Secondary | ICD-10-CM

## 2014-03-09 DIAGNOSIS — T59894A Toxic effect of other specified gases, fumes and vapors, undetermined, initial encounter: Secondary | ICD-10-CM

## 2014-03-09 DIAGNOSIS — G894 Chronic pain syndrome: Secondary | ICD-10-CM | POA: Diagnosis present

## 2014-03-09 DIAGNOSIS — F0630 Mood disorder due to known physiological condition, unspecified: Secondary | ICD-10-CM

## 2014-03-09 DIAGNOSIS — F314 Bipolar disorder, current episode depressed, severe, without psychotic features: Secondary | ICD-10-CM

## 2014-03-09 DIAGNOSIS — F1120 Opioid dependence, uncomplicated: Secondary | ICD-10-CM | POA: Diagnosis present

## 2014-03-09 DIAGNOSIS — T5894XA Toxic effect of carbon monoxide from unspecified source, undetermined, initial encounter: Principal | ICD-10-CM | POA: Diagnosis present

## 2014-03-09 LAB — I-STAT ARTERIAL BLOOD GAS, ED
Acid-base deficit: 2 mmol/L (ref 0.0–2.0)
Bicarbonate: 23.6 mEq/L (ref 20.0–24.0)
O2 Saturation: 98 %
PCO2 ART: 41 mmHg (ref 35.0–45.0)
Patient temperature: 98.7
TCO2: 25 mmol/L (ref 0–100)
pH, Arterial: 7.368 (ref 7.350–7.450)
pO2, Arterial: 118 mmHg — ABNORMAL HIGH (ref 80.0–100.0)

## 2014-03-09 LAB — CBC
HCT: 45.4 % (ref 39.0–52.0)
Hemoglobin: 16.6 g/dL (ref 13.0–17.0)
MCH: 35.8 pg — AB (ref 26.0–34.0)
MCHC: 36.6 g/dL — AB (ref 30.0–36.0)
MCV: 97.8 fL (ref 78.0–100.0)
PLATELETS: 219 10*3/uL (ref 150–400)
RBC: 4.64 MIL/uL (ref 4.22–5.81)
RDW: 13.9 % (ref 11.5–15.5)
WBC: 11.8 10*3/uL — ABNORMAL HIGH (ref 4.0–10.5)

## 2014-03-09 LAB — VALPROIC ACID LEVEL: Valproic Acid Lvl: <10 ug/mL — ABNORMAL LOW (ref 50.0–100.0)

## 2014-03-09 LAB — CARBOXYHEMOGLOBIN
Carboxyhemoglobin: 4.5 % — ABNORMAL HIGH (ref 0.5–1.5)
Methemoglobin: 0.9 % (ref 0.0–1.5)
O2 SAT: 65.5 %
TOTAL HEMOGLOBIN: 15.4 g/dL (ref 13.5–18.0)

## 2014-03-09 LAB — RAPID URINE DRUG SCREEN, HOSP PERFORMED
Amphetamines: NOT DETECTED
Barbiturates: NOT DETECTED
Benzodiazepines: NOT DETECTED
COCAINE: POSITIVE — AB
OPIATES: NOT DETECTED
TETRAHYDROCANNABINOL: NOT DETECTED

## 2014-03-09 LAB — COMPREHENSIVE METABOLIC PANEL
ALBUMIN: 3.6 g/dL (ref 3.5–5.2)
ALT: 35 U/L (ref 0–53)
AST: 31 U/L (ref 0–37)
Alkaline Phosphatase: 50 U/L (ref 39–117)
BUN: 22 mg/dL (ref 6–23)
CALCIUM: 8.9 mg/dL (ref 8.4–10.5)
CO2: 20 mEq/L (ref 19–32)
Chloride: 103 mEq/L (ref 96–112)
Creatinine, Ser: 1.11 mg/dL (ref 0.50–1.35)
GFR calc Af Amer: >90 mL/min (ref >90)
GFR calc non Af Amer: 82 mL/min — ABNORMAL LOW (ref >90)
Glucose, Bld: 95 mg/dL (ref 70–99)
Potassium: 3.9 mEq/L (ref 3.7–5.3)
Sodium: 139 mEq/L (ref 137–147)
TOTAL PROTEIN: 6.7 g/dL (ref 6.0–8.3)
Total Bilirubin: 0.8 mg/dL (ref 0.3–1.2)

## 2014-03-09 LAB — AMMONIA: Ammonia: 34 umol/L (ref 11–60)

## 2014-03-09 LAB — ACETAMINOPHEN LEVEL: Acetaminophen (Tylenol), Serum: <15 ug/mL (ref 10–30)

## 2014-03-09 LAB — CARBAMAZEPINE LEVEL, TOTAL: Carbamazepine Lvl: <0.5 ug/mL — ABNORMAL LOW (ref 4.0–12.0)

## 2014-03-09 LAB — ETHANOL: Alcohol, Ethyl (B): <11 mg/dL (ref 0–11)

## 2014-03-09 LAB — CBG MONITORING, ED: Glucose-Capillary: 85 mg/dL (ref 70–99)

## 2014-03-09 LAB — SALICYLATE LEVEL: Salicylate Lvl: <2 mg/dL — ABNORMAL LOW (ref 2.8–20.0)

## 2014-03-09 MED ORDER — ENOXAPARIN SODIUM 40 MG/0.4ML ~~LOC~~ SOLN
40.0000 mg | SUBCUTANEOUS | Status: DC
  Administered 2014-03-10: 40 mg via SUBCUTANEOUS
  Filled 2014-03-09: qty 0.4

## 2014-03-09 MED ORDER — NALOXONE HCL 0.4 MG/ML IJ SOLN
0.4000 mg | Freq: Once | INTRAMUSCULAR | Status: AC
  Administered 2014-03-09: 0.4 mg via INTRAVENOUS

## 2014-03-09 MED ORDER — NALOXONE HCL 0.4 MG/ML IJ SOLN
0.4000 mg | Freq: Once | INTRAMUSCULAR | Status: AC
  Administered 2014-03-09: 0.4 mg via INTRAVENOUS
  Filled 2014-03-09: qty 1

## 2014-03-09 MED ORDER — SODIUM CHLORIDE 0.9 % IJ SOLN
3.0000 mL | Freq: Two times a day (BID) | INTRAMUSCULAR | Status: DC
  Administered 2014-03-09: 3 mL via INTRAVENOUS

## 2014-03-09 MED ORDER — VALPROATE SODIUM 500 MG/5ML IV SOLN
1000.0000 mg | Freq: Once | INTRAVENOUS | Status: AC
  Administered 2014-03-10: 1000 mg via INTRAVENOUS
  Filled 2014-03-09: qty 10

## 2014-03-09 MED ORDER — ONDANSETRON HCL 4 MG PO TABS: 4.0000 mg | ORAL_TABLET | Freq: Four times a day (QID) | ORAL | Status: DC | PRN

## 2014-03-09 MED ORDER — THIAMINE HCL 100 MG/ML IJ SOLN
100.0000 mg | Freq: Every day | INTRAMUSCULAR | Status: DC
  Administered 2014-03-10 (×2): 100 mg via INTRAVENOUS
  Filled 2014-03-09 (×2): qty 1

## 2014-03-09 MED ORDER — LORAZEPAM 2 MG/ML IJ SOLN: 1.0000 mg | INTRAMUSCULAR | Status: DC | PRN

## 2014-03-09 MED ORDER — SODIUM CHLORIDE 0.9 % IV SOLN
INTRAVENOUS | Status: DC
  Administered 2014-03-09 – 2014-03-10 (×2): via INTRAVENOUS

## 2014-03-09 MED ORDER — NALOXONE HCL 0.4 MG/ML IJ SOLN
INTRAMUSCULAR | Status: AC
  Administered 2014-03-09: 0.4 mg via INTRAVENOUS
  Filled 2014-03-09: qty 1

## 2014-03-09 MED ORDER — ONDANSETRON HCL 4 MG/2ML IJ SOLN: 4.0000 mg | Freq: Four times a day (QID) | INTRAMUSCULAR | Status: DC | PRN

## 2014-03-09 NOTE — H&P (Addendum)
[facility] History and Physical  Patient: Jonathan Perez  ZOX:[id number]  DOB: 1976/10/18  DOS: the patient was seen and examined on 03/09/2014 PCP: No PCP Per Patient  Chief Complaint: altered mental status  HPI: Jonathan Perez is a 38 y.o. male with Past medical history of seizure, ADD, chronic pain syndrome,bipolar disorder. The patient was brought in by EMS. He was found unresponsive in his car. EMS does not report any substance lying around him or any other documentation around him. The suspected that he had seizure-like activity on arrival. Patient has been seen hallucinating and both visually and auditory. Patient on Zosyn questions and follows command appropriately. Is not clear about whether it was a suicide ideation or not.it is unclear whether he is taking his medications or not. It is unclear whether he has taken overdose of some medication. In the past he was in the hospital for intentional drug overdose with hydroxyzine. After initial workup the patient was recommended to be updated by the hospitalist by ER.  The patient is coming from home.   Review of Systems: as mentioned in the history of present illness.  A Comprehensive review of the other systems is negative.  Past Medical History  Diagnosis Date  . Chronic back pain   . Kidney stones   . Kidney stones   . ADD (attention deficit disorder)   . Methadone use    Past Surgical History  Procedure Laterality Date  . Back surgery    . Hand surgery    . Hip surgery     Social History:  reports that he has been smoking.  He does not have any smokeless tobacco history on file. He reports that he drinks alcohol. He reports that he does not use illicit drugs.  Allergies  Allergen Reactions  . Reglan [Metoclopramide] Itching  . Toradol [Ketorolac Tromethamine] Itching    Family History  Problem Relation Age of Onset  . Diabetes Other   . Stroke Other     Prior to Admission medications   Medication  Sig Start Date End Date Taking? Authorizing Provider  ALPRAZolam Prudy Feeler) 1 MG tablet Take 1 mg by mouth 3 (three) times daily.    Historical Provider, MD  Aspirin-Salicylamide-Caffeine (BC FAST PAIN RELIEF) [phone] MG PACK Take 1 packet by mouth every 6 (six) hours as needed (pain).    Historical Provider, MD  carbamazepine (TEGRETOL) 200 MG tablet  08/25/13   Historical Provider, MD  clonazePAM (KLONOPIN) 0.5 MG tablet  08/26/13   Historical Provider, MD  divalproex (DEPAKOTE) 250 MG DR tablet  08/20/13   Historical Provider, MD  FLUoxetine (PROZAC) 20 MG capsule Take 40 mg by mouth daily.    Historical Provider, MD  methadone (DOLOPHINE) 10 MG tablet Take 40 mg by mouth daily.    Historical Provider, MD  perphenazine (TRILAFON) 2 MG tablet  08/25/13   Historical Provider, MD    Physical Exam: Filed Vitals:   03/09/14 2045 03/09/14 2100 03/09/14 2200 03/09/14 2215  BP: 132/81 141/82 125/79 117/91  Pulse: 88 94 76 87  Temp:      TempSrc:      Resp: 17 17    Height:      Weight:      SpO2: 95% 95% 98% 95%    General: Alert, Awake and Oriented to Person. Appear in marked distress Eyes: PERRL, dilated ENT: Oral Mucosa clear moist. Neck: no JVD Cardiovascular: S1 and S2 Present, no Murmur, Peripheral Pulses Present Respiratory: Bilateral Air  entry equal and Decreased, Clear to Auscultation,  no Crackles,no wheezes Abdomen: Bowel Sound Present, Soft and Non tender Skin: no Rash Extremities: no Pedal edema, no calf tenderness Neurologic: Mental status awake follows command, Cranial Nerves pupils are reactive bilaterally, Motor strength bilaterally equal strength , Sensation is present to light touch, reflexes normal, babinski equivocal, Cerebellar test normal finger-nose-finger.  Labs on Admission:  CBC:  Recent Labs Lab 03/09/14 1911  WBC 11.8*  HGB 16.6  HCT 45.4  MCV 97.8  PLT 219    CMP     Component Value Date/Time   NA 139 03/09/2014 1911   K 3.9 03/09/2014 1911    CL 103 03/09/2014 1911   CO2 20 03/09/2014 1911   GLUCOSE 95 03/09/2014 1911   BUN 22 03/09/2014 1911   CREATININE 1.11 03/09/2014 1911   CALCIUM 8.9 03/09/2014 1911   PROT 6.7 03/09/2014 1911   ALBUMIN 3.6 03/09/2014 1911   AST 31 03/09/2014 1911   ALT 35 03/09/2014 1911   ALKPHOS 50 03/09/2014 1911   BILITOT 0.8 03/09/2014 1911   GFRNONAA 82* 03/09/2014 1911   GFRAA >90 03/09/2014 1911    No results found for this basename: LIPASE, AMYLASE,  in the last 168 hours  Recent Labs Lab 03/09/14 1935  AMMONIA 34    No results found for this basename: CKTOTAL, CKMB, CKMBINDEX, TROPONINI,  in the last 168 hours BNP (last 3 results) No results found for this basename: PROBNP,  in the last 8760 hours  Radiological Exams on Admission: Ct Head Wo Contrast  03/09/2014   CLINICAL DATA:  Confusion, seizure activity  EXAM: CT HEAD WITHOUT CONTRAST  TECHNIQUE: Contiguous axial images were obtained from the base of the skull through the vertex without contrast.  COMPARISON:  08/28/2013  FINDINGS: Stable mild brain atrophy for the patient's young age. No acute intracranial hemorrhage, mass lesion, infarction, midline shift, herniation, hydrocephalus, or extra-axial fluid collection. Normal gray-white matter differentiation. Cisterns patent. No cerebellar abnormality. Mastoids and sinuses clear.  IMPRESSION: Stable exam.  No acute intracranial process   Electronically Signed   By: Ruel Favors M.D.   On: 03/09/2014 20:09    EKG: Independently reviewed. sinus tachycardia.  Assessment/Plan Principal Problem:   Acute encephalopathy Active Problems:   ADD (attention deficit disorder)   Altered mental state   Seizures   1. Acute encephalopathy The patient is presenting with complaints of unresponsiveness/ altered mental status.is a CT of the head is negative. His initial lab work shows normal LFT, normal serum creatinine, normal ammonia, hemoconcentration,normal Tylenol labile, negative carbamazepine and  valproic acidlevel He has mildly elevated carboxyhemoglobin. With this the patient will be admitted to the step down unit. He will be monitored for seizure activity. He would be monitored for suicidal activity. I would place him on IVs Ativan when necessary for the seizure IV fluids, I will restart his Depakote with a loading dose followed by 250 mg every 12 hours his routine dose. EEG in the morning Neurochecks every 4 hours and if there is any abnormality then he may require a neurological workup.  2.chronic pain Patient is on methadone 40 mg daily. If he is more awake tomorrow we will resume the medication.  3.mood disorder Once patient is more stable resume the medications as per home  IV thiamine replacement at present  DVT Prophylaxis: subcutaneous Heparin Nutrition: n.p.o.  Code Status: full  Disposition: Admitted to inpatient in step-down unit.  Author: Lynden Oxford, MD [facility] Pager: [phone] 03/09/2014,  10:55 PM    If 7PM-7AM, please contact night-coverage [email or web address] Password [email or web address]

## 2014-03-09 NOTE — ED Provider Notes (Signed)
CSN: [id number]     Arrival date & time 03/09/14  1857 History   First MD Initiated Contact with Patient 03/09/14 1858     Chief Complaint  Patient presents with  . Seizures     (Consider location/radiation/quality/duration/timing/severity/associated sxs/prior Treatment) HPI Comments: Found unresponsive in car by EMS, had seizure with EMS, no meds given.   Patient is a 38 y.o. male presenting with seizures. The history is provided by the patient.  Seizures Seizure activity on arrival: no   Seizure type:  Focal Initial focality:  None Episode characteristics: generalized shaking   Postictal symptoms: confusion   Return to baseline: no   Severity:  Mild Timing:  Once Alcohol withdrawal: possible, did drink today. Drug use: unsure.     Past Medical History  Diagnosis Date  . Chronic back pain   . Kidney stones   . Kidney stones   . ADD (attention deficit disorder)   . Methadone use    Past Surgical History  Procedure Laterality Date  . Back surgery    . Hand surgery    . Hip surgery     Family History  Problem Relation Age of Onset  . Diabetes Other   . Stroke Other    History  Substance Use Topics  . Smoking status: Current Every Day Smoker -- 0.50 packs/day  . Smokeless tobacco: Not on file  . Alcohol Use: Yes    Review of Systems  Unable to perform ROS: Mental status change  Neurological: Positive for seizures.    Level V caveat - altered mental status  Allergies  Reglan and Toradol  Home Medications   Current Outpatient Rx  Name  Route  Sig  Dispense  Refill  . ALPRAZolam (XANAX) 1 MG tablet   Oral   Take 1 mg by mouth 3 (three) times daily.         . Aspirin-Salicylamide-Caffeine (BC FAST PAIN RELIEF) 650-195-33.3 MG PACK   Oral   Take 1 packet by mouth every 6 (six) hours as needed (pain).         . carbamazepine (TEGRETOL) 200 MG tablet               . clonazePAM (KLONOPIN) 0.5 MG tablet               . divalproex (DEPAKOTE)  250 MG DR tablet               . FLUoxetine (PROZAC) 20 MG capsule   Oral   Take 40 mg by mouth daily.         . methadone (DOLOPHINE) 10 MG tablet   Oral   Take 40 mg by mouth daily.         Marland Kitchen. perphenazine (TRILAFON) 2 MG tablet                BP 122/72  Pulse 102  Temp(Src) 98 F (36.7 C) (Oral)  Resp 18  Ht 5\' 8"  (1.727 m)  Wt 200 lb (90.719 kg)  BMI 30.42 kg/m2  SpO2 99% Physical Exam  Nursing note and vitals reviewed. Constitutional: He appears well-developed and well-nourished. He appears lethargic. No distress.  HENT:  Head: Normocephalic and atraumatic.  Mouth/Throat: No oropharyngeal exudate.  Eyes: EOM are normal. Pupils are equal, round, and reactive to light.  Neck: Normal range of motion. Neck supple.  Cardiovascular: Normal rate and regular rhythm.  Exam reveals no friction rub.   No murmur heard. Pulmonary/Chest: Effort normal and breath  sounds normal. No respiratory distress. He has no wheezes. He has no rales.  Abdominal: He exhibits no distension. There is no tenderness. There is no rebound.  Musculoskeletal: Normal range of motion. He exhibits no edema.  Neurological: He appears lethargic. No cranial nerve deficit or sensory deficit. GCS eye subscore is 3. GCS verbal subscore is 4. GCS motor subscore is 6.  Skin: No rash noted. He is not diaphoretic.    ED Course  Procedures (including critical care time) Labs Review Labs Reviewed  CBC  COMPREHENSIVE METABOLIC PANEL  ETHANOL  URINE RAPID DRUG SCREEN (HOSP PERFORMED)  SALICYLATE LEVEL  ACETAMINOPHEN LEVEL  CARBAMAZEPINE LEVEL, TOTAL  BLOOD GAS, ARTERIAL   Imaging Review Ct Head Wo Contrast  03/09/2014   CLINICAL DATA:  Confusion, seizure activity  EXAM: CT HEAD WITHOUT CONTRAST  TECHNIQUE: Contiguous axial images were obtained from the base of the skull through the vertex without contrast.  COMPARISON:  08/28/2013  FINDINGS: Stable mild brain atrophy for the patient's young age. No  acute intracranial hemorrhage, mass lesion, infarction, midline shift, herniation, hydrocephalus, or extra-axial fluid collection. Normal gray-white matter differentiation. Cisterns patent. No cerebellar abnormality. Mastoids and sinuses clear.  IMPRESSION: Stable exam.  No acute intracranial process   Electronically Signed   By: Ruel Favors M.D.   On: 03/09/2014 20:09     EKG Interpretation   Date/Time:  Tuesday March 09 2014 19:03:22 EDT Ventricular Rate:  84 PR Interval:  116 QRS Duration: 96 QT Interval:  371 QTC Calculation: 438 R Axis:   56 Text Interpretation:  Sinus rhythm Borderline short PR interval Simlar to  prior Confirmed by Gwendolyn Grant  MD, Tenae Graziosi ([id number]) on 03/09/2014 7:13:34 PM      MDM   Final diagnoses:  Altered mental status    38 year old male presents unresponsive. He was found unresponsive by bystanders in his car. He had a 45 second generalized tonic-clonic seizure with EMS. Responsive to stimuli with EMS, Ativan given. Initial CBG 118, with [place] at 88. Patient reported to be diabetic by EMS, however no history of diabetes seen in the computer. History does show suicidal previously with drug overdose in September of last year. He is currently taking Tegretol, methadone, Prozac, Depakote. He will awake to questions, and will follow commands once awake. Moving all extremities, pupils 6 mm bilaterally and reactive. Unable to get reliable history from him.  When asked if he was trying to commit suicide, he says, "I just didn't care," and falls back asleep. Unclear history here. Will obtain UDS, ABG, CT Head. Concern for possible overdose. Plan for admission.  EKG without signs of TCA overdose. Normal intervals. No relief with narcan 0.4 mg x 2. Workup negative except for UDS positive for cocaine. Will admit to medicine, who wants stepdown, patient placed on O2 in case this is carbon monoxide poisoning, patient admitted.    Dagmar Hait, MD 03/09/14 [phone]

## 2014-03-09 NOTE — ED Notes (Signed)
RN went with pt to CT for continued monitoring.

## 2014-03-09 NOTE — ED Notes (Signed)
Pt to ED via [facility]- pt was found in his car at a gas station unresponsive- EMS arrived found pupils to be dilated.  En route pt had witnessed seizure activity lasting 45 seconds- pt responsive to stimuli upon arrival to ED.  CBG 118, denies ETOH or drug use.  BP 117/63, HR 102.

## 2014-03-10 DIAGNOSIS — F1994 Other psychoactive substance use, unspecified with psychoactive substance-induced mood disorder: Secondary | ICD-10-CM

## 2014-03-10 DIAGNOSIS — F1410 Cocaine abuse, uncomplicated: Secondary | ICD-10-CM

## 2014-03-10 DIAGNOSIS — F1120 Opioid dependence, uncomplicated: Secondary | ICD-10-CM

## 2014-03-10 DIAGNOSIS — Z9119 Patient's noncompliance with other medical treatment and regimen: Secondary | ICD-10-CM

## 2014-03-10 DIAGNOSIS — R569 Unspecified convulsions: Secondary | ICD-10-CM | POA: Diagnosis present

## 2014-03-10 DIAGNOSIS — R4182 Altered mental status, unspecified: Secondary | ICD-10-CM

## 2014-03-10 DIAGNOSIS — Z91199 Patient's noncompliance with other medical treatment and regimen due to unspecified reason: Secondary | ICD-10-CM

## 2014-03-10 LAB — MRSA PCR SCREENING: MRSA by PCR: NEGATIVE

## 2014-03-10 LAB — COMPREHENSIVE METABOLIC PANEL
ALT: 35 U/L (ref 0–53)
AST: 34 U/L (ref 0–37)
Albumin: 3.3 g/dL — ABNORMAL LOW (ref 3.5–5.2)
Alkaline Phosphatase: 49 U/L (ref 39–117)
BUN: 19 mg/dL (ref 6–23)
CALCIUM: 8.5 mg/dL (ref 8.4–10.5)
CO2: 25 mEq/L (ref 19–32)
Chloride: 105 mEq/L (ref 96–112)
Creatinine, Ser: 1.1 mg/dL (ref 0.50–1.35)
GFR calc non Af Amer: 83 mL/min — ABNORMAL LOW (ref >90)
GLUCOSE: 87 mg/dL (ref 70–99)
POTASSIUM: 4.7 meq/L (ref 3.7–5.3)
Sodium: 142 mEq/L (ref 137–147)
TOTAL PROTEIN: 6.4 g/dL (ref 6.0–8.3)
Total Bilirubin: 0.7 mg/dL (ref 0.3–1.2)

## 2014-03-10 LAB — BLOOD GAS, ARTERIAL
Acid-base deficit: 0.6 mmol/L (ref 0.0–2.0)
BICARBONATE: 24 meq/L (ref 20.0–24.0)
Drawn by: 39898
FIO2: 1 %
O2 Saturation: 99.8 %
PCO2 ART: 42.6 mmHg (ref 35.0–45.0)
PO2 ART: 375 mmHg — AB (ref 80.0–100.0)
Patient temperature: 98.6
TCO2: 25.3 mmol/L (ref 0–100)
pH, Arterial: 7.369 (ref 7.350–7.450)

## 2014-03-10 LAB — CBC WITH DIFFERENTIAL/PLATELET
Basophils Absolute: 0 10*3/uL (ref 0.0–0.1)
Basophils Relative: 0 % (ref 0–1)
EOS PCT: 2 % (ref 0–5)
Eosinophils Absolute: 0.2 10*3/uL (ref 0.0–0.7)
HCT: 45.2 % (ref 39.0–52.0)
Hemoglobin: 15.6 g/dL (ref 13.0–17.0)
LYMPHS ABS: 2.8 10*3/uL (ref 0.7–4.0)
Lymphocytes Relative: 32 % (ref 12–46)
MCH: 34.4 pg — AB (ref 26.0–34.0)
MCHC: 34.5 g/dL (ref 30.0–36.0)
MCV: 99.6 fL (ref 78.0–100.0)
Monocytes Absolute: 1.1 10*3/uL — ABNORMAL HIGH (ref 0.1–1.0)
Monocytes Relative: 12 % (ref 3–12)
Neutro Abs: 4.7 10*3/uL (ref 1.7–7.7)
Neutrophils Relative %: 54 % (ref 43–77)
PLATELETS: 196 10*3/uL (ref 150–400)
RBC: 4.54 MIL/uL (ref 4.22–5.81)
RDW: 13.8 % (ref 11.5–15.5)
WBC: 8.9 10*3/uL (ref 4.0–10.5)

## 2014-03-10 LAB — CARBOXYHEMOGLOBIN
Carboxyhemoglobin: 1.5 % (ref 0.5–1.5)
Methemoglobin: 0.9 % (ref 0.0–1.5)
O2 SAT: 65.1 %
TOTAL HEMOGLOBIN: 15.9 g/dL (ref 13.5–18.0)

## 2014-03-10 MED ORDER — METHADONE HCL 10 MG PO TABS
40.0000 mg | ORAL_TABLET | Freq: Every day | ORAL | Status: DC
  Filled 2014-03-10: qty 4

## 2014-03-10 MED ORDER — DIVALPROEX SODIUM 125 MG PO CPSP
250.0000 mg | ORAL_CAPSULE | Freq: Two times a day (BID) | ORAL | Status: DC
  Administered 2014-03-10: 250 mg via ORAL
  Filled 2014-03-10 (×2): qty 2

## 2014-03-10 MED ORDER — INFLUENZA VAC SPLIT QUAD 0.5 ML IM SUSP
0.5000 mL | Freq: Once | INTRAMUSCULAR | Status: AC
  Administered 2014-03-10: 0.5 mL via INTRAMUSCULAR
  Filled 2014-03-10: qty 0.5

## 2014-03-10 MED ORDER — PNEUMOCOCCAL VAC POLYVALENT 25 MCG/0.5ML IJ INJ
0.5000 mL | INJECTION | Freq: Once | INTRAMUSCULAR | Status: AC
  Administered 2014-03-10: 0.5 mL via INTRAMUSCULAR
  Filled 2014-03-10: qty 0.5

## 2014-03-10 MED ORDER — FLUOXETINE HCL 20 MG PO CAPS
40.0000 mg | ORAL_CAPSULE | Freq: Every day | ORAL | Status: DC
  Administered 2014-03-10: 40 mg via ORAL
  Filled 2014-03-10: qty 2

## 2014-03-10 MED ORDER — HALOPERIDOL LACTATE 5 MG/ML IJ SOLN
5.0000 mg | Freq: Once | INTRAMUSCULAR | Status: AC
  Administered 2014-03-10: 5 mg via INTRAVENOUS
  Filled 2014-03-10: qty 1

## 2014-03-10 MED ORDER — ALPRAZOLAM 0.5 MG PO TABS: 1.0000 mg | ORAL_TABLET | Freq: Two times a day (BID) | ORAL | Status: DC | PRN

## 2014-03-10 MED ORDER — INFLUENZA VAC SPLIT QUAD 0.5 ML IM SUSP
0.5000 mL | INTRAMUSCULAR | Status: DC
  Filled 2014-03-10: qty 0.5

## 2014-03-10 MED ORDER — PNEUMOCOCCAL VAC POLYVALENT 25 MCG/0.5ML IJ INJ
0.5000 mL | INJECTION | INTRAMUSCULAR | Status: DC
  Filled 2014-03-10: qty 0.5

## 2014-03-10 NOTE — Discharge Instructions (Signed)
You should resume your previously prescribed medications as you were taking them prior to your admission to the hospital.  Methadone has been removed from your medication list in that you explained to us that you have not taken this medication for quite some time.  [facility] do not provide narcotic pain medication prescriptions for patients with chronic pain.  You will need to followup with your usual outpatient physician for management of your chronic pain symptoms.  The Psychiatrist who visited you during this hospital stay did not feel that you need psychiatric medications at this time and therefore no new prescriptions are being provided at the time of your discharge.  If you are on chronic medications that are not listed in the above medication list please contact your primary care physician to determine if these should be continued or not.   Substance Abuse Your exam indicates that you have a problem with substance abuse. Substance abuse is the misuse of alcohol or drugs that causes problems in family life, friendships, and work relationships. Substance abuse is the most important cause of premature illness, disability, and death in our society. It is also the greatest threat to a person's mental and spiritual well being. Substance abuse can start out in an innocent way, such as social drinking or taking a little extra medication prescribed by your doctor. No one starts out with the intention of becoming an alcoholic or an addict. Substance abuse victims cannot control their use of alcohol or drugs. They may become intoxicated daily or go on weekend binges. Often there is a strong desire to quit, but attempts to stop using often fail. Encounters with law enforcement or conflicts with family members, friends, and work associates are signs of a potential problem. Recovery is always possible, although the craving for some drugs makes it difficult to quit without assistance. Many treatment  programs are available to help people stop abusing alcohol or drugs. The first step in treatment is to admit you have a problem. This is a major hurdle because denial is a powerful force with substance abuse. Alcoholics Anonymous, Narcotics Anonymous, Cocaine Anonymous, and other recovery groups and programs can be very useful in helping people to quit. If you do not feel okay about your drug or alcohol use and if it is causing you trouble, we want to encourage you to talk about it with your doctor or with someone from a recovery group who can help you. You could also call the [facility] at 1-800-662-HELP. It is up to you to take the first step. [employer] and A[employer]-TE[employer] are support groups for friends and family members of an alcohol or drug dependent person. The people who love and care for the alcoholic or addicted person often need help, too. For information about these organizations, check your phone directory or call a local alcohol or drug treatment center. Document Released: 01/03/2005 Document Revised: 02/18/2012 Document Reviewed: 11/27/2008 [facility] Patient Information 2014 [place], [place].

## 2014-03-10 NOTE — Progress Notes (Signed)
Ardis Hughsavid B Burdo to be D/C'd Home per MD order.  Discussed with the patient and all questions fully answered. Pt waiting on his step father for transportation,    Medication List    STOP taking these medications       ALPRAZolam 1 MG tablet  Commonly known as:  XANAX     BC FAST PAIN RELIEF 650-195-33.3 MG Pack  Generic drug:  Aspirin-Salicylamide-Caffeine     carbamazepine 200 MG tablet  Commonly known as:  TEGRETOL     clonazePAM 0.5 MG tablet  Commonly known as:  KLONOPIN     divalproex 250 MG DR tablet  Commonly known as:  DEPAKOTE     FLUoxetine 20 MG capsule  Commonly known as:  PROZAC     methadone 10 MG tablet  Commonly known as:  DOLOPHINE     perphenazine 2 MG tablet  Commonly known as:  TRILAFON        VVS, Skin clean, dry and intact without evidence of skin break down, no evidence of skin tears noted. IV catheter discontinued intact. Site without signs and symptoms of complications. Dressing and pressure applied.  An After Visit Summary was printed and given to the patient.  D/c education completed with patient/family including follow up instructions, medication list, d/c activities limitations if indicated, with other d/c instructions as indicated by MD - patient able to verbalize understanding, all questions fully answered.   Patient instructed to return to ED, call 911, or call MD for any changes in condition.   Patient escorted via WC, and D/C home with step father.  Osvaldo HumanSchiller, Isai Gottlie[facility] HospitalYolande 03/10/2014 7:31 PM

## 2014-03-10 NOTE — Progress Notes (Signed)
Pt admitted from ED with belongings. Pt's VSS. Pt is having visual and auditory hallucinations. Will continue to monitor closely.

## 2014-03-10 NOTE — Progress Notes (Signed)
Utilization review completed.  

## 2014-03-10 NOTE — Progress Notes (Signed)
[place] TEAM 1 - Stepdown/ICU TEAM Progress Note  Jonathan HughsDavid B Band ZOX:[id number] DOB: 10/18/1976 DOA: 03/09/2014 PCP: No PCP Per Patient  Admit HPI / Brief Narrative: 38 y.o. male with history of seizure, ADD, chronic pain syndrome, bipolar disorder, and prior suicidal ideation/attempts who was brought in by EMS. He was found unresponsive in his car. EMS did not report any substance lying around him. It was not clear whether this event represented an attempted suicide or not.  It was unclear whether he was taking his medications or not. It was unclear whether he had taken overdose of some medication. In the past he was in the hospital for intentional drug overdose with hydroxyzine.   HPI/Subjective: Since transfer to the step down unit the patient has become alert and interactive.  He has been quite agitated at times and has threatened to leave the hospital AMA on numerous occasions.  As per discussion with the nurse he "is not sure if he was trying to kill himself or not."  He has given conflicting reports concerning the narcotic pain medications that he is reportedly prescribed and has threatened to leave the hospital if he is not given the narcotics he requests.  Assessment/Plan:  Carbon monoxide poisoning The patient did have an elevated carboxyhemoglobin at the time of his presentation - this is explained by the fact that he was found in his car which must have been running - it is not clear if he was attempting to poison himself or if this was an accidental occurrence - a full psychiatric evaluation will be required to investigate this issue - Psychiatry has been consulted to see the patient - with oxygen therapy the patient's carboxyhemoglobin has normalized and he is now cleared from a medical standpoint   Bipolar disorder Patient was diagnosed with bipolar disorder as noted in a psychiatry note September 2014 - he has been quite agitated during his stay in the hospital and has attempted  leave AMA - after confirming that his QTC is not prolonged he was given a one-time dose of Haldol which for now seems to have controlled his agitation - further medication dosing will be at the suggestion of psychiatry  Chronic pain Old records do suggest the patient has a history of chronic pain - the patient complains to be on high-dose narcotics but I can find no documented evidence of this - his nurse called his pharmacy and confirmed that he has not been prescribed a narcotic medications that he states that he takes - his records suggests that he is on methadone but he claims that he has not taken this "for a long time" - will avoid narcotic dosing at this time as patient appears to be narcotic seeking - psychiatry consulted  Substance abuse Urine drug screen was positive for cocaine  Disposition Medically the patient is stabilized and is ready for a psychiatric disposition - given his prior history of suicide attempts on more than one occasion and the unclear circumstances surrounding this hospitalization I do not feel that he is safe for discharge home unless he has been cleared by psychiatrist - I suspect he will in fact require inpatient psychiatric care - if he attempts to a we will pursue involuntary commitment until otherwise instructed by psychiatry  Code Status: FULL Family Communication: no family present at time of exam Disposition Plan: Awaiting psychiatric disposition  Consultants: Psychiatry  Procedures: None  Antibiotics: None  DVT prophylaxis: SCDs  Objective: Blood pressure 126/89, pulse 68, temperature 98  F (36.7 C), temperature source Oral, resp. rate 16, height 5\' 8"  (1.727 m), weight 96.4 kg (212 lb 8.4 oz), SpO2 99.00%.  Intake/Output Summary (Last 24 hours) at 03/10/14 1529 Last data filed at 03/10/14 0900  Gross per 24 hour  Intake   1603 ml  Output    500 ml  Net   1103 ml   Exam: General: No acute respiratory distress - sedated at time of exam    Lungs: Clear to auscultation bilaterally without wheezes or crackles Cardiovascular: Regular rate and rhythm without murmur gallop or rub normal S1 and S2 Abdomen: Nontender, nondistended, soft, bowel sounds positive, no rebound, no ascites, no appreciable mass Extremities: No significant cyanosis, clubbing, or edema bilateral lower extremities  Data Reviewed: Basic Metabolic Panel:  Recent Labs Lab 03/09/14 1911 03/10/14 0440  NA 139 142  K 3.9 4.7  CL 103 105  CO2 20 25  GLUCOSE 95 87  BUN 22 19  CREATININE 1.11 1.10  CALCIUM 8.9 8.5   Liver Function Tests:  Recent Labs Lab 03/09/14 1911 03/10/14 0440  AST 31 34  ALT 35 35  ALKPHOS 50 49  BILITOT 0.8 0.7  PROT 6.7 6.4  ALBUMIN 3.6 3.3*    Recent Labs Lab 03/09/14 1935  AMMONIA 34   CBC:  Recent Labs Lab 03/09/14 1911 03/10/14 0440  WBC 11.8* 8.9  NEUTROABS  --  4.7  HGB 16.6 15.6  HCT 45.4 45.2  MCV 97.8 99.6  PLT 219 196   CBG:  Recent Labs Lab 03/09/14 1927  GLUCAP 85    Recent Results (from the past 240 hour(s))  MRSA PCR SCREENING     Status: None   Collection Time    03/09/14 11:13 PM      Result Value Ref Range Status   MRSA by PCR NEGATIVE  NEGATIVE Final   Comment:            The GeneXpert MRSA Assay (FDA     approved for NASAL specimens     only), is one component of a     comprehensive MRSA colonization     surveillance program. It is not     intended to diagnose MRSA     infection nor to guide or     monitor treatment for     MRSA infections.     Studies:  Recent x-ray studies have been reviewed in detail by the Attending Physician  Scheduled Meds:  Scheduled Meds: . divalproex  250 mg Oral Q12H  . enoxaparin (LOVENOX) injection  40 mg Subcutaneous Q24H  . FLUoxetine  40 mg Oral Daily  . [START ON 03/11/2014] influenza vac split quadrivalent PF  0.5 mL Intramuscular Tomorrow-1000  . methadone  40 mg Oral Daily  . [START ON 03/11/2014] pneumococcal 23 valent vaccine   0.5 mL Intramuscular Tomorrow-1000  . sodium chloride  3 mL Intravenous Q12H  . thiamine  100 mg Intravenous Daily    Time spent on care of this patient: 35 mins   [facility] T  [facility] Office  [phone] Pager - Text Page per Loretha Stapler as per below:  On-Call/Text Page:      [email or web address]      password [email or web address]  If 7PM-7AM, please contact night-coverage [email or web address] Password [email or web address] 03/10/2014, 3:29 PM   LOS: 1 day

## 2014-03-10 NOTE — Discharge Summary (Addendum)
DISCHARGE SUMMARY  Jonathan Perez  MR#: [id number]  DOB:06/01/1976  Date of Admission: 03/09/2014 Date of Discharge: 03/10/2014  Attending Physician:Shawnmichael Parenteau T  Patient's PCP:No PCP Per Patient  Consults: Psychiatry  Disposition: Discharge home  Follow-up Appts:     Follow-up Information   Call [facility].   Contact information:   Contact [facility] for outpatient substance abuse counseling as directed by Psychiatry      Call Your usual pain management MD.   Contact information:   Contact your usual pain managment MD for ongiong care of your chronic pain / refills of your medications / instructions on medical treatment of your pain.        Discharge Diagnoses: Carbon monoxide poisoning Opiate dependence  Cocaine abuse and intoxication Substance induced mood disorder Chronic pain  Initial presentation: 38 y.o. male with history of ADD, chronic pain syndrome, ?bipolar disorder, and prior suicidal ideation/attempts who was brought in by EMS. He was found unresponsive in his car. EMS did not report any substance lying around him. It was not clear whether this event represented an attempted suicide or not. It was unclear whether he was taking his medications or not. It was unclear whether he had taken overdose of some medication. In the past he was in the hospital for intentional drug overdose with hydroxyzine.   Hospital Course:  Carbon monoxide poisoning  The patient did have an elevated carboxyhemoglobin at the time of his presentation - this is explained by the fact that he was found in his car which must have been running - it is not clear if he was attempting to poison himself or if this was an accidental occurrence -Psychiatry provided consultation and found no evidence that the patient was a risk to himself or the patient attempted to harm himself - he recovered from his mild carbon monoxide poisoning with no permanent sequelae  Bipolar disorder  Patient was diagnosed with  bipolar disorder as noted in a psychiatry note September 2014 - he had been quite agitated during his stay in the hospital and had attempted leave AMA - after confirming that his QTC was not prolonged he was given a one-time dose of Haldol which controlled his agitation - he was able to awaken and converse with no difficulty with psychiatry - psychiatry suggested no medical therapy is indicated at the present time   Chronic pain  Old records do suggest the patient has a history of chronic pain - the patient reports to be on high-dose narcotics but I could find no documented evidence of this - his nurse called his pharmacy and confirmed that he had not been prescribed a narcotic medications that he states that he takes - his records suggested that he is on methadone but he claims that he has not taken this "for a long time" - no narcotics are provided at the time of discharge - patient is referred to outpatient [facility] per psychiatry  Substance abuse  Urine drug screen was positive for cocaine   Disposition  Medically the patient stabilized rapidly - psychiatry evaluated the patient and did not feel that he poses a risk to himself - he was diagnosed with opiate dependence and substance induced mood disorder - it was recommended that he followup with an outpatient substance abuse program and no specific psychiatric medications are felt to be indicated at this time    Medication List  No prescriptions were provided at the time of discharge.  No psychiatric medications were suggested by the consulting Psychiatrist.  The patient reported that the below listed medications were not accurate/that he has not taking any of them in quite some time and therefore they were simply listed as "stop" status at the time of his discharge.      STOP taking these medications       ALPRAZolam 1 MG tablet  Commonly known as:  XANAX     BC FAST PAIN RELIEF 650-195-33.3 MG Pack  Generic drug:  Aspirin-Salicylamide-Caffeine      carbamazepine 200 MG tablet  Commonly known as:  TEGRETOL     clonazePAM 0.5 MG tablet  Commonly known as:  KLONOPIN     divalproex 250 MG DR tablet  Commonly known as:  DEPAKOTE     FLUoxetine 20 MG capsule  Commonly known as:  PROZAC     methadone 10 MG tablet  Commonly known as:  DOLOPHINE     perphenazine 2 MG tablet  Commonly known as:  TRILAFON       Day of Discharge BP 129/71  Pulse 81  Temp(Src) 98 F (36.7 C) (Oral)  Resp 11  Ht 5\' 8"  (1.727 m)  Wt 96.4 kg (212 lb 8.4 oz)  BMI 32.32 kg/m2  SpO2 92%  Physical Exam: As noted in the progress note from earlier the same day of discharge.  Results for orders placed during the hospital encounter of 03/09/14 (from the past 24 hour(s))  CBC     Status: Abnormal   Collection Time    03/09/14  7:11 PM      Result Value Ref Range   WBC 11.8 (*) 4.0 - 10.5 K/uL   RBC 4.64  4.22 - 5.81 MIL/uL   Hemoglobin 16.6  13.0 - 17.0 g/dL   HCT 16.1  09.6 - 04.5 %   MCV 97.8  78.0 - 100.0 fL   MCH 35.8 (*) 26.0 - 34.0 pg   MCHC 36.6 (*) 30.0 - 36.0 g/dL   RDW 40.9  81.1 - 91.4 %   Platelets 219  150 - 400 K/uL  COMPREHENSIVE METABOLIC PANEL     Status: Abnormal   Collection Time    03/09/14  7:11 PM      Result Value Ref Range   Sodium 139  137 - 147 mEq/L   Potassium 3.9  3.7 - 5.3 mEq/L   Chloride 103  96 - 112 mEq/L   CO2 20  19 - 32 mEq/L   Glucose, Bld 95  70 - 99 mg/dL   BUN 22  6 - 23 mg/dL   Creatinine, Ser 7.82  0.50 - 1.35 mg/dL   Calcium 8.9  8.4 - 95.6 mg/dL   Total Protein 6.7  6.0 - 8.3 g/dL   Albumin 3.6  3.5 - 5.2 g/dL   AST 31  0 - 37 U/L   ALT 35  0 - 53 U/L   Alkaline Phosphatase 50  39 - 117 U/L   Total Bilirubin 0.8  0.3 - 1.2 mg/dL   GFR calc non Af Amer 82 (*) >90 mL/min   GFR calc Af Amer >90  >90 mL/min  ETHANOL     Status: None   Collection Time    03/09/14  7:11 PM      Result Value Ref Range   Alcohol, Ethyl (B) <11  0 - 11 mg/dL  SALICYLATE LEVEL     Status: Abnormal    Collection Time    03/09/14  7:11 PM      Result Value Ref  Range   Salicylate Lvl <2.0 (*) 2.8 - 20.0 mg/dL  ACETAMINOPHEN LEVEL     Status: None   Collection Time    03/09/14  7:11 PM      Result Value Ref Range   Acetaminophen (Tylenol), Serum <15.0  10 - 30 ug/mL  CARBAMAZEPINE LEVEL, TOTAL     Status: Abnormal   Collection Time    03/09/14  7:11 PM      Result Value Ref Range   Carbamazepine Lvl <0.5 (*) 4.0 - 12.0 ug/mL  CBG MONITORING, ED     Status: None   Collection Time    03/09/14  7:27 PM      Result Value Ref Range   Glucose-Capillary 85  70 - 99 mg/dL  VALPROIC ACID LEVEL     Status: Abnormal   Collection Time    03/09/14  7:32 PM      Result Value Ref Range   Valproic Acid Lvl <10.0 (*) 50.0 - 100.0 ug/mL  AMMONIA     Status: None   Collection Time    03/09/14  7:35 PM      Result Value Ref Range   Ammonia 34  11 - 60 umol/L  I-STAT ARTERIAL BLOOD GAS, ED     Status: Abnormal   Collection Time    03/09/14  8:10 PM      Result Value Ref Range   pH, Arterial 7.368  7.350 - 7.450   pCO2 arterial 41.0  35.0 - 45.0 mmHg   pO2, Arterial 118.0 (*) 80.0 - 100.0 mmHg   Bicarbonate 23.6  20.0 - 24.0 mEq/L   TCO2 25  0 - 100 mmol/L   O2 Saturation 98.0     Acid-base deficit 2.0  0.0 - 2.0 mmol/L   Patient temperature 98.7 F     Collection site RADIAL, ALLEN'S TEST ACCEPTABLE     Drawn by RT     Sample type ARTERIAL    URINE RAPID DRUG SCREEN (HOSP PERFORMED)     Status: Abnormal   Collection Time    03/09/14  8:35 PM      Result Value Ref Range   Opiates NONE DETECTED  NONE DETECTED   Cocaine POSITIVE (*) NONE DETECTED   Benzodiazepines NONE DETECTED  NONE DETECTED   Amphetamines NONE DETECTED  NONE DETECTED   Tetrahydrocannabinol NONE DETECTED  NONE DETECTED   Barbiturates NONE DETECTED  NONE DETECTED  CARBOXYHEMOGLOBIN     Status: Abnormal   Collection Time    03/09/14  9:56 PM      Result Value Ref Range   Total hemoglobin 15.4  13.5 - 18.0 g/dL   O2  Saturation 09.865.5     Carboxyhemoglobin 4.5 (*) 0.5 - 1.5 %   Methemoglobin 0.9  0.0 - 1.5 %  MRSA PCR SCREENING     Status: None   Collection Time    03/09/14 11:13 PM      Result Value Ref Range   MRSA by PCR NEGATIVE  NEGATIVE  BLOOD GAS, ARTERIAL     Status: Abnormal   Collection Time    03/10/14  3:44 AM      Result Value Ref Range   FIO2 1.00     Delivery systems NON-REBREATHER OXYGEN MASK     pH, Arterial 7.369  7.350 - 7.450   pCO2 arterial 42.6  35.0 - 45.0 mmHg   pO2, Arterial 375.0 (*) 80.0 - 100.0 mmHg   Bicarbonate 24.0  20.0 - 24.0 mEq/L  TCO2 25.3  0 - 100 mmol/L   Acid-base deficit 0.6  0.0 - 2.0 mmol/L   O2 Saturation 99.8     Patient temperature 98.6     Collection site LEFT RADIAL     Drawn by [phone]     Sample type ARTERIAL DRAW     Allens test (pass/fail) PASS  PASS  CBC WITH DIFFERENTIAL     Status: Abnormal   Collection Time    03/10/14  4:40 AM      Result Value Ref Range   WBC 8.9  4.0 - 10.5 K/uL   RBC 4.54  4.22 - 5.81 MIL/uL   Hemoglobin 15.6  13.0 - 17.0 g/dL   HCT 56.3  87.5 - 64.3 %   MCV 99.6  78.0 - 100.0 fL   MCH 34.4 (*) 26.0 - 34.0 pg   MCHC 34.5  30.0 - 36.0 g/dL   RDW 32.9  51.8 - 84.1 %   Platelets 196  150 - 400 K/uL   Neutrophils Relative % 54  43 - 77 %   Neutro Abs 4.7  1.7 - 7.7 K/uL   Lymphocytes Relative 32  12 - 46 %   Lymphs Abs 2.8  0.7 - 4.0 K/uL   Monocytes Relative 12  3 - 12 %   Monocytes Absolute 1.1 (*) 0.1 - 1.0 K/uL   Eosinophils Relative 2  0 - 5 %   Eosinophils Absolute 0.2  0.0 - 0.7 K/uL   Basophils Relative 0  0 - 1 %   Basophils Absolute 0.0  0.0 - 0.1 K/uL  COMPREHENSIVE METABOLIC PANEL     Status: Abnormal   Collection Time    03/10/14  4:40 AM      Result Value Ref Range   Sodium 142  137 - 147 mEq/L   Potassium 4.7  3.7 - 5.3 mEq/L   Chloride 105  96 - 112 mEq/L   CO2 25  19 - 32 mEq/L   Glucose, Bld 87  70 - 99 mg/dL   BUN 19  6 - 23 mg/dL   Creatinine, Ser 6.60  0.50 - 1.35 mg/dL   Calcium  8.5  8.4 - 63.0 mg/dL   Total Protein 6.4  6.0 - 8.3 g/dL   Albumin 3.3 (*) 3.5 - 5.2 g/dL   AST 34  0 - 37 U/L   ALT 35  0 - 53 U/L   Alkaline Phosphatase 49  39 - 117 U/L   Total Bilirubin 0.7  0.3 - 1.2 mg/dL   GFR calc non Af Amer 83 (*) >90 mL/min   GFR calc Af Amer >90  >90 mL/min  CARBOXYHEMOGLOBIN     Status: None   Collection Time    03/10/14  7:00 AM      Result Value Ref Range   Total hemoglobin 15.9  13.5 - 18.0 g/dL   O2 Saturation 16.0     Carboxyhemoglobin 1.5  0.5 - 1.5 %   Methemoglobin 0.9  0.0 - 1.5 %    Time spent in discharge (includes decision making & examination of pt): 30 minutes  03/10/2014, 6:38 PM   Lonia Blood, MD [facility] Office  [phone] Pager [phone]  On-Call/Text Page:      [email or web address]      password [facility]

## 2014-03-10 NOTE — Consult Note (Signed)
Reason for Consult: cocaine intoxication and non compliant with medications    Jonathan Perez is an 38 y.o. male.   HPI: Jonathan Perez is a 38 y.o. male presented with unresponsive in his car and his UDS is positive for cocaine.Patient stated that he was involved with his back and than addicted to pain medications and later heroin and was in jail for about six months and completed substance abuse program over there. He has briefly seen a psychiatrist and was diagnosed with mood disorder but non compliant with his medications. His mother stated that he was never taken medications responsible way. He does not get along with his wife and has no children. He has contact with his mother and father. Patient has denied current symptoms of depression, anxiety and psychosis. He also stated that he does not like some body telling him what to do. He does not want talk about his personal business and personal life.he stated that he was 100% disabled secondary chronic pain syndrome. He was a high school graduate and has worked on [place] in the past.   .  Review of Systems: as mentioned in the history of present illness. A Comprehensive review of the other systems is negative.  MSE: Patient was sleeping with mild snore when walked into the room. He was calm and cooperative after awaken with mild stimuli. He has denied depressed mood and affect is appropriate. He has provided phone number to his mother for collateral information. He has normal speech and thought process. He has no evidence of suicidal or homicidal ideations, intention or plans.   Past Medical History  Diagnosis Date  . Chronic back pain   . Kidney stones   . Kidney stones   . ADD (attention deficit disorder)   . Methadone use     Past Surgical History  Procedure Laterality Date  . Back surgery    . Hand surgery    . Hip surgery      Family History  Problem Relation Age of Onset  . Diabetes Other   . Stroke Other     Social  History:  reports that he has been smoking.  He does not have any smokeless tobacco history on file. He reports that he drinks alcohol. He reports that he does not use illicit drugs.  Allergies:  Allergies  Allergen Reactions  . Reglan [Metoclopramide] Itching  . Toradol [Ketorolac Tromethamine] Itching    Medications: I have reviewed the patient's current medications.  Results for orders placed during the hospital encounter of 03/09/14 (from the past 48 hour(s))  CBC     Status: Abnormal   Collection Time    03/09/14  7:11 PM      Result Value Ref Range   WBC 11.8 (*) 4.0 - 10.5 K/uL   RBC 4.64  4.22 - 5.81 MIL/uL   Hemoglobin 16.6  13.0 - 17.0 g/dL   HCT 45.4  39.0 - 52.0 %   MCV 97.8  78.0 - 100.0 fL   MCH 35.8 (*) 26.0 - 34.0 pg   MCHC 36.6 (*) 30.0 - 36.0 g/dL   RDW 13.9  11.5 - 15.5 %   Platelets 219  150 - 400 K/uL  COMPREHENSIVE METABOLIC PANEL     Status: Abnormal   Collection Time    03/09/14  7:11 PM      Result Value Ref Range   Sodium 139  137 - 147 mEq/L   Potassium 3.9  3.7 - 5.3 mEq/L  Chloride 103  96 - 112 mEq/L   CO2 20  19 - 32 mEq/L   Glucose, Bld 95  70 - 99 mg/dL   BUN 22  6 - 23 mg/dL   Creatinine, Ser 1.11  0.50 - 1.35 mg/dL   Calcium 8.9  8.4 - 10.5 mg/dL   Total Protein 6.7  6.0 - 8.3 g/dL   Albumin 3.6  3.5 - 5.2 g/dL   AST 31  0 - 37 U/L   ALT 35  0 - 53 U/L   Alkaline Phosphatase 50  39 - 117 U/L   Total Bilirubin 0.8  0.3 - 1.2 mg/dL   GFR calc non Af Amer 82 (*) >90 mL/min   GFR calc Af Amer >90  >90 mL/min   Comment: (NOTE)     The eGFR has been calculated using the CKD EPI equation.     This calculation has not been validated in all clinical situations.     eGFR's persistently <90 mL/min signify possible Chronic Kidney     Disease.  ETHANOL     Status: None   Collection Time    03/09/14  7:11 PM      Result Value Ref Range   Alcohol, Ethyl (B) <11  0 - 11 mg/dL   Comment:            LOWEST DETECTABLE LIMIT FOR     SERUM  ALCOHOL IS 11 mg/dL     FOR MEDICAL PURPOSES ONLY  SALICYLATE LEVEL     Status: Abnormal   Collection Time    03/09/14  7:11 PM      Result Value Ref Range   Salicylate Lvl <4.0 (*) 2.8 - 20.0 mg/dL  ACETAMINOPHEN LEVEL     Status: None   Collection Time    03/09/14  7:11 PM      Result Value Ref Range   Acetaminophen (Tylenol), Serum <15.0  10 - 30 ug/mL   Comment:            THERAPEUTIC CONCENTRATIONS VARY     SIGNIFICANTLY. A RANGE OF 10-30     ug/mL MAY BE AN EFFECTIVE     CONCENTRATION FOR MANY PATIENTS.     HOWEVER, SOME ARE BEST TREATED     AT CONCENTRATIONS OUTSIDE THIS     RANGE.     ACETAMINOPHEN CONCENTRATIONS     >150 ug/mL AT 4 HOURS AFTER     INGESTION AND >50 ug/mL AT 12     HOURS AFTER INGESTION ARE     OFTEN ASSOCIATED WITH TOXIC     REACTIONS.  CARBAMAZEPINE LEVEL, TOTAL     Status: Abnormal   Collection Time    03/09/14  7:11 PM      Result Value Ref Range   Carbamazepine Lvl <0.5 (*) 4.0 - 12.0 ug/mL  CBG MONITORING, ED     Status: None   Collection Time    03/09/14  7:27 PM      Result Value Ref Range   Glucose-Capillary 85  70 - 99 mg/dL  VALPROIC ACID LEVEL     Status: Abnormal   Collection Time    03/09/14  7:32 PM      Result Value Ref Range   Valproic Acid Lvl <10.0 (*) 50.0 - 100.0 ug/mL  AMMONIA     Status: None   Collection Time    03/09/14  7:35 PM      Result Value Ref Range   Ammonia 34  11 - 60 umol/L  I-STAT ARTERIAL BLOOD GAS, ED     Status: Abnormal   Collection Time    03/09/14  8:10 PM      Result Value Ref Range   pH, Arterial 7.368  7.350 - 7.450   pCO2 arterial 41.0  35.0 - 45.0 mmHg   pO2, Arterial 118.0 (*) 80.0 - 100.0 mmHg   Bicarbonate 23.6  20.0 - 24.0 mEq/L   TCO2 25  0 - 100 mmol/L   O2 Saturation 98.0     Acid-base deficit 2.0  0.0 - 2.0 mmol/L   Patient temperature 98.7 F     Collection site RADIAL, ALLEN'S TEST ACCEPTABLE     Drawn by RT     Sample type ARTERIAL    URINE RAPID DRUG SCREEN (HOSP PERFORMED)      Status: Abnormal   Collection Time    03/09/14  8:35 PM      Result Value Ref Range   Opiates NONE DETECTED  NONE DETECTED   Cocaine POSITIVE (*) NONE DETECTED   Benzodiazepines NONE DETECTED  NONE DETECTED   Amphetamines NONE DETECTED  NONE DETECTED   Tetrahydrocannabinol NONE DETECTED  NONE DETECTED   Barbiturates NONE DETECTED  NONE DETECTED   Comment:            DRUG SCREEN FOR MEDICAL PURPOSES     ONLY.  IF CONFIRMATION IS NEEDED     FOR ANY PURPOSE, NOTIFY LAB     WITHIN 5 DAYS.                LOWEST DETECTABLE LIMITS     FOR URINE DRUG SCREEN     Drug Class       Cutoff (ng/mL)     Amphetamine      1000     Barbiturate      200     Benzodiazepine   093     Tricyclics       267     Opiates          300     Cocaine          300     THC              50  CARBOXYHEMOGLOBIN     Status: Abnormal   Collection Time    03/09/14  9:56 PM      Result Value Ref Range   Total hemoglobin 15.4  13.5 - 18.0 g/dL   O2 Saturation 65.5     Carboxyhemoglobin 4.5 (*) 0.5 - 1.5 %   Methemoglobin 0.9  0.0 - 1.5 %  MRSA PCR SCREENING     Status: None   Collection Time    03/09/14 11:13 PM      Result Value Ref Range   MRSA by PCR NEGATIVE  NEGATIVE   Comment:            The GeneXpert MRSA Assay (FDA     approved for NASAL specimens     only), is one component of a     comprehensive MRSA colonization     surveillance program. It is not     intended to diagnose MRSA     infection nor to guide or     monitor treatment for     MRSA infections.  BLOOD GAS, ARTERIAL     Status: Abnormal   Collection Time    03/10/14  3:44 AM      Result Value  Ref Range   FIO2 1.00     Delivery systems NON-REBREATHER OXYGEN MASK     pH, Arterial 7.369  7.350 - 7.450   pCO2 arterial 42.6  35.0 - 45.0 mmHg   pO2, Arterial 375.0 (*) 80.0 - 100.0 mmHg   Bicarbonate 24.0  20.0 - 24.0 mEq/L   TCO2 25.3  0 - 100 mmol/L   Acid-base deficit 0.6  0.0 - 2.0 mmol/L   O2 Saturation 99.8     Patient  temperature 98.6     Collection site LEFT RADIAL     Drawn by [phone]     Sample type ARTERIAL DRAW     Allens test (pass/fail) PASS  PASS  CBC WITH DIFFERENTIAL     Status: Abnormal   Collection Time    03/10/14  4:40 AM      Result Value Ref Range   WBC 8.9  4.0 - 10.5 K/uL   RBC 4.54  4.22 - 5.81 MIL/uL   Hemoglobin 15.6  13.0 - 17.0 g/dL   HCT 45.2  39.0 - 52.0 %   MCV 99.6  78.0 - 100.0 fL   MCH 34.4 (*) 26.0 - 34.0 pg   MCHC 34.5  30.0 - 36.0 g/dL   RDW 13.8  11.5 - 15.5 %   Platelets 196  150 - 400 K/uL   Neutrophils Relative % 54  43 - 77 %   Neutro Abs 4.7  1.7 - 7.7 K/uL   Lymphocytes Relative 32  12 - 46 %   Lymphs Abs 2.8  0.7 - 4.0 K/uL   Monocytes Relative 12  3 - 12 %   Monocytes Absolute 1.1 (*) 0.1 - 1.0 K/uL   Eosinophils Relative 2  0 - 5 %   Eosinophils Absolute 0.2  0.0 - 0.7 K/uL   Basophils Relative 0  0 - 1 %   Basophils Absolute 0.0  0.0 - 0.1 K/uL  COMPREHENSIVE METABOLIC PANEL     Status: Abnormal   Collection Time    03/10/14  4:40 AM      Result Value Ref Range   Sodium 142  137 - 147 mEq/L   Potassium 4.7  3.7 - 5.3 mEq/L   Comment: DELTA CHECK NOTED     SLIGHT HEMOLYSIS   Chloride 105  96 - 112 mEq/L   CO2 25  19 - 32 mEq/L   Glucose, Bld 87  70 - 99 mg/dL   BUN 19  6 - 23 mg/dL   Creatinine, Ser 1.10  0.50 - 1.35 mg/dL   Calcium 8.5  8.4 - 10.5 mg/dL   Total Protein 6.4  6.0 - 8.3 g/dL   Albumin 3.3 (*) 3.5 - 5.2 g/dL   AST 34  0 - 37 U/L   Comment: SLIGHT HEMOLYSIS   ALT 35  0 - 53 U/L   Alkaline Phosphatase 49  39 - 117 U/L   Total Bilirubin 0.7  0.3 - 1.2 mg/dL   GFR calc non Af Amer 83 (*) >90 mL/min   GFR calc Af Amer >90  >90 mL/min   Comment: (NOTE)     The eGFR has been calculated using the CKD EPI equation.     This calculation has not been validated in all clinical situations.     eGFR's persistently <90 mL/min signify possible Chronic Kidney     Disease.  CARBOXYHEMOGLOBIN     Status: None   Collection Time    03/10/14   7:00 AM  Result Value Ref Range   Total hemoglobin 15.9  13.5 - 18.0 g/dL   O2 Saturation 65.1     Carboxyhemoglobin 1.5  0.5 - 1.5 %   Methemoglobin 0.9  0.0 - 1.5 %    Ct Head Wo Contrast  03/09/2014   CLINICAL DATA:  Confusion, seizure activity  EXAM: CT HEAD WITHOUT CONTRAST  TECHNIQUE: Contiguous axial images were obtained from the base of the skull through the vertex without contrast.  COMPARISON:  08/28/2013  FINDINGS: Stable mild brain atrophy for the patient's young age. No acute intracranial hemorrhage, mass lesion, infarction, midline shift, herniation, hydrocephalus, or extra-axial fluid collection. Normal gray-white matter differentiation. Cisterns patent. No cerebellar abnormality. Mastoids and sinuses clear.  IMPRESSION: Stable exam.  No acute intracranial process   Electronically Signed   By: Daryll Brod M.D.   On: 03/09/2014 20:09    Positive for bad mood, illegal drug usage and sleep disturbance Blood pressure 126/89, pulse 68, temperature 98 F (36.7 C), temperature source Oral, resp. rate 16, height $RemoveBe'5\' 8"'[id number]$  (1.727 m), weight 96.4 kg (212 lb 8.4 oz), SpO2 99.00%.   Assessment/Plan: Opiate dependence Cocaine abuse Substance induced mood disorder  Recommendation: Patient does not meet criteria for acute psychiatric hospitalization as he has no safety concerns Patient is willing to participate in [facility] for out patient substance abuse program Recommended no medication management Appreciate psych consult and sign off at this time  Neil Errickson,JANARDHAHA R. 03/10/2014, 4:18 PM

## 2014-08-02 ENCOUNTER — Emergency Department (HOSPITAL_COMMUNITY)
Admission: EM | Admit: 2014-08-02 | Discharge: 2014-08-02 | Disposition: A | Discharge status: Home / Self Care | Payer: Medicare Other | Attending: Emergency Medicine | Admitting: Emergency Medicine

## 2014-08-02 ENCOUNTER — Encounter (HOSPITAL_COMMUNITY): Payer: Self-pay | Admitting: Emergency Medicine

## 2014-08-02 DIAGNOSIS — K644 Residual hemorrhoidal skin tags: Secondary | ICD-10-CM | POA: Diagnosis not present

## 2014-08-02 DIAGNOSIS — Z8659 Personal history of other mental and behavioral disorders: Secondary | ICD-10-CM | POA: Diagnosis not present

## 2014-08-02 DIAGNOSIS — F17200 Nicotine dependence, unspecified, uncomplicated: Secondary | ICD-10-CM | POA: Diagnosis not present

## 2014-08-02 DIAGNOSIS — E876 Hypokalemia: Secondary | ICD-10-CM | POA: Insufficient documentation

## 2014-08-02 DIAGNOSIS — IMO0001 Reserved for inherently not codable concepts without codable children: Secondary | ICD-10-CM

## 2014-08-02 DIAGNOSIS — K649 Unspecified hemorrhoids: Secondary | ICD-10-CM

## 2014-08-02 DIAGNOSIS — G8929 Other chronic pain: Secondary | ICD-10-CM | POA: Insufficient documentation

## 2014-08-02 DIAGNOSIS — R030 Elevated blood-pressure reading, without diagnosis of hypertension: Secondary | ICD-10-CM | POA: Diagnosis not present

## 2014-08-02 DIAGNOSIS — Z87442 Personal history of urinary calculi: Secondary | ICD-10-CM | POA: Diagnosis not present

## 2014-08-02 LAB — I-STAT CHEM 8, ED
BUN: 11 mg/dL (ref 6–23)
Calcium, Ion: 1.1 mmol/L — ABNORMAL LOW (ref 1.12–1.23)
Chloride: 103 mEq/L (ref 96–112)
Creatinine, Ser: 1.3 mg/dL (ref 0.50–1.35)
Glucose, Bld: 89 mg/dL (ref 70–99)
HCT: 53 % — ABNORMAL HIGH (ref 39.0–52.0)
Hemoglobin: 18 g/dL — ABNORMAL HIGH (ref 13.0–17.0)
Potassium: 3.3 mEq/L — ABNORMAL LOW (ref 3.7–5.3)
Sodium: 140 mEq/L (ref 137–147)
TCO2: 23 mmol/L (ref 0–100)

## 2014-08-02 MED ORDER — OXYCODONE-ACETAMINOPHEN 5-325 MG PO TABS
1.0000 | ORAL_TABLET | Freq: Once | ORAL | Status: AC
  Administered 2014-08-02: 1 via ORAL
  Filled 2014-08-02: qty 1

## 2014-08-02 MED ORDER — HYDROCORTISONE 1 % EX CREA: 1.0000 "application " | TOPICAL_CREAM | Freq: Two times a day (BID) | CUTANEOUS | Status: DC

## 2014-08-02 NOTE — ED Notes (Signed)
Pt reports hemorrhoid that is bleeding x 1 month. Past week pain is worse. Pt is a x 4.

## 2014-08-02 NOTE — ED Provider Notes (Signed)
CSN: [id number]     Arrival date & time 08/02/14  1801 History   First MD Initiated Contact with Patient 08/02/14 2148     Chief Complaint  Patient presents with  . Hemorrhoids     (Consider location/radiation/quality/duration/timing/severity/associated sxs/prior Treatment) HPI Pt is a 38yo male with hx of chronic back pain, kidney stones, ADD, and methadone use, presenting to ED with c/o a hemorrhoid that has been bleeding when he wipes over the last 3-4 weeks, gradually worsening in discomfort over last 3 days. Pt states pain is worse during a bowel movement, 6/10 at worse, sharp and aching.  Pt states it feels like a hard mass in his rectum, states he was never diagnosed with hemorrhoids. Denies use of OTC medications for symptoms. Denies hx of HTN. Denies fever, chills, n/v/d. Denies abdominal pain or urinary symptoms.   Past Medical History  Diagnosis Date  . Chronic back pain   . Kidney stones   . Kidney stones   . ADD (attention deficit disorder)   . Methadone use    Past Surgical History  Procedure Laterality Date  . Back surgery    . Hand surgery    . Hip surgery     Family History  Problem Relation Age of Onset  . Diabetes Other   . Stroke Other    History  Substance Use Topics  . Smoking status: Current Every Day Smoker -- 0.50 packs/day  . Smokeless tobacco: Not on file  . Alcohol Use: Yes    Review of Systems  Constitutional: Negative for fever and chills.  Respiratory: Negative for cough and shortness of breath.   Cardiovascular: Negative for chest pain and palpitations.  Gastrointestinal: Positive for anal bleeding ( when pt wipes ). Negative for nausea, vomiting, abdominal pain, diarrhea and blood in stool.  Genitourinary: Negative for dysuria, urgency, frequency, hematuria, flank pain, decreased urine volume, discharge, penile swelling, penile pain and testicular pain.  Musculoskeletal: Negative for back pain and myalgias.  All other systems reviewed and  are negative.     Allergies  Reglan and Toradol  Home Medications   Prior to Admission medications   Medication Sig Start Date End Date Taking? Authorizing Provider  Aspirin-Salicylamide-Caffeine (BC HEADACHE POWDER PO) Take 1 packet by mouth daily as needed (for pain).   Yes Historical Provider, MD  ibuprofen (ADVIL,MOTRIN) 200 MG tablet Take 400-800 mg by mouth every 6 (six) hours as needed for headache or moderate pain.   Yes Historical Provider, MD  hydrocortisone cream 1 % Apply 1 application topically 2 (two) times daily. Apply to affected area twice daily 08/02/14   Junius Finner, PA-C   BP 144/82  Pulse 93  Temp(Src) 99.2 F (37.3 C) (Oral)  Resp 18  SpO2 99% Physical Exam  Nursing note and vitals reviewed. Constitutional: He appears well-developed and well-nourished.  HENT:  Head: Normocephalic and atraumatic.  Eyes: Conjunctivae are normal. No scleral icterus.  Neck: Normal range of motion.  Cardiovascular: Normal rate, regular rhythm and normal heart sounds.   Pulmonary/Chest: Effort normal and breath sounds normal. No respiratory distress. He has no wheezes. He has no rales. He exhibits no tenderness.  Abdominal: Soft. Bowel sounds are normal. He exhibits no distension and no mass. There is no tenderness. There is no rebound and no guarding.  Genitourinary:  Chaperoned exam. Rectal exam: mild erythema around rectum, no active bleeding. 1cm linear area of induration and tenderness next to rectum consistent with hemorrhoid  Musculoskeletal: Normal range of motion.  Neurological: He is alert.  Skin: Skin is warm and dry.    ED Course  Procedures (including critical care time) Labs Review Labs Reviewed  I-STAT CHEM 8, ED - Abnormal; Notable for the following:    Potassium 3.3 (*)    Calcium, Ion 1.10 (*)    Hemoglobin 18.0 (*)    HCT 53.0 (*)    All other components within normal limits    Imaging Review No results found.   EKG Interpretation None       MDM   Final diagnoses:  Hemorrhoids, unspecified hemorrhoid type  Elevated blood pressure  Hypokalemia    Pt is a 38yo male presenting to ED with c/o of symptoms consistent with hemorrhoids. Pt also found to have elevated HR and BP w/o previous hx of same. Will get chem-8 to check renal function.  No other symptoms or complaints.   Chem-8: mild hypokalemia at 3.3, k-dur given in ED.   BP was initially evelvated in ED but went down steadily while in ED.   Rectal exam, consistent with external hemorrhoid w/o active bleeding.   Will discharge home with preparation H and advised to f/u with PCP for further evaluation and treatment of hemorrhoids as well as recheck of elevated BP.  Advised he may also f/u with [facility] if symptoms not improving but encouraged to establish PCP first. Return precautions provided. Pt verbalized understanding and agreement with tx plan.     Junius Finner, PA-C 08/02/14 2323

## 2014-08-02 NOTE — ED Notes (Signed)
Pt monitored by pulse ox, bp cuff, and 5-lead. 

## 2014-08-03 NOTE — ED Provider Notes (Signed)
Medical screening examination/treatment/procedure(s) were performed by non-physician practitioner and as supervising physician I was immediately available for consultation/collaboration.   EKG Interpretation None       Glynn Octave, MD 08/03/14 [phone]

## 2014-08-13 ENCOUNTER — Encounter (HOSPITAL_COMMUNITY): Payer: Self-pay | Admitting: Emergency Medicine

## 2014-08-13 ENCOUNTER — Observation Stay (HOSPITAL_COMMUNITY)
Admission: EM | Admit: 2014-08-13 | Discharge: 2014-08-15 | Disposition: A | Discharge status: Home / Self Care | Payer: Medicare Other | Attending: Family Medicine | Admitting: Family Medicine

## 2014-08-13 ENCOUNTER — Emergency Department (INDEPENDENT_AMBULATORY_CARE_PROVIDER_SITE_OTHER)
Admission: EM | Admit: 2014-08-13 | Discharge: 2014-08-13 | Disposition: A | Discharge status: Nursing Home (Includes ICF) | Payer: Medicare Other | Source: Home / Self Care | Attending: Family Medicine | Admitting: Family Medicine

## 2014-08-13 ENCOUNTER — Emergency Department (HOSPITAL_COMMUNITY): Payer: Medicare Other

## 2014-08-13 DIAGNOSIS — D72829 Elevated white blood cell count, unspecified: Secondary | ICD-10-CM | POA: Diagnosis present

## 2014-08-13 DIAGNOSIS — F988 Other specified behavioral and emotional disorders with onset usually occurring in childhood and adolescence: Secondary | ICD-10-CM | POA: Diagnosis not present

## 2014-08-13 DIAGNOSIS — R079 Chest pain, unspecified: Secondary | ICD-10-CM

## 2014-08-13 DIAGNOSIS — R61 Generalized hyperhidrosis: Secondary | ICD-10-CM | POA: Insufficient documentation

## 2014-08-13 DIAGNOSIS — Z87442 Personal history of urinary calculi: Secondary | ICD-10-CM | POA: Insufficient documentation

## 2014-08-13 DIAGNOSIS — G8929 Other chronic pain: Secondary | ICD-10-CM | POA: Diagnosis not present

## 2014-08-13 DIAGNOSIS — R110 Nausea: Secondary | ICD-10-CM | POA: Insufficient documentation

## 2014-08-13 DIAGNOSIS — R072 Precordial pain: Secondary | ICD-10-CM | POA: Diagnosis not present

## 2014-08-13 DIAGNOSIS — N200 Calculus of kidney: Secondary | ICD-10-CM

## 2014-08-13 DIAGNOSIS — IMO0002 Reserved for concepts with insufficient information to code with codable children: Secondary | ICD-10-CM | POA: Insufficient documentation

## 2014-08-13 DIAGNOSIS — R002 Palpitations: Secondary | ICD-10-CM | POA: Insufficient documentation

## 2014-08-13 DIAGNOSIS — F17200 Nicotine dependence, unspecified, uncomplicated: Secondary | ICD-10-CM | POA: Diagnosis present

## 2014-08-13 DIAGNOSIS — I160 Hypertensive urgency: Secondary | ICD-10-CM | POA: Diagnosis present

## 2014-08-13 DIAGNOSIS — R0602 Shortness of breath: Secondary | ICD-10-CM | POA: Insufficient documentation

## 2014-08-13 DIAGNOSIS — F314 Bipolar disorder, current episode depressed, severe, without psychotic features: Secondary | ICD-10-CM

## 2014-08-13 DIAGNOSIS — N289 Disorder of kidney and ureter, unspecified: Secondary | ICD-10-CM

## 2014-08-13 LAB — CBC
HCT: 49.5 % (ref 39.0–52.0)
Hemoglobin: 17.4 g/dL — ABNORMAL HIGH (ref 13.0–17.0)
MCH: 34 pg (ref 26.0–34.0)
MCHC: 35.2 g/dL (ref 30.0–36.0)
MCV: 96.7 fL (ref 78.0–100.0)
Platelets: 246 10*3/uL (ref 150–400)
RBC: 5.12 MIL/uL (ref 4.22–5.81)
RDW: 13.7 % (ref 11.5–15.5)
WBC: 12.1 10*3/uL — ABNORMAL HIGH (ref 4.0–10.5)

## 2014-08-13 LAB — BASIC METABOLIC PANEL
Anion gap: 14 (ref 5–15)
BUN: 23 mg/dL (ref 6–23)
CO2: 24 mEq/L (ref 19–32)
Calcium: 9.1 mg/dL (ref 8.4–10.5)
Chloride: 100 mEq/L (ref 96–112)
Creatinine, Ser: 1.63 mg/dL — ABNORMAL HIGH (ref 0.50–1.35)
GFR, EST AFRICAN AMERICAN: 60 mL/min — AB (ref >90)
GFR, EST NON AFRICAN AMERICAN: 52 mL/min — AB (ref >90)
Glucose, Bld: 114 mg/dL — ABNORMAL HIGH (ref 70–99)
Potassium: 3.5 mEq/L — ABNORMAL LOW (ref 3.7–5.3)
Sodium: 138 mEq/L (ref 137–147)

## 2014-08-13 LAB — I-STAT TROPONIN, ED: Troponin i, poc: 0 ng/mL (ref 0.00–0.08)

## 2014-08-13 MED ORDER — ACETAMINOPHEN 325 MG PO TABS
650.0000 mg | ORAL_TABLET | Freq: Once | ORAL | Status: AC
  Administered 2014-08-13: 650 mg via ORAL
  Filled 2014-08-13: qty 2

## 2014-08-13 MED ORDER — NITROGLYCERIN 0.4 MG SL SUBL: 0.4000 mg | SUBLINGUAL_TABLET | SUBLINGUAL | Status: DC | PRN

## 2014-08-13 MED ORDER — SODIUM CHLORIDE 0.9 % IV SOLN: Freq: Once | INTRAVENOUS | Status: DC

## 2014-08-13 NOTE — ED Provider Notes (Signed)
Jonathan Perez is a 38 y.o. male who presents to Urgent Care today for left-sided chest pain. Patient describes crushing left-sided chest pain. This is associated with exertional shortness of breath. The pain is severe and radiates to his left arm and is associated with palpitations. The pain itself is not worsened by exertion. The patient is been present for the last 3-4 days. He notes a bit of dizziness and lightheadedness as well. He has not tried any medications for this yet. No fevers or chills nausea vomiting or diarrhea.   Past Medical History  Diagnosis Date  . Chronic back pain   . Kidney stones   . Kidney stones   . ADD (attention deficit disorder)   . Methadone use    History  Substance Use Topics  . Smoking status: Current Every Day Smoker -- 0.50 packs/day  . Smokeless tobacco: Not on file  . Alcohol Use: Yes   ROS as above Medications: Current Facility-Administered Medications  Medication Dose Route Frequency Provider Last Rate Last Dose  . 0.9 %  sodium chloride infusion   Intravenous Once Rodolph Bong, MD      . nitroGLYCERIN (NITROSTAT) SL tablet 0.4 mg  0.4 mg Sublingual Q5 min PRN Rodolph Bong, MD       Current Outpatient Prescriptions  Medication Sig Dispense Refill  . Aspirin-Salicylamide-Caffeine (BC HEADACHE POWDER PO) Take 1 packet by mouth daily as needed (for pain).      . hydrocortisone cream 1 % Apply 1 application topically 2 (two) times daily. Apply to affected area twice daily  30 g  0  . ibuprofen (ADVIL,MOTRIN) 200 MG tablet Take 400-800 mg by mouth every 6 (six) hours as needed for headache or moderate pain.        Exam:  BP 171/110  Pulse 120  Resp 20  SpO2 96% Gen: Well NAD HEENT: EOMI,  MMM Lungs: Normal work of breathing. CTABL Heart: Tachycardia but regular no MRG Abd: NABS, Soft. Nondistended, Nontender Exts: Brisk capillary refill, warm and well perfused.   Twelve-lead EKG was normal sinus rhythm at 85 beats per minute. No ST  segment elevation or depression. Small Q waves in leads II, V4, V5 and V6.  No results found for this or any previous visit (from the past 24 hour(s)). No results found.  Assessment and Plan: 38 y.o. male with left-sided chest pain associated with significantly elevated blood pressure. Concerning for acute coronary syndrome. Patient was given 0.4 mg of sublingual nitroglycerin IV fluids oxygen therapy and transfered to the emergency department via EMS.  Discussed warning signs or symptoms. Please see discharge instructions. Patient expresses understanding.   This note was created using Conservation officer, historic buildings. Any transcription errors are unintended.    Rodolph Bong, MD 08/13/14 [phone]

## 2014-08-13 NOTE — ED Notes (Signed)
Called to [facility], EMS en route to transport patient

## 2014-08-13 NOTE — ED Provider Notes (Signed)
I saw and evaluated the patient, reviewed the resident's note and I agree with the findings and plan.   EKG Interpretation None        Rate: 78   Rhythm: normal sinus rhythm  QRS Axis: normal  Intervals: normal  ST/T Wave abnormalities: normal  Conduction Disutrbances:none  Narrative Interpretation:   Old EKG Reviewed: none available  Patient here with exertional chest pain 3 days. With associated diaphoresis dyspnea. Seen at urgent care center given nitroglycerin and symptoms improved. His EKG does not show any evidence of ACS. Will be admitted to the hospitalist service  Toy Baker, MD 08/13/14 2330

## 2014-08-13 NOTE — ED Notes (Signed)
Sinus tach

## 2014-08-13 NOTE — ED Notes (Signed)
Pt transported to radiology.

## 2014-08-13 NOTE — ED Notes (Signed)
C/o feeling fatigue for about a week.  Intermittent "gripping, clutching" sensation below left breast.  Associated with fast heartbeat, sob.

## 2014-08-13 NOTE — ED Provider Notes (Signed)
CSN: [id number]     Arrival date & time 08/13/14  2026 History   First MD Initiated Contact with Patient 08/13/14 2042     Chief Complaint  Patient presents with  . Chest Pain     (Consider location/radiation/quality/duration/timing/severity/associated sxs/prior Treatment) Patient is a 38 y.o. male presenting with chest pain. The history is provided by the patient.  Chest Pain Pain location:  L chest Pain quality: pressure and sharp   Pain radiates to:  Does not radiate Pain radiates to the back: no   Pain severity:  Severe Onset quality:  Gradual Duration:  3 days Timing:  Intermittent Progression:  Worsening Chronicity:  New Context comment:  Uknown Relieved by:  Nitroglycerin and aspirin Worsened by:  Certain positions [employer] on my left side") Ineffective treatments:  None tried Associated symptoms: nausea, palpitations and shortness of breath   Associated symptoms: no abdominal pain, no altered mental status, no anorexia, no dizziness, no dysphagia, no fatigue, no fever, no headache, no lower extremity edema, no near-syncope, no PND and no weakness     Past Medical History  Diagnosis Date  . Chronic back pain   . Kidney stones   . Kidney stones   . ADD (attention deficit disorder)   . Methadone use    Past Surgical History  Procedure Laterality Date  . Back surgery    . Hand surgery    . Hip surgery     Family History  Problem Relation Age of Onset  . Diabetes Other   . Stroke Other    History  Substance Use Topics  . Smoking status: Current Every Day Smoker -- 0.50 packs/day  . Smokeless tobacco: Not on file  . Alcohol Use: Yes    Review of Systems  Constitutional: Negative for fever and fatigue.  HENT: Negative for trouble swallowing.   Respiratory: Positive for shortness of breath.   Cardiovascular: Positive for chest pain and palpitations. Negative for PND and near-syncope.  Gastrointestinal: Positive for nausea. Negative for abdominal pain and  anorexia.  Neurological: Negative for dizziness, weakness and headaches.  All other systems reviewed and are negative.     Allergies  Reglan and Toradol  Home Medications   Prior to Admission medications   Medication Sig Start Date End Date Taking? Authorizing Provider  Aspirin-Salicylamide-Caffeine (BC HEADACHE POWDER PO) Take 1 packet by mouth daily as needed (for pain).   Yes Historical Provider, MD  hydrocortisone cream 1 % Apply 1 application topically 2 (two) times daily. Apply to affected area twice daily 08/02/14  Yes Junius Finner, PA-C  ibuprofen (ADVIL,MOTRIN) 200 MG tablet Take 400-800 mg by mouth every 6 (six) hours as needed for headache or moderate pain.   Yes Historical Provider, MD   BP 118/75  Pulse 79  Temp(Src) 98.3 F (36.8 C) (Oral)  Resp 17  Ht  (1.727 m)  Wt 215 lb (97.523 kg)  BMI 32.70 kg/m2  SpO2 96% Physical Exam  Nursing note and vitals reviewed. Constitutional: He is oriented to person, place, and time. He appears well-developed and well-nourished. No distress.  HENT:  Head: Normocephalic and atraumatic.  Eyes: Conjunctivae and EOM are normal. Right eye exhibits no discharge. Left eye exhibits no discharge.  Neck: Normal range of motion. Neck supple. No tracheal deviation present.  Cardiovascular: Normal rate, regular rhythm and normal heart sounds.  Exam reveals no friction rub.   No murmur heard. Pulmonary/Chest: Effort normal and breath sounds normal. No stridor. No respiratory distress. He has  no wheezes. He has no rales. He exhibits no tenderness.  Abdominal: Soft. He exhibits no distension. There is no tenderness. There is no rebound and no guarding.  Neurological: He is alert and oriented to person, place, and time.  Skin: Skin is warm.  Psychiatric: He has a normal mood and affect.    ED Course  Procedures (including critical care time) Labs Review Labs Reviewed  CBC - Abnormal; Notable for the following:    WBC 12.1 (*)     Hemoglobin 17.4 (*)    All other components within normal limits  BASIC METABOLIC PANEL - Abnormal; Notable for the following:    Potassium 3.5 (*)    Glucose, Bld 114 (*)    Creatinine, Ser 1.63 (*)    GFR calc non Af Amer 52 (*)    GFR calc Af Amer 60 (*)    All other components within normal limits  Rosezena Sensor, ED    Imaging Review Dg Chest 2 View  08/13/2014   CLINICAL DATA:  Sharp chest pain on the left side. Tachycardia for 5 days.  EXAM: CHEST  2 VIEW  COMPARISON:  08/1913  FINDINGS: Normal heart size and pulmonary vascularity. No focal airspace disease or consolidation in the lungs. No blunting of costophrenic angles. No pneumothorax. Mediastinal contours appear intact. Postoperative changes in the thoracic spine with posterior rod and screw fixation.  IMPRESSION: No active cardiopulmonary disease.   Electronically Signed   By: Burman Nieves M.D.   On: 08/13/2014 22:26     EKG Interpretation   Date/Time:  Friday August 13 2014 20:32:40 EDT Ventricular Rate:  94 PR Interval:  111 QRS Duration: 97 QT Interval:  361 QTC Calculation: 451 R Axis:   67 Text Interpretation:  Sinus rhythm Borderline short PR interval Consider  right atrial enlargement Abnormal T, consider ischemia, lateral leads No  significant change since last tracing Confirmed by ALLEN  MD, ANTHONY  ([id number]) on 08/13/2014 11:28:57 PM      MDM   Final diagnoses:  None    Pt presents as a transfer from an [facility] for concern of ACS. He has substernal chest pain as noted in the hx. Noted with exertion at work as well. + diaphoresis as well. HTN at the [facility] 170/110. Patient reports BP of 215/110 with EMS. He was given NTG and ASA with EMS and his BP improved pain nearly resolved. No history of CAD. Pain was typical. Slightly hypertensive here now. O2 sat wnls on room air. ECG neg for ischemic changes. CXR negf or PNA, PTX or mediastinal widening. Trop neg. Cr is elevated at 1.63, which could represent end  organ damage from HTN. Features are concerning for ACS vs hypertensive urgency. Admission indicated for rule out. Patient was admitted to the hospitalist. He has a headache since the NTG was given. 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 Patient was given tylenol.     Sena Hitch, MD 08/13/14 [phone]

## 2014-08-13 NOTE — ED Notes (Signed)
Per ems-- pt from [facility] with reports of L sided chest cramping since Monday. 2 nitro administered PTA. Pt conscious alert & oriented x4.

## 2014-08-13 NOTE — ED Notes (Signed)
[employer], rn reports she talked to karen, rn and has notified [facility] for transport to ed

## 2014-08-14 DIAGNOSIS — D72829 Elevated white blood cell count, unspecified: Secondary | ICD-10-CM | POA: Diagnosis present

## 2014-08-14 DIAGNOSIS — I160 Hypertensive urgency: Secondary | ICD-10-CM | POA: Diagnosis present

## 2014-08-14 DIAGNOSIS — N289 Disorder of kidney and ureter, unspecified: Secondary | ICD-10-CM

## 2014-08-14 DIAGNOSIS — F17200 Nicotine dependence, unspecified, uncomplicated: Secondary | ICD-10-CM | POA: Diagnosis present

## 2014-08-14 DIAGNOSIS — I517 Cardiomegaly: Secondary | ICD-10-CM

## 2014-08-14 DIAGNOSIS — R079 Chest pain, unspecified: Secondary | ICD-10-CM

## 2014-08-14 DIAGNOSIS — I10 Essential (primary) hypertension: Secondary | ICD-10-CM

## 2014-08-14 LAB — RAPID URINE DRUG SCREEN, HOSP PERFORMED
AMPHETAMINES: NOT DETECTED
Barbiturates: NOT DETECTED
Benzodiazepines: NOT DETECTED
Cocaine: POSITIVE — AB
Opiates: NOT DETECTED
TETRAHYDROCANNABINOL: NOT DETECTED

## 2014-08-14 LAB — CBC
HCT: 48.6 % (ref 39.0–52.0)
HEMOGLOBIN: 16.5 g/dL (ref 13.0–17.0)
MCH: 33.1 pg (ref 26.0–34.0)
MCHC: 34 g/dL (ref 30.0–36.0)
MCV: 97.6 fL (ref 78.0–100.0)
Platelets: 224 10*3/uL (ref 150–400)
RBC: 4.98 MIL/uL (ref 4.22–5.81)
RDW: 14 % (ref 11.5–15.5)
WBC: 11.7 10*3/uL — ABNORMAL HIGH (ref 4.0–10.5)

## 2014-08-14 LAB — BASIC METABOLIC PANEL
ANION GAP: 13 (ref 5–15)
BUN: 24 mg/dL — ABNORMAL HIGH (ref 6–23)
CALCIUM: 8.8 mg/dL (ref 8.4–10.5)
CHLORIDE: 101 meq/L (ref 96–112)
CO2: 26 mEq/L (ref 19–32)
CREATININE: 1.93 mg/dL — AB (ref 0.50–1.35)
GFR calc non Af Amer: 42 mL/min — ABNORMAL LOW (ref >90)
GFR, EST AFRICAN AMERICAN: 48 mL/min — AB (ref >90)
Glucose, Bld: 109 mg/dL — ABNORMAL HIGH (ref 70–99)
Potassium: 3 mEq/L — ABNORMAL LOW (ref 3.7–5.3)
Sodium: 140 mEq/L (ref 137–147)

## 2014-08-14 LAB — TROPONIN I
Troponin I: <0.3 ng/mL (ref <0.30)
Troponin I: <0.3 ng/mL (ref <0.30)

## 2014-08-14 MED ORDER — SODIUM CHLORIDE 0.9 % IV SOLN: 250.0000 mL | INTRAVENOUS | Status: DC | PRN

## 2014-08-14 MED ORDER — ALUM & MAG HYDROXIDE-SIMETH 200-200-20 MG/5ML PO SUSP: 30.0000 mL | Freq: Four times a day (QID) | ORAL | Status: DC | PRN

## 2014-08-14 MED ORDER — ENOXAPARIN SODIUM 40 MG/0.4ML ~~LOC~~ SOLN
40.0000 mg | Freq: Every day | SUBCUTANEOUS | Status: DC
  Administered 2014-08-14 – 2014-08-15 (×2): 40 mg via SUBCUTANEOUS
  Filled 2014-08-14 (×2): qty 0.4

## 2014-08-14 MED ORDER — ACETAMINOPHEN 650 MG RE SUPP: 650.0000 mg | Freq: Four times a day (QID) | RECTAL | Status: DC | PRN

## 2014-08-14 MED ORDER — ACETAMINOPHEN 325 MG PO TABS
650.0000 mg | ORAL_TABLET | Freq: Four times a day (QID) | ORAL | Status: DC | PRN
  Administered 2014-08-15 (×2): 650 mg via ORAL
  Filled 2014-08-14 (×2): qty 2

## 2014-08-14 MED ORDER — HYDROMORPHONE HCL PF 1 MG/ML IJ SOLN: 0.5000 mg | INTRAMUSCULAR | Status: DC | PRN

## 2014-08-14 MED ORDER — ACETAMINOPHEN 325 MG PO TABS
650.0000 mg | ORAL_TABLET | Freq: Four times a day (QID) | ORAL | Status: DC | PRN
  Administered 2014-08-14 (×2): 650 mg via ORAL
  Filled 2014-08-14 (×2): qty 2

## 2014-08-14 MED ORDER — ACETAMINOPHEN 650 MG RE SUPP: 650.0000 mg | RECTAL | Status: DC | PRN

## 2014-08-14 MED ORDER — POTASSIUM CHLORIDE CRYS ER 20 MEQ PO TBCR
40.0000 meq | EXTENDED_RELEASE_TABLET | Freq: Once | ORAL | Status: AC
  Administered 2014-08-14: 40 meq via ORAL
  Filled 2014-08-14: qty 2

## 2014-08-14 MED ORDER — SODIUM CHLORIDE 0.9 % IJ SOLN: 3.0000 mL | INTRAMUSCULAR | Status: DC | PRN

## 2014-08-14 MED ORDER — OXYCODONE HCL 5 MG PO TABS
5.0000 mg | ORAL_TABLET | ORAL | Status: DC | PRN
  Administered 2014-08-14 – 2014-08-15 (×7): 5 mg via ORAL
  Filled 2014-08-14 (×7): qty 1

## 2014-08-14 MED ORDER — ONDANSETRON HCL 4 MG PO TABS
4.0000 mg | ORAL_TABLET | Freq: Four times a day (QID) | ORAL | Status: DC | PRN
Start: 2014-08-14 — End: 2014-08-15

## 2014-08-14 MED ORDER — HYDRALAZINE HCL 20 MG/ML IJ SOLN
10.0000 mg | Freq: Four times a day (QID) | INTRAMUSCULAR | Status: DC | PRN
Start: 2014-08-14 — End: 2014-08-15

## 2014-08-14 MED ORDER — ONDANSETRON HCL 4 MG/2ML IJ SOLN
4.0000 mg | Freq: Four times a day (QID) | INTRAMUSCULAR | Status: DC | PRN
  Administered 2014-08-14: 4 mg via INTRAVENOUS
  Filled 2014-08-14: qty 2

## 2014-08-14 MED ORDER — ACETAMINOPHEN 325 MG PO TABS: 650.0000 mg | ORAL_TABLET | ORAL | Status: DC | PRN

## 2014-08-14 MED ORDER — SODIUM CHLORIDE 0.9 % IJ SOLN
3.0000 mL | Freq: Two times a day (BID) | INTRAMUSCULAR | Status: DC
  Administered 2014-08-14 – 2014-08-15 (×4): 3 mL via INTRAVENOUS

## 2014-08-14 MED ORDER — NITROGLYCERIN 2 % TD OINT
1.0000 [in_us] | TOPICAL_OINTMENT | Freq: Four times a day (QID) | TRANSDERMAL | Status: DC
  Administered 2014-08-14: 1 [in_us] via TOPICAL
  Filled 2014-08-14: qty 30

## 2014-08-14 NOTE — Progress Notes (Signed)
Patient seen and evaluated earlier this am by my associate. Please refer to his H and P for details regarding assessment and plan.  Will reassess next am.  Jonathan Perez  

## 2014-08-14 NOTE — H&P (Signed)
[facility] Admission History and Physical       Mahmud B Feliz RUE:[id number] DOBABDIEL BLACKERBY5 DOA: 08/13/2014  Referring physician:  EDP PCP: No PCP Per Patient  Specialists:   Chief Complaint:  Chest Pain  HPI: JOSHIAH TRAYNHAM is a 38 y.o. male who presents to the ED with complaints of substernal area chest pain described as squeezing sharp chest pain off and on x 4 days with palpitations.   His pain was associated with SOB, nausea, and vomiting and diaphoresis.  He went to an area [facility] for his symptoms today and was sent to the ED due to his symptoms and elevated blood pressure of 170/110.  He was  Transferred to the ED and was found to have a blood pressure of 220/115 and was given SL NTG and had improvement in his chest pain and his blood pressure.  He was further evaluated in the ED and had a negative initial cardiac workup and was referred for medical admission.     Review of Systems:  Constitutional: No Weight Loss, No Weight Gain, Night Sweats, Fevers, Chills, Dizziness, Fatigue, or Generalized Weakness HEENT: No Headaches, Difficulty Swallowing,Tooth/Dental Problems,Sore Throat,  No Sneezing, Rhinitis, Ear Ache, Nasal Congestion, or Post Nasal Drip,  Cardio-vascular:   +Chest pain, Orthopnea, PND, Edema in Lower Extremities, Anasarca, Dizziness, Palpitations  Resp: +Dyspnea, No DOE, No Cough, No Hemoptysis, No Wheezing.    GI: No Heartburn, Indigestion, Abdominal Pain, +Nausea, +Vomiting, Diarrhea, Hematemesis, Hematochezia, Melena, Change in Bowel Habits,  Loss of Appetite  GU: No Dysuria, Change in Color of Urine, No Urgency or Frequency, No Flank pain.  Musculoskeletal: No Joint Pain or Swelling, No Decreased Range of Motion, +Back Pain.  Neurologic: No Syncope, No Seizures, Muscle Weakness, Paresthesia, Vision Disturbance or Loss, No Diplopia, No Vertigo, No Difficulty Walking,  Skin: No Rash or Lesions. Psych: No Change in Mood or Affect, No Depression or Anxiety, No  Memory loss, No Confusion, or Hallucinations   Past Medical History  Diagnosis Date  . Chronic back pain   . Kidney stones   . Kidney stones   . ADD (attention deficit disorder)   . Methadone use       Past Surgical History  Procedure Laterality Date  . Back surgery    . Hand surgery    . Hip surgery         Prior to Admission medications   Medication Sig Start Date End Date Taking? Authorizing Provider  Aspirin-Salicylamide-Caffeine (BC HEADACHE POWDER PO) Take 1 packet by mouth daily as needed (for pain).   Yes Historical Provider, MD  hydrocortisone cream 1 % Apply 1 application topically 2 (two) times daily. Apply to affected area twice daily 08/02/14  Yes Junius Finner, PA-C  ibuprofen (ADVIL,MOTRIN) 200 MG tablet Take 400-800 mg by mouth every 6 (six) hours as needed for headache or moderate pain.   Yes Historical Provider, MD     Allergies  Allergen Reactions  . Reglan [Metoclopramide] Itching  . Toradol [Ketorolac Tromethamine] Itching     Social History:  reports that he has been smoking.  He does not have any smokeless tobacco history on file. He reports that he drinks alcohol. He reports that he does not use illicit drugs.     Family History  Problem Relation Age of Onset  . Diabetes Other   . Stroke Other        Physical Exam:  GEN:  Pleasant Obese 38 y.o. Caucasian male examined  and in  no acute distress; cooperative with exam Filed Vitals:   08/13/14 2145 08/13/14 2245 08/13/14 2315 08/14/14 0121  BP: 147/73 138/71 118/75 149/95  Pulse: 81 82 79 80  Temp:    98.4 F (36.9 C)  TempSrc:    Oral  Resp: Height:     (1.727 m)  Weight:      SpO2: 91% 94% 96% 99%   Blood pressure 149/95, pulse 80, temperature 98.4 F (36.9 C), temperature source Oral, resp. rate 18, height  (1.727 m), weight 97.523 kg (215 lb), SpO2 99.00%. PSYCH: He is alert and oriented x4; does not appear anxious does not appear depressed; affect is  normal HEENT: Normocephalic and Atraumatic, Mucous membranes pink; PERRLA; EOM intact; Fundi:  Benign;  No scleral icterus, Nares: Patent, Oropharynx: Clear, Poor Dentition,    Neck:  FROM, No Cervical Lymphadenopathy nor Thyromegaly or Carotid Bruit; No JVD; Breasts:: Not examined CHEST WALL: No tenderness CHEST: Normal respiration, clear to auscultation bilaterally HEART: Regular rate and rhythm; no murmurs rubs or gallops BACK: No kyphosis or scoliosis; No CVA tenderness ABDOMEN: Positive Bowel Sounds,  Obese, Soft Non-Tender; No Masses, No Organomegaly, No Pannus; No Intertriginous candida. Rectal Exam: Not done EXTREMITIES: No Cyanosis, Clubbing, or Edema; No Ulcerations. Genitalia: not examined PULSES: 2+ and symmetric SKIN: Normal hydration no rash or ulceration CNS:  Alert X Oriented x 4, Non Focal Vascular: pulses palpable throughout    Labs on Admission:  Basic Metabolic Panel:  Recent Labs Lab 08/13/14 2120  NA 138  K 3.5*  CL 100  CO2 24  GLUCOSE 114*  BUN 23  CREATININE 1.63*  CALCIUM 9.1   Liver Function Tests: No results found for this basename: AST, ALT, ALKPHOS, BILITOT, PROT, ALBUMIN,  in the last 168 hours No results found for this basename: LIPASE, AMYLASE,  in the last 168 hours No results found for this basename: AMMONIA,  in the last 168 hours CBC:  Recent Labs Lab 08/13/14 2120  WBC 12.1*  HGB 17.4*  HCT 49.5  MCV 96.7  PLT 246   Cardiac Enzymes: No results found for this basename: CKTOTAL, CKMB, CKMBINDEX, TROPONINI,  in the last 168 hours  BNP (last 3 results) No results found for this basename: PROBNP,  in the last 8760 hours CBG: No results found for this basename: GLUCAP,  in the last 168 hours  Radiological Exams on Admission: Dg Chest 2 View  08/13/2014   CLINICAL DATA:  Sharp chest pain on the left side. Tachycardia for 5 days.  EXAM: CHEST  2 VIEW  COMPARISON:  08/1913  FINDINGS: Normal heart size and pulmonary vascularity. No  focal airspace disease or consolidation in the lungs. No blunting of costophrenic angles. No pneumothorax. Mediastinal contours appear intact. Postoperative changes in the thoracic spine with posterior rod and screw fixation.  IMPRESSION: No active cardiopulmonary disease.   Electronically Signed   By: Burman Nieves M.D.   On: 08/13/2014 22:26     EKG: Independently reviewed. Normal Sinus Rhythm at rate of 94   Assessment/Plan:   38 y.o. male with  Principal Problem:   1.   Chest pain   Telemetry Monitoring   Cycle Troponins  ` Nitropaste O2, ASA   2D ECHO In AM  Active Problems:   2.   Hypertensive urgency   PRN IV Hydralazine   Monitor BPs      3.   Renal insufficiency   Monitor BUN/Cr  4.   Leukocytosis, unspecified- Probably Reactive   Monitor  Trend      5.   Tobacco use disorder   Nicotine patch daily    6.    DVT Prophylaxis   Lovenox     Code Status:   FULL CODE Family Communication:  No Family Present    Disposition Plan:     Observation  Time spent:  38 Minutes  Ron Parker [facility] Pager [phone]   If 7AM -7PM Please Contact the Day Rounding Team MD for [facility]  If 7PM-7AM, Please Contact night-coverage  [email or web address] Password [email or web address] 08/14/2014, 3:58 AM

## 2014-08-14 NOTE — Progress Notes (Signed)
UR completed 

## 2014-08-14 NOTE — Progress Notes (Signed)
  Echocardiogram 2D Echocardiogram has been performed.  Janalyn Harder 08/14/2014, 3:36 PM

## 2014-08-14 NOTE — ED Notes (Signed)
Attempted report 

## 2014-08-15 NOTE — ED Provider Notes (Signed)
I saw and evaluated the patient, reviewed the resident's note and I agree with the findings and plan.  Toy Baker, MD 08/15/14 [phone]

## 2014-08-15 NOTE — Discharge Summary (Signed)
Physician Discharge Summary  Jonathan Perez ZOX:[id number] DOB: 1976-07-12 DOA: 08/13/2014  PCP: No PCP Per Patient  Admit date: 08/13/2014 Discharge date: 08/15/2014  Time spent: > 35 minutes  Recommendations for Outpatient Follow-up:  1. Please reassess patient's K levels 2. Also encourage cocaine cessation 3. Reassess blood pressures and consider placing on antihypertensive regimen should patient's blood pressure come back elevated  Discharge Diagnoses:  Principal Problem:   Chest pain Active Problems:   Leukocytosis, unspecified   Tobacco use disorder   Hypertensive urgency   Renal insufficiency   Discharge Condition: stable  Diet recommendation: heart healthy/low sodium  Filed Weights   08/13/14 2035 08/14/14 0552  Weight: 97.523 kg (215 lb) 111.857 kg (246 lb 9.6 oz)    History of present illness:  Pt is a 38 y/o who presented to the ED with substernal chest discomfort and palpitations. His UDS was positive for cocaine.  Hospital Course:  Chest discomfort - Troponins x 3 negative - Echocardiogram showed no wall motion abnormality and normal EF - chest pain resolved on day of discharge  Hypokalemia - replaced orally yesterday - recommend patient f/u with pcp for further evaluation and recommendations  HTN - recommended lifestyle modifications, decreasing sodium intake and increasing exercise regimen  Procedures:  Echocardiogram : EF 60%  Consultations:  none  Discharge Exam: Filed Vitals:   08/15/14 0519  BP: 125/98  Pulse: 78  Temp: 99 F (37.2 C)  Resp: 18    General: pt in nad, alert and awake Cardiovascular: rrr, no mrg Respiratory: CTA BL, no wheezes  Discharge Instructions You were cared for by a hospitalist during your hospital stay. If you have any questions about your discharge medications or the care you received while you were in the hospital after you are discharged, you can call the unit and asked to speak with the hospitalist on  call if the hospitalist that took care of you is not available. Once you are discharged, your primary care physician will handle any further medical issues. Please note that NO REFILLS for any discharge medications will be authorized once you are discharged, as it is imperative that you return to your primary care physician (or establish a relationship with a primary care physician if you do not have one) for your aftercare needs so that they can reassess your need for medications and monitor your lab values.  Discharge Instructions   Call MD for:  difficulty breathing, headache or visual disturbances    Complete by:  As directed      Call MD for:  temperature >100.4    Complete by:  As directed      Diet - low sodium heart healthy    Complete by:  As directed      Discharge instructions    Complete by:  As directed   Please avoid any cocaine or NSAIDs (NSAIDs due to your history of high blood pressure).  Have your pcp recheck your potassium levels.   Also avoid high salt diets and increase your exercise regimen.     Increase activity slowly    Complete by:  As directed           Current Discharge Medication List    CONTINUE these medications which have NOT CHANGED   Details  Aspirin-Salicylamide-Caffeine (BC HEADACHE POWDER PO) Take 1 packet by mouth daily as needed (for pain).    hydrocortisone cream 1 % Apply 1 application topically 2 (two) times daily. Apply to affected area twice daily  Qty: 30 g, Refills: 0      STOP taking these medications     ibuprofen (ADVIL,MOTRIN) 200 MG tablet        Allergies  Allergen Reactions  . Reglan [Metoclopramide] Itching  . Toradol [Ketorolac Tromethamine] Itching      The results of significant diagnostics from this hospitalization (including imaging, microbiology, ancillary and laboratory) are listed below for reference.    Significant Diagnostic Studies: Dg Chest 2 View  08/13/2014   CLINICAL DATA:  Sharp chest pain on the left  side. Tachycardia for 5 days.  EXAM: CHEST  2 VIEW  COMPARISON:  08/1913  FINDINGS: Normal heart size and pulmonary vascularity. No focal airspace disease or consolidation in the lungs. No blunting of costophrenic angles. No pneumothorax. Mediastinal contours appear intact. Postoperative changes in the thoracic spine with posterior rod and screw fixation.  IMPRESSION: No active cardiopulmonary disease.   Electronically Signed   By: Burman Nieves M.D.   On: 08/13/2014 22:26    Microbiology: No results found for this or any previous visit (from the past 240 hour(s)).   Labs: Basic Metabolic Panel:  Recent Labs Lab 08/13/14 2120 08/14/14 0356  NA 138 140  K 3.5* 3.0*  CL 100 101  CO2 24 26  GLUCOSE 114* 109*  BUN 23 24*  CREATININE 1.63* 1.93*  CALCIUM 9.1 8.8   Liver Function Tests: No results found for this basename: AST, ALT, ALKPHOS, BILITOT, PROT, ALBUMIN,  in the last 168 hours No results found for this basename: LIPASE, AMYLASE,  in the last 168 hours No results found for this basename: AMMONIA,  in the last 168 hours CBC:  Recent Labs Lab 08/13/14 2120 08/14/14 0356  WBC 12.1* 11.7*  HGB 17.4* 16.5  HCT 49.5 48.6  MCV 96.7 97.6  PLT 246 224   Cardiac Enzymes:  Recent Labs Lab 08/14/14 0357 08/14/14 1000 08/14/14 1624  TROPONINI <0.30 <0.30 <0.30   BNP: BNP (last 3 results) No results found for this basename: PROBNP,  in the last 8760 hours CBG: No results found for this basename: GLUCAP,  in the last 168 hours     Signed:  Penny Pia  [facility] 08/15/2014, 10:15 AM

## 2014-08-15 NOTE — Progress Notes (Signed)
Utilization Review Completed.   Michiko Lineman, RN, BSN Nurse Case Manager  

## 2014-10-21 ENCOUNTER — Ambulatory Visit (HOSPITAL_BASED_OUTPATIENT_CLINIC_OR_DEPARTMENT_OTHER): Payer: Medicare Other | Attending: Family Medicine

## 2014-10-21 DIAGNOSIS — Z6836 Body mass index (BMI) 36.0-36.9, adult: Secondary | ICD-10-CM | POA: Diagnosis not present

## 2014-10-21 DIAGNOSIS — G4730 Sleep apnea, unspecified: Secondary | ICD-10-CM | POA: Insufficient documentation

## 2014-10-21 DIAGNOSIS — G4719 Other hypersomnia: Secondary | ICD-10-CM

## 2014-10-21 DIAGNOSIS — R0683 Snoring: Secondary | ICD-10-CM

## 2014-10-21 DIAGNOSIS — G4710 Hypersomnia, unspecified: Secondary | ICD-10-CM | POA: Insufficient documentation

## 2014-10-23 DIAGNOSIS — G4733 Obstructive sleep apnea (adult) (pediatric): Secondary | ICD-10-CM

## 2014-10-23 NOTE — Sleep Study (Signed)
   NAME: Jonathan Perez DATE OF BIRTH:  06/30/1976 MEDICAL RECORD NUMBER [id number]  LOCATION: [facility]  PHYSICIAN: Efton Thomley D  DATE OF STUDY: 10/21/2014  SLEEP STUDY TYPE: Nocturnal Polysomnogram               REFERRING PHYSICIAN: Paulino RilyJones, Enrico G, MD  INDICATION FOR STUDY: hypersomnia with sleep apnea  EPWORTH SLEEPINESS SCORE:   12/24 HEIGHT:  5 feet 8 inches  WEIGHT:   240 pounds  BMI 36 NECK SIZE: 17.5 in.  MEDICATIONS: charted for review   SLEEP ARCHITECTURE: split study protocol. During the diagnostic phase, total sleep time 126 minutes with sleep efficiency 76.6%. Stage I was 7.5%, stage II 83.7%, stage III absent, REM 8.7% of total sleep time. Sleep latency 16 minutes, REM latency 83 minutes, awake after sleep onset 23.5 minutes, arousal index 10.5, bedtime medication: None  RESPIRATORY DATA: apnea hypopnea index (AHI) 53.3 per hour. 112 total events scored including 73 obstructive apneas, 3 central apneas, 36 hypopneas. Events were with supine sleep position. REM AHI 76.4 per hour. CPAP was titrated to 15 CWP, AHI 15.5 per hour. The technician then tried bilevel Pap with titration to inspiratory pressure 19 and expiratory pressure 14 CWP, AHI 0 per hour. He wore a medium fullface mask.  OXYGEN DATA: loud snoring before CPAP with oxygen desaturation to a nadir of 81% on room air. With CPAP control, snoring was prevented and mean oxygen saturation was 95% on room air.  CARDIAC DATA: sinus rhythm with PVCs  MOVEMENT/PARASOMNIA: no significant movement disturbance, no bathroom trips  IMPRESSION/ RECOMMENDATION:   1) Severe obstructive sleep apnea/hypopnea syndrome, AHI 53.3 per hour with supine events. REM AHI 76.4 per hour. Loud snoring with oxygen desaturation to a nadir of 81% on room air. 2) CPAP titration to 15 CWP gave inadequate control with residual AHI 15 per hour. He was then changed to bilevel Pap (BiPAP) and titrated to a final  inspiratory pressure 19 and expiratory pressure 14 CWP, AHI 0 per hour.  He wore a medium [employer] Simplus fullface mask with heated humidifier. Snoring was prevented and mean oxygen saturation was 95% with BiPAP.  Waymon BudgeYOUNG,[name] D Diplomate, American Board of Sleep Medicine  ELECTRONICALLY SIGNED ON:  10/23/2014, 2:50 PM [facility] PH: [phone]) [phone]   FX: [phone] ACCREDITED BY THE AMERICAN ACADEMY OF SLEEP MEDICINE

## 2014-11-14 ENCOUNTER — Emergency Department (INDEPENDENT_AMBULATORY_CARE_PROVIDER_SITE_OTHER): Payer: Medicare Other

## 2014-11-14 ENCOUNTER — Encounter (HOSPITAL_COMMUNITY): Payer: Self-pay | Admitting: *Deleted

## 2014-11-14 ENCOUNTER — Emergency Department (HOSPITAL_COMMUNITY)
Admission: EM | Admit: 2014-11-14 | Discharge: 2014-11-14 | Disposition: A | Discharge status: Home / Self Care | Payer: Medicare Other | Source: Home / Self Care | Attending: Emergency Medicine | Admitting: Emergency Medicine

## 2014-11-14 DIAGNOSIS — T1490 Injury, unspecified: Secondary | ICD-10-CM

## 2014-11-14 DIAGNOSIS — S60221A Contusion of right hand, initial encounter: Secondary | ICD-10-CM

## 2014-11-14 DIAGNOSIS — IMO0001 Reserved for inherently not codable concepts without codable children: Secondary | ICD-10-CM

## 2014-11-14 DIAGNOSIS — T1490XA Injury, unspecified, initial encounter: Secondary | ICD-10-CM

## 2014-11-14 DIAGNOSIS — R030 Elevated blood-pressure reading, without diagnosis of hypertension: Secondary | ICD-10-CM

## 2014-11-14 MED ORDER — ACETAMINOPHEN 325 MG PO TABS
ORAL_TABLET | ORAL | Status: AC
  Filled 2014-11-14: qty 3

## 2014-11-14 MED ORDER — ACETAMINOPHEN 325 MG PO TABS
975.0000 mg | ORAL_TABLET | Freq: Once | ORAL | Status: AC
  Administered 2014-11-14: 975 mg via ORAL

## 2014-11-14 NOTE — Discharge Instructions (Signed)
Your xrays are without evidence of fracture or dislocation. Please ice, elevate and apply ACE wrap to reduce swelling. Tylenol as directed on packaging for pain. Expect improvement over the next 1-2 weeks. Activity as tolerated. If no improvement, please follow up with the orthopedist listed on your discharge paperwork. In addition, your blood pressure is also elevated and this should be re-checked by your primary care doctor over the next 7-10 days.   Hand Contusion A hand contusion is a deep bruise on your hand area. Contusions are the result of an injury that caused bleeding under the skin. The contusion may turn blue, purple, or yellow. Minor injuries will give you a painless contusion, but more severe contusions may stay painful and swollen for a few weeks. CAUSES  A contusion is usually caused by a blow, trauma, or direct force to an area of the body. SYMPTOMS   Swelling and redness of the injured area.  Discoloration of the injured area.  Tenderness and soreness of the injured area.  Pain. DIAGNOSIS  The diagnosis can be made by taking a history and performing a physical exam. An X-ray, CT scan, or MRI may be needed to determine if there were any associated injuries, such as broken bones (fractures). TREATMENT  Often, the best treatment for a hand contusion is resting, elevating, icing, and applying cold compresses to the injured area. Over-the-counter medicines may also be recommended for pain control. HOME CARE INSTRUCTIONS   Put ice on the injured area.  Put ice in a plastic bag.  Place a towel between your skin and the bag.  Leave the ice on for 15-20 minutes, 03-04 times a day.  Only take over-the-counter or prescription medicines as directed by your caregiver. Your caregiver may recommend avoiding anti-inflammatory medicines (aspirin, ibuprofen, and naproxen) for 48 hours because these medicines may increase bruising.  If told, use an elastic wrap as directed. This can  help reduce swelling. You may remove the wrap for sleeping, showering, and bathing. If your fingers become numb, cold, or blue, take the wrap off and reapply it more loosely.  Elevate your hand with pillows to reduce swelling.  Avoid overusing your hand if it is painful. SEEK IMMEDIATE MEDICAL CARE IF:   You have increased redness, swelling, or pain in your hand.  Your swelling or pain is not relieved with medicines.  You have loss of feeling in your hand or are unable to move your fingers.  Your hand turns cold or blue.  You have pain when you move your fingers.  Your hand becomes warm to the touch.  Your contusion does not improve in 2 days. MAKE SURE YOU:   Understand these instructions.  Will watch your condition.  Will get help right away if you are not doing well or get worse. Document Released: 05/18/2002 Document Revised: 08/20/2012 Document Reviewed: 05/19/2012 [facility] Patient Information 2015 [place], [place]. This information is not intended to replace advice given to you by your health care provider. Make sure you discuss any questions you have with your health care provider.  Hypertension Hypertension, commonly called high blood pressure, is when the force of blood pumping through your arteries is too strong. Your arteries are the blood vessels that carry blood from your heart throughout your body. A blood pressure reading consists of a higher number over a lower number, such as 110/72. The higher number (systolic) is the pressure inside your arteries when your heart pumps. The lower number (diastolic) is the pressure inside your  arteries when your heart relaxes. Ideally you want your blood pressure below 120/80. Hypertension forces your heart to work harder to pump blood. Your arteries may become narrow or stiff. Having hypertension puts you at risk for heart disease, stroke, and other problems.  RISK FACTORS Some risk factors for high blood pressure are controllable.  Others are not.  Risk factors you cannot control include:   Race. You may be at higher risk if you are African American.  Age. Risk increases with age.  Gender. Men are at higher risk than women before age 40 years. After age 40, women are at higher risk than men. Risk factors you can control include:  Not getting enough exercise or physical activity.  Being overweight.  Getting too much fat, sugar, calories, or salt in your diet.  Drinking too much alcohol. SIGNS AND SYMPTOMS Hypertension does not usually cause signs or symptoms. Extremely high blood pressure (hypertensive crisis) may cause headache, anxiety, shortness of breath, and nosebleed. DIAGNOSIS  To check if you have hypertension, your health care provider will measure your blood pressure while you are seated, with your arm held at the level of your heart. It should be measured at least twice using the same arm. Certain conditions can cause a difference in blood pressure between your right and left arms. A blood pressure reading that is higher than normal on one occasion does not mean that you need treatment. If one blood pressure reading is high, ask your health care provider about having it checked again. TREATMENT  Treating high blood pressure includes making lifestyle changes and possibly taking medicine. Living a healthy lifestyle can help lower high blood pressure. You may need to change some of your habits. Lifestyle changes may include:  Following the DASH diet. This diet is high in fruits, vegetables, and whole grains. It is low in salt, red meat, and added sugars.  Getting at least 2 hours of brisk physical activity every week.  Losing weight if necessary.  Not smoking.  Limiting alcoholic beverages.  Learning ways to reduce stress. If lifestyle changes are not enough to get your blood pressure under control, your health care provider may prescribe medicine. You may need to take more than one. Work closely  with your health care provider to understand the risks and benefits. HOME CARE INSTRUCTIONS  Have your blood pressure rechecked as directed by your health care provider.   Take medicines only as directed by your health care provider. Follow the directions carefully. Blood pressure medicines must be taken as prescribed. The medicine does not work as well when you skip doses. Skipping doses also puts you at risk for problems.   Do not smoke.   Monitor your blood pressure at home as directed by your health care provider. SEEK MEDICAL CARE IF:   You think you are having a reaction to medicines taken.  You have recurrent headaches or feel dizzy.  You have swelling in your ankles.  You have trouble with your vision. SEEK IMMEDIATE MEDICAL CARE IF:  You develop a severe headache or confusion.  You have unusual weakness, numbness, or feel faint.  You have severe chest or abdominal pain.  You vomit repeatedly.  You have trouble breathing. MAKE SURE YOU:   Understand these instructions.  Will watch your condition.  Will get help right away if you are not doing well or get worse. Document Released: 11/26/2005 Document Revised: 04/12/2014 Document Reviewed: 09/18/2013 [facility] Patient Information 2015 [place], [place]LL[place]. This information is not  intended to replace advice given to you by your health care provider. Make sure you discuss any questions you have with your health care provider. ° °
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
## 2014-12-21 ENCOUNTER — Encounter (HOSPITAL_COMMUNITY): Payer: Self-pay | Admitting: Family Medicine

## 2014-12-21 ENCOUNTER — Emergency Department (INDEPENDENT_AMBULATORY_CARE_PROVIDER_SITE_OTHER)
Admission: EM | Admit: 2014-12-21 | Discharge: 2014-12-21 | Disposition: A | Discharge status: Home / Self Care | Payer: Medicare Other | Source: Home / Self Care | Attending: Family Medicine | Admitting: Family Medicine

## 2014-12-21 DIAGNOSIS — H6092 Unspecified otitis externa, left ear: Secondary | ICD-10-CM

## 2014-12-21 DIAGNOSIS — Z720 Tobacco use: Secondary | ICD-10-CM

## 2014-12-21 MED ORDER — NEOMYCIN-POLYMYXIN-HC 3.5-10000-1 OT SOLN: 3.0000 [drp] | Freq: Four times a day (QID) | OTIC | Status: DC

## 2014-12-21 MED ORDER — ANTIPYRINE-BENZOCAINE 5.4-1.4 % OT SOLN: 3.0000 [drp] | OTIC | Status: DC | PRN

## 2014-12-21 NOTE — ED Provider Notes (Signed)
CSN: [id number]     Arrival date & time 12/21/14  [id number] History   First MD Initiated Contact with Patient 12/21/14 1010     Chief Complaint  Patient presents with  . Otalgia    Left   (Consider location/radiation/quality/duration/timing/severity/associated sxs/prior Treatment) HPI  L ear pain: started 2 weeks ago. Getting worse. No fevers, or discharge. Decreased hearing. Does not use qtips or recent swimming or time in hot tub. OTC ear drops w/o benefit. Denies recent URI. Painful. Ibuprofen w/ some benefit.    Tobacco use: continues to smoke 0.5 ppd.    Past Medical History  Diagnosis Date  . Chronic back pain   . Kidney stones   . Kidney stones   . ADD (attention deficit disorder)   . Methadone use    Past Surgical History  Procedure Laterality Date  . Back surgery    . Hand surgery    . Hip surgery     Family History  Problem Relation Age of Onset  . Diabetes Other   . Stroke Other   . Diabetes Mother    History  Substance Use Topics  . Smoking status: Current Every Day Smoker -- 1.00 packs/day    Types: Cigarettes  . Smokeless tobacco: Not on file  . Alcohol Use: Yes     Comment: socially    Review of Systems Per HPI with all other pertinent systems negative.   Allergies  Reglan and Toradol  Home Medications   Prior to Admission medications   Medication Sig Start Date End Date Taking? Authorizing Provider  carvedilol (COREG) 12.5 MG tablet Take 12.5 mg by mouth 2 (two) times daily with a meal.   Yes Historical Provider, MD  antipyrine-benzocaine Lyla Son(AURALGAN) otic solution Place 3-4 drops into the left ear every 2 (two) hours as needed for ear pain. 12/21/14   Ozella Rocksavid J Merrell, MD  Aspirin-Salicylamide-Caffeine [facility](BC HEADACHE POWDER PO) Take 1 packet by mouth daily as needed (for pain).    Historical Provider, MD  hydrocortisone cream 1 % Apply 1 application topically 2 (two) times daily. Apply to affected area twice daily 08/02/14   Junius FinnerErin O'Malley, PA-C   neomycin-polymyxin-hydrocortisone (CORTISPORIN) otic solution Place 3 drops into the left ear 4 (four) times daily. 12/21/14   Ozella Rocksavid J Merrell, MD   BP 133/92 mmHg  Pulse 81  Temp(Src) 98.6 F (37 C) (Oral)  Resp 20  SpO2 98% Physical Exam  Constitutional: He is oriented to person, place, and time. He appears well-developed and well-nourished. No distress.  HENT:  Head: Normocephalic and atraumatic.  L external canal w/ skin swelling and mild maceration from the 2-5 o'clock location. Clear effusion behind TM w/o  Purulence or bulging R TM and canal nml  Eyes: EOM are normal. Pupils are equal, round, and reactive to light.  Neck: Normal range of motion. Neck supple.  Cardiovascular: Normal rate and normal heart sounds.   No murmur heard. Pulmonary/Chest: Effort normal and breath sounds normal. No respiratory distress. He has no wheezes.  Abdominal: Soft. He exhibits no distension.  Musculoskeletal: Normal range of motion. He exhibits no edema or tenderness.  Neurological: He is alert and oriented to person, place, and time.  Skin: Skin is warm. He is not diaphoretic.  Psychiatric: He has a normal mood and affect. His behavior is normal. Judgment and thought content normal.    ED Course  Procedures (including critical care time) Labs Review Labs Reviewed - No data to display  Imaging Review No results  found.   MDM   1. Otitis externa, left   2. Tobacco use    Auralgan adn cortisporin Ibuprofen for pain and for eustachian tube dysfunction Precautions given and all questions answered  Shelly Flatten, MD Family Medicine 12/21/2014, 10:40 AM      Ozella Rocks, MD 12/21/14 1040

## 2014-12-21 NOTE — ED Notes (Signed)
Pt complains of pain in the left ear for two weeks.  He reports not being able to hear out of the ear.  He denies any URI or injury to the ear.

## 2014-12-21 NOTE — Discharge Instructions (Signed)
You have otitis externa or an infection of the external ear canal This will likely need antibiotic drops in order to clear Please use the auralgan for severe pain Please take ibuprofen 600-800mg  every 6-8 hours for the next 2-4 days for inflammation of the eustachian tube.  Please call or come back if you get worse

## 2015-02-07 ENCOUNTER — Encounter (HOSPITAL_COMMUNITY): Payer: Self-pay | Admitting: Emergency Medicine

## 2015-02-07 ENCOUNTER — Emergency Department (INDEPENDENT_AMBULATORY_CARE_PROVIDER_SITE_OTHER)
Admission: EM | Admit: 2015-02-07 | Discharge: 2015-02-07 | Disposition: A | Discharge status: Home / Self Care | Payer: Medicare Other | Source: Home / Self Care

## 2015-02-07 DIAGNOSIS — N200 Calculus of kidney: Secondary | ICD-10-CM

## 2015-02-07 LAB — POCT URINALYSIS DIP (DEVICE)
Glucose, UA: NEGATIVE mg/dL
Ketones, ur: NEGATIVE mg/dL
LEUKOCYTES UA: NEGATIVE
Nitrite: NEGATIVE
PH: 7 (ref 5.0–8.0)
PROTEIN: 30 mg/dL — AB
Specific Gravity, Urine: 1.025 (ref 1.005–1.030)
Urobilinogen, UA: 2 mg/dL — ABNORMAL HIGH (ref 0.0–1.0)

## 2015-02-07 MED ORDER — IBUPROFEN 800 MG PO TABS
800.0000 mg | ORAL_TABLET | Freq: Once | ORAL | Status: AC
  Administered 2015-02-07: 800 mg via ORAL

## 2015-02-07 MED ORDER — IBUPROFEN 800 MG PO TABS
ORAL_TABLET | ORAL | Status: AC
  Filled 2015-02-07: qty 1

## 2015-02-07 MED ORDER — TAMSULOSIN HCL 0.4 MG PO CAPS: 0.4000 mg | ORAL_CAPSULE | Freq: Every day | ORAL | Status: DC

## 2015-02-07 MED ORDER — HYDROCODONE-ACETAMINOPHEN 5-325 MG PO TABS: ORAL_TABLET | ORAL | Status: DC

## 2015-02-07 MED ORDER — ACETAMINOPHEN 325 MG PO TABS
ORAL_TABLET | ORAL | Status: AC
  Filled 2015-02-07: qty 2

## 2015-02-07 MED ORDER — ACETAMINOPHEN 325 MG PO TABS
650.0000 mg | ORAL_TABLET | Freq: Once | ORAL | Status: AC
  Administered 2015-02-07: 650 mg via ORAL

## 2015-02-07 NOTE — ED Provider Notes (Signed)
   Chief Complaint   Back Pain   History of Present Illness   Jonathan HughsDavid B Almond is a 39 year old male who was awakened this morning by severe left CVA pain which radiated around to his left flank. The pain is sharp and rated 7/10 in intensity. This has been accompanied by intermittent blood in the urine. He's felt nauseated and vomited once. He had a kidney stone about 2 years ago. He denies any fever, chills, or diarrhea.  Review of Systems   Other than as noted above, the patient denies any of the following symptoms: General:  No fever or chills. GI:  No abdominal pain, back pain, nausea, or vomiting. GU: Hematuria, urethral discharge, penile lesions, penile pain, testicular pain, swelling, or mass, or inguinal lymphadenopathy.  PMFSH   Past medical history, family history, social history, meds, and allergies were reviewed.  He's allergic to Toradol and Reglan. He has high blood pressure and takes medication for that.  Physical Exam    Vital signs:  BP 151/90 mmHg  Pulse 51  Temp(Src) 98.9 F (37.2 C) (Oral)  Resp 16 Gen:  Alert, oriented, in no distress. Lungs:  Clear to auscultation, no wheezes, rales or rhonchi. Heart:  Regular rhythm, no gallop or murmer. Abdomen:  Flat and soft.  No tenderness to palpation, guarding, or rebound.  No hepato-splenomegaly or mass.  Bowel sounds were normally active.  No hernia. Back:  No CVA tenderness.  Skin:  Clear, warm and dry.  Labs   Results for orders placed or performed during the hospital encounter of 02/07/15  POCT urinalysis dip (device)  Result Value Ref Range   Glucose, UA NEGATIVE NEGATIVE mg/dL   Bilirubin Urine SMALL (A) NEGATIVE   Ketones, ur NEGATIVE NEGATIVE mg/dL   Specific Gravity, Urine 1.025 1.005 - 1.030   Hgb urine dipstick LARGE (A) NEGATIVE   pH 7.0 5.0 - 8.0   Protein, ur 30 (A) NEGATIVE mg/dL   Urobilinogen, UA 2.0 (H) 0.0 - 1.0 mg/dL   Nitrite NEGATIVE NEGATIVE   Leukocytes, UA NEGATIVE NEGATIVE       Course in Urgent Care Center   The following medications were given:  Medications  acetaminophen (TYLENOL) tablet 650 mg (650 mg Oral Given 02/07/15 1709)  ibuprofen (ADVIL,MOTRIN) tablet 800 mg (800 mg Oral Given 02/07/15 1709)   Assessment   The encounter diagnosis was Kidney stone.   Plan   1.  Meds:  The following meds were prescribed:   New Prescriptions   HYDROCODONE-ACETAMINOPHEN (NORCO/VICODIN) 5-325 MG PER TABLET    1 to 2 tabs every 4 to 6 hours as needed for pain.   TAMSULOSIN (FLOMAX) 0.4 MG CAPS CAPSULE    Take 1 capsule (0.4 mg total) by mouth daily.    2.  Patient Education/Counseling:  The patient was given appropriate handouts, self care instructions, and instructed in symptomatic relief.  Suggested extra fluid and to strain all urine. Follow-up with his primary care physician in about a week. If the pain hasn't resolved in 2-3 days suggested he follow-up with urologist.  3.  Follow up:  The patient was told to follow up here if no better in 3 to 4 days, or sooner if becoming worse in any way, and given some red flag symptoms such as fever, persistent vomiting, pain, or difficulty urinating which would prompt immediate return.        Reuben Likesavid C Roben Schliep, MD 02/07/15 [phone]

## 2015-02-07 NOTE — Discharge Instructions (Signed)

## 2015-02-09 LAB — URINE CULTURE
Colony Count: NO GROWTH
Culture: NO GROWTH

## 2015-08-20 ENCOUNTER — Encounter (HOSPITAL_COMMUNITY): Payer: Self-pay | Admitting: Emergency Medicine

## 2015-08-20 ENCOUNTER — Emergency Department (HOSPITAL_COMMUNITY)
Admission: EM | Admit: 2015-08-20 | Discharge: 2015-08-20 | Discharge status: Against Medical Advice | Payer: Medicare Other | Attending: Emergency Medicine | Admitting: Emergency Medicine

## 2015-08-20 DIAGNOSIS — Z720 Tobacco use: Secondary | ICD-10-CM | POA: Diagnosis not present

## 2015-08-20 DIAGNOSIS — G8929 Other chronic pain: Secondary | ICD-10-CM | POA: Insufficient documentation

## 2015-08-20 DIAGNOSIS — K088 Other specified disorders of teeth and supporting structures: Secondary | ICD-10-CM | POA: Diagnosis present

## 2015-08-20 NOTE — ED Notes (Signed)
C/o L upper dental pain/abscess that started around midnight.  Reports he broke tooth a couple weeks ago.  C/o swelling to L side of face.

## 2015-10-03 ENCOUNTER — Encounter (HOSPITAL_COMMUNITY): Payer: Self-pay | Admitting: Emergency Medicine

## 2015-10-03 ENCOUNTER — Inpatient Hospital Stay (HOSPITAL_COMMUNITY)
Admission: EM | Admit: 2015-10-03 | Discharge: 2015-10-06 | DRG: 918 | Disposition: A | Discharge status: Other Acute Inpatient Hospital | Payer: Medicare Other | Attending: Internal Medicine | Admitting: Internal Medicine

## 2015-10-03 DIAGNOSIS — Z6836 Body mass index (BMI) 36.0-36.9, adult: Secondary | ICD-10-CM

## 2015-10-03 DIAGNOSIS — N189 Chronic kidney disease, unspecified: Secondary | ICD-10-CM | POA: Diagnosis present

## 2015-10-03 DIAGNOSIS — T447X2A Poisoning by beta-adrenoreceptor antagonists, intentional self-harm, initial encounter: Secondary | ICD-10-CM | POA: Diagnosis not present

## 2015-10-03 DIAGNOSIS — G8929 Other chronic pain: Secondary | ICD-10-CM | POA: Diagnosis present

## 2015-10-03 DIAGNOSIS — T43622A Poisoning by amphetamines, intentional self-harm, initial encounter: Secondary | ICD-10-CM | POA: Diagnosis present

## 2015-10-03 DIAGNOSIS — T424X2A Poisoning by benzodiazepines, intentional self-harm, initial encounter: Secondary | ICD-10-CM | POA: Diagnosis present

## 2015-10-03 DIAGNOSIS — Z915 Personal history of self-harm: Secondary | ICD-10-CM

## 2015-10-03 DIAGNOSIS — T50901A Poisoning by unspecified drugs, medicaments and biological substances, accidental (unintentional), initial encounter: Secondary | ICD-10-CM | POA: Diagnosis present

## 2015-10-03 DIAGNOSIS — F1010 Alcohol abuse, uncomplicated: Secondary | ICD-10-CM | POA: Diagnosis present

## 2015-10-03 DIAGNOSIS — E876 Hypokalemia: Secondary | ICD-10-CM | POA: Diagnosis present

## 2015-10-03 DIAGNOSIS — F17210 Nicotine dependence, cigarettes, uncomplicated: Secondary | ICD-10-CM | POA: Diagnosis present

## 2015-10-03 DIAGNOSIS — F1410 Cocaine abuse, uncomplicated: Secondary | ICD-10-CM | POA: Diagnosis present

## 2015-10-03 DIAGNOSIS — M549 Dorsalgia, unspecified: Secondary | ICD-10-CM | POA: Diagnosis present

## 2015-10-03 DIAGNOSIS — I129 Hypertensive chronic kidney disease with stage 1 through stage 4 chronic kidney disease, or unspecified chronic kidney disease: Secondary | ICD-10-CM | POA: Diagnosis present

## 2015-10-03 DIAGNOSIS — Y929 Unspecified place or not applicable: Secondary | ICD-10-CM

## 2015-10-03 DIAGNOSIS — T510X2A Toxic effect of ethanol, intentional self-harm, initial encounter: Secondary | ICD-10-CM | POA: Diagnosis present

## 2015-10-03 DIAGNOSIS — F988 Other specified behavioral and emotional disorders with onset usually occurring in childhood and adolescence: Secondary | ICD-10-CM | POA: Diagnosis present

## 2015-10-03 DIAGNOSIS — E663 Overweight: Secondary | ICD-10-CM | POA: Diagnosis present

## 2015-10-03 DIAGNOSIS — F1190 Opioid use, unspecified, uncomplicated: Secondary | ICD-10-CM | POA: Diagnosis present

## 2015-10-03 LAB — CBG MONITORING, ED: GLUCOSE-CAPILLARY: 144 mg/dL — AB (ref 65–99)

## 2015-10-03 MED ORDER — NALOXONE HCL 2 MG/2ML IJ SOSY
2.0000 mg | PREFILLED_SYRINGE | Freq: Once | INTRAMUSCULAR | Status: AC
  Administered 2015-10-03: 2 mg via INTRAVENOUS
  Filled 2015-10-03: qty 2

## 2015-10-03 MED ORDER — SODIUM CHLORIDE 0.9 % IV BOLUS (SEPSIS)
1000.0000 mL | Freq: Once | INTRAVENOUS | Status: AC
  Administered 2015-10-04: 1000 mL via INTRAVENOUS

## 2015-10-03 MED ORDER — GLUCAGON HCL RDNA (DIAGNOSTIC) 1 MG IJ SOLR
1.0000 mg/h | INTRAVENOUS | Status: DC
  Administered 2015-10-04: 1 mg/h via INTRAVENOUS
  Filled 2015-10-03: qty 5

## 2015-10-03 MED ORDER — NALOXONE HCL 2 MG/2ML IJ SOSY
1.0000 mg/h | PREFILLED_SYRINGE | INTRAVENOUS | Status: DC
  Administered 2015-10-04: 1 mg/h via INTRAVENOUS
  Filled 2015-10-03: qty 4

## 2015-10-03 MED ORDER — ONDANSETRON HCL 4 MG/2ML IJ SOLN
4.0000 mg | Freq: Once | INTRAMUSCULAR | Status: AC
  Administered 2015-10-04: 4 mg via INTRAVENOUS
  Filled 2015-10-03: qty 2

## 2015-10-03 MED ORDER — GLUCAGON HCL RDNA (DIAGNOSTIC) 1 MG IJ SOLR
1.0000 mg | INTRAMUSCULAR | Status: DC
  Administered 2015-10-03 – 2015-10-04 (×2): 1 mg via INTRAVENOUS
  Filled 2015-10-03: qty 1

## 2015-10-03 NOTE — ED Provider Notes (Signed)
CSN: [id number]   Arrival date & time 10/03/15 2332  History  By signing my name below, I, Bethel Born, attest that this documentation has been prepared under the direction and in the presence of Bertrice Leder, MD. Electronically Signed: Bethel Born, ED Scribe. 10/04/2015. 1:44 AM.  Chief Complaint  Patient presents with  . Ingestion    carvedilol   Level V caveat secondary to the patient's refusal to cooperate  HPI Patient is a 39 y.o. male presenting with Ingested Medication. The history is provided by the patient and the EMS personnel. The history is limited by the condition of the patient (uncooperative). No language interpreter was used.  Ingestion This is a new problem. The current episode started less than 1 hour ago. The problem occurs constantly. The problem has been gradually worsening. Pertinent negatives include no abdominal pain. Nothing aggravates the symptoms. Nothing relieves the symptoms. He has tried nothing for the symptoms. The treatment provided no relief.   Brought in by EMS, Jonathan Perez is a 39 y.o. male with history of polysubstance abuse, intentional overdose, and ADD who presents to the Emergency Department complaining of an overdose tonight. Per EMS, pt admitted to taking 20 of his carvedilol tablets in attempt to harm himself. Friends on scene stated that he also drank 4 beers and most of a 1/5 of liquor. Pt admits to previous overdose on Xanax but denies drug use tonight. 2 mg of glucagon and IVF given by EMS.   Past Medical History  Diagnosis Date  . Chronic back pain   . Kidney stones   . Kidney stones   . ADD (attention deficit disorder)   . 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 Back surgery    . Hand surgery    . Hip surgery      Family History  Problem Relation Age of Onset  . Diabetes Other   . Stroke Other   . Diabetes Mother     Social History  Substance Use Topics  . Smoking status:  Current Every Day Smoker -- 1.00 packs/day    Types: Cigarettes  . Smokeless tobacco: None  . Alcohol Use: Yes     Comment: socially     Review of Systems  Unable to perform ROS: Acuity of condition  Gastrointestinal: Negative for abdominal pain.  Psychiatric/Behavioral: Positive for self-injury.   Home Medications   Prior to Admission medications   Medication Sig Start Date End Date Taking? Authorizing Provider  antipyrine-benzocaine Lyla Son) otic solution Place 3-4 drops into the left ear every 2 (two) hours as needed for ear pain. 12/21/14   Ozella Rocks, MD  Aspirin-Salicylamide-Caffeine [facility] HEADACHE POWDER PO) Take 1 packet by mouth daily as needed (for pain).    Historical Provider, MD  carvedilol (COREG) 12.5 MG tablet Take 12.5 mg by mouth 2 (two) times daily with a meal.    Historical Provider, MD  HYDROcodone-acetaminophen (NORCO/VICODIN) 5-325 MG per tablet 1 to 2 tabs every 4 to 6 hours as needed for pain. 02/07/15   Reuben Likes, MD  hydrocortisone cream 1 % Apply 1 application topically 2 (two) times daily. Apply to affected area twice daily 08/02/14   Junius Finner, PA-C  neomycin-polymyxin-hydrocortisone (CORTISPORIN) otic solution Place 3 drops into the left ear 4 (four) times daily. 12/21/14   Ozella Rocks, MD  tamsulosin (FLOMAX) 0.4 MG CAPS capsule Take 1 capsule (0.4 mg total) by mouth daily. 02/07/15  Reuben Likes, MD    Allergies  Reglan and Toradol  Triage Vitals: BP 98/59 mmHg  Pulse 65  Temp(Src) 97.5 F (36.4 C) (Oral)  Resp 12  Ht  (1.727 m)  Wt 220 lb (99.791 kg)  BMI 33.46 kg/m2  SpO2 96%  Physical Exam  Constitutional: He appears well-developed and well-nourished.  HENT:  Head: Normocephalic.  Mouth/Throat: Oropharynx is clear and moist.  Moist mucous membranes No exudate  Eyes: Pupils are equal, round, and reactive to light.  Neck: Normal range of motion. Neck supple.  Trachea midline  Cardiovascular: Normal rate and regular  rhythm.   No murmur heard. Pulmonary/Chest: Effort normal and breath sounds normal. No stridor. He has no wheezes. He has no rales.  CTAB  Abdominal: Soft. Bowel sounds are normal. He exhibits no mass. There is no tenderness. There is no rebound and no guarding.  Musculoskeletal: Normal range of motion. He exhibits no edema.  Neurological: He is alert. He has normal reflexes.  Skin: Skin is warm and dry.  Psychiatric: His affect is angry and labile. He is agitated and aggressive. He expresses impulsivity. He expresses suicidal ideation. He expresses suicidal plans.  Nursing note and vitals reviewed.   ED Course  Procedures   DIAGNOSTIC STUDIES: Oxygen Saturation is 96% on RA, normal by my interpretation.    COORDINATION OF CARE: 11:43 PM Treatment plan includes Glucagon, Narcan, and lab work.  1:43 AM-Consult complete with Dr. Leandrew Koyanagi (Intensivist). Patient case explained and discussed.  agrees to admit patient for further evaluation and treatment. Call ended at 1:44 AM.   Labs Reviewed  ETHANOL - Abnormal; Notable for the following:    Alcohol, Ethyl (B) 126 (*)    All other components within normal limits  ACETAMINOPHEN LEVEL - Abnormal; Notable for the following:    Acetaminophen (Tylenol), Serum <10 (*)    All other components within normal limits  URINE RAPID DRUG SCREEN, HOSP PERFORMED - Abnormal; Notable for the following:    Cocaine POSITIVE (*)    All other components within normal limits  HEPATIC FUNCTION PANEL - Abnormal; Notable for the following:    Total Protein 6.1 (*)    All other components within normal limits  CBG MONITORING, ED - Abnormal; Notable for the following:    Glucose-Capillary 144 (*)    All other components within normal limits  I-STAT CHEM 8, ED - Abnormal; Notable for the following:    Potassium 3.3 (*)    Creatinine, Ser 1.40 (*)    Glucose, Bld 124 (*)    Calcium, Ion 1.09 (*)    All other components within normal limits  CBC WITH  DIFFERENTIAL/PLATELET  PROTIME-INR  SALICYLATE LEVEL    Imaging Review No results found.  I personally reviewed and evaluated these lab results as a part of my medical decision-making.   EKG Interpretation  Date/Time:  Tuesday October 04 2015 00:02:52 EDT Ventricular Rate:  79 PR Interval:  145 QRS Duration: 111 QT Interval:  405 QTC Calculation: 464 R Axis:   68 Text Interpretation:  Sinus rhythm Confirmed by [facility]  MD, Ashlynne Shetterly ([id number]) on 10/04/2015 1:35:17 AM    MDM   Final diagnoses:  Suicide attempt by beta blocker overdose, initial encounter (HCC)   Medications  glucagon (human recombinant) (GLUCAGEN) injection 1 mg (0 mg Intravenous Stopped 10/04/15 0009)  naloxone (NARCAN) 4 mg in dextrose 5 % 250 mL infusion (1 mg/hr Intravenous New Bag/Given 10/04/15 0045)  glucagon (human recombinant) (GLUCAGEN) 5  mg in dextrose 5 % 50 mL (0.1 mg/mL) infusion (1 mg/hr Intravenous New Bag/Given 10/04/15 0045)  naloxone [facility]) injection 2 mg (2 mg Intravenous Given 10/03/15 2340)  sodium chloride 0.9 % bolus 1,000 mL (1,000 mLs Intravenous New Bag/Given 10/04/15 0001)  ondansetron (ZOFRAN) injection 4 mg (4 mg Intravenous Given 10/04/15 0008)   Results for orders placed or performed during the hospital encounter of 10/03/15  CBC with Differential/Platelet  Result Value Ref Range   WBC 9.6 4.0 - 10.5 K/uL   RBC 4.82 4.22 - 5.81 MIL/uL   Hemoglobin 16.0 13.0 - 17.0 g/dL   HCT 16.1 09.6 - 04.5 %   MCV 95.9 78.0 - 100.0 fL   MCH 33.2 26.0 - 34.0 pg   MCHC 34.6 30.0 - 36.0 g/dL   RDW 40.9 81.1 - 91.4 %   Platelets 238 150 - 400 K/uL   Neutrophils Relative % 58 %   Neutro Abs 5.5 1.7 - 7.7 K/uL   Lymphocytes Relative 32 %   Lymphs Abs 3.1 0.7 - 4.0 K/uL   Monocytes Relative 8 %   Monocytes Absolute 0.8 0.1 - 1.0 K/uL   Eosinophils Relative 2 %   Eosinophils Absolute 0.2 0.0 - 0.7 K/uL   Basophils Relative 0 %   Basophils Absolute 0.0 0.0 - 0.1 K/uL  Protime-INR   Result Value Ref Range   Prothrombin Time 13.8 11.6 - 15.2 seconds   INR 1.04 0.00 - 1.49  Ethanol  Result Value Ref Range   Alcohol, Ethyl (B) 126 (H) <5 mg/dL  Acetaminophen level  Result Value Ref Range   Acetaminophen (Tylenol), Serum <10 (L) 10 - 30 ug/mL  Salicylate level  Result Value Ref Range   Salicylate Lvl <4.0 2.8 - 30.0 mg/dL  Urine rapid drug screen (hosp performed)  Result Value Ref Range   Opiates NONE DETECTED NONE DETECTED   Cocaine POSITIVE (A) NONE DETECTED   Benzodiazepines NONE DETECTED NONE DETECTED   Amphetamines NONE DETECTED NONE DETECTED   Tetrahydrocannabinol NONE DETECTED NONE DETECTED   Barbiturates NONE DETECTED NONE DETECTED  Hepatic function panel  Result Value Ref Range   Total Protein 6.1 (L) 6.5 - 8.1 g/dL   Albumin 3.6 3.5 - 5.0 g/dL   AST 24 15 - 41 U/L   ALT 18 17 - 63 U/L   Alkaline Phosphatase 60 38 - 126 U/L   Total Bilirubin 0.9 0.3 - 1.2 mg/dL   Bilirubin, Direct 0.2 0.1 - 0.5 mg/dL   Indirect Bilirubin 0.7 0.3 - 0.9 mg/dL  POC CBG, ED  Result Value Ref Range   Glucose-Capillary 144 (H) 65 - 99 mg/dL  I-Stat Chem 8, ED  Result Value Ref Range   Sodium 139 135 - 145 mmol/L   Potassium 3.3 (L) 3.5 - 5.1 mmol/L   Chloride 103 101 - 111 mmol/L   BUN 13 6 - 20 mg/dL   Creatinine, Ser 7.82 (H) 0.61 - 1.24 mg/dL   Glucose, Bld 956 (H) 65 - 99 mg/dL   Calcium, Ion 2.13 (L) 1.12 - 1.23 mmol/L   TCO2 19 0 - 100 mmol/L   Hemoglobin 16.7 13.0 - 17.0 g/dL   HCT 08.6 57.8 - 46.9 %   No results found.  MDM Reviewed: previous chart, nursing note and vitals (care everyhwere and our system) Reviewed previous: labs Interpretation: labs and ECG (creatine 1.4 slight elevation, UDS positive for cocaine.  Negative for opiods but had response to narcan and has a h/o of methadone  use ) Total time providing critical care: 75-105 minutes. This excludes time spent performing separately reportable procedures and services. Consults: critical  care  CRITICAL CARE Performed by: Jasmine AwePALUMBO-RASCH,Erasmo Vertz K Total critical care time: 91 minutes Critical care time was exclusive of separately billable procedures and treating other patients. Critical care was necessary to treat or prevent imminent or life-threatening deterioration. Critical care was time spent personally by me on the following activities: development of treatment plan with patient and/or surrogate as well as nursing, discussions with consultants, evaluation of patient's response to treatment, examination of patient, obtaining history from patient or surrogate, ordering and performing treatments and interventions, ordering and review of laboratory studies, ordering and review of radiographic studies, pulse oximetry and re-evaluation of patient's condition.   I personally performed the services described in this documentation, which was scribed in my presence. The recorded information has been reviewed and is accurate.     Cy BlamerApril Tamari Busic, MD 10/04/15 [phone]

## 2015-10-03 NOTE — ED Notes (Signed)
700 bolus of fluid

## 2015-10-03 NOTE — ED Notes (Signed)
Per [facility] pt friend call for pt overdose. Pt alert and talking to EMS. Admitted to taking 20 of the carvedilol stated that he wanted to hurt himself and that he wished he would've taken the entire bottle. Pt notified that he didn't want to come was refusing to come to hospital. Pt became irate, not combative. LOC for 20-30 min. Pt not alert and talking after 2mg  of narcan.  EMS gave 2 mg of glucagon 700 bolus fluid.  18 L AC

## 2015-10-04 ENCOUNTER — Encounter (HOSPITAL_COMMUNITY): Payer: Self-pay | Admitting: Emergency Medicine

## 2015-10-04 DIAGNOSIS — T424X2A Poisoning by benzodiazepines, intentional self-harm, initial encounter: Secondary | ICD-10-CM | POA: Diagnosis present

## 2015-10-04 DIAGNOSIS — I129 Hypertensive chronic kidney disease with stage 1 through stage 4 chronic kidney disease, or unspecified chronic kidney disease: Secondary | ICD-10-CM | POA: Diagnosis present

## 2015-10-04 DIAGNOSIS — F1910 Other psychoactive substance abuse, uncomplicated: Secondary | ICD-10-CM | POA: Diagnosis not present

## 2015-10-04 DIAGNOSIS — T447X2A Poisoning by beta-adrenoreceptor antagonists, intentional self-harm, initial encounter: Secondary | ICD-10-CM | POA: Diagnosis present

## 2015-10-04 DIAGNOSIS — T43622A Poisoning by amphetamines, intentional self-harm, initial encounter: Secondary | ICD-10-CM | POA: Diagnosis present

## 2015-10-04 DIAGNOSIS — G9340 Encephalopathy, unspecified: Secondary | ICD-10-CM

## 2015-10-04 DIAGNOSIS — T510X2A Toxic effect of ethanol, intentional self-harm, initial encounter: Secondary | ICD-10-CM | POA: Diagnosis present

## 2015-10-04 DIAGNOSIS — N179 Acute kidney failure, unspecified: Secondary | ICD-10-CM | POA: Diagnosis not present

## 2015-10-04 DIAGNOSIS — N189 Chronic kidney disease, unspecified: Secondary | ICD-10-CM | POA: Diagnosis present

## 2015-10-04 DIAGNOSIS — M549 Dorsalgia, unspecified: Secondary | ICD-10-CM | POA: Diagnosis present

## 2015-10-04 DIAGNOSIS — E876 Hypokalemia: Secondary | ICD-10-CM | POA: Diagnosis present

## 2015-10-04 DIAGNOSIS — R45851 Suicidal ideations: Secondary | ICD-10-CM

## 2015-10-04 DIAGNOSIS — T50902A Poisoning by unspecified drugs, medicaments and biological substances, intentional self-harm, initial encounter: Secondary | ICD-10-CM

## 2015-10-04 DIAGNOSIS — Y929 Unspecified place or not applicable: Secondary | ICD-10-CM | POA: Diagnosis not present

## 2015-10-04 DIAGNOSIS — F1190 Opioid use, unspecified, uncomplicated: Secondary | ICD-10-CM | POA: Diagnosis present

## 2015-10-04 DIAGNOSIS — Z915 Personal history of self-harm: Secondary | ICD-10-CM | POA: Diagnosis not present

## 2015-10-04 DIAGNOSIS — T50901A Poisoning by unspecified drugs, medicaments and biological substances, accidental (unintentional), initial encounter: Secondary | ICD-10-CM | POA: Diagnosis present

## 2015-10-04 DIAGNOSIS — F1410 Cocaine abuse, uncomplicated: Secondary | ICD-10-CM | POA: Diagnosis present

## 2015-10-04 DIAGNOSIS — Z6836 Body mass index (BMI) 36.0-36.9, adult: Secondary | ICD-10-CM | POA: Diagnosis not present

## 2015-10-04 DIAGNOSIS — F988 Other specified behavioral and emotional disorders with onset usually occurring in childhood and adolescence: Secondary | ICD-10-CM | POA: Diagnosis present

## 2015-10-04 DIAGNOSIS — T1491 Suicide attempt: Secondary | ICD-10-CM

## 2015-10-04 DIAGNOSIS — F17210 Nicotine dependence, cigarettes, uncomplicated: Secondary | ICD-10-CM | POA: Diagnosis present

## 2015-10-04 DIAGNOSIS — F1010 Alcohol abuse, uncomplicated: Secondary | ICD-10-CM | POA: Diagnosis present

## 2015-10-04 DIAGNOSIS — G8929 Other chronic pain: Secondary | ICD-10-CM | POA: Diagnosis present

## 2015-10-04 DIAGNOSIS — E663 Overweight: Secondary | ICD-10-CM | POA: Diagnosis present

## 2015-10-04 LAB — ACETAMINOPHEN LEVEL: Acetaminophen (Tylenol), Serum: <10 ug/mL — ABNORMAL LOW (ref 10–30)

## 2015-10-04 LAB — CBC WITH DIFFERENTIAL/PLATELET
Basophils Absolute: 0 10*3/uL (ref 0.0–0.1)
Basophils Relative: 0 %
EOS ABS: 0.2 10*3/uL (ref 0.0–0.7)
EOS PCT: 2 %
HCT: 46.2 % (ref 39.0–52.0)
Hemoglobin: 16 g/dL (ref 13.0–17.0)
LYMPHS ABS: 3.1 10*3/uL (ref 0.7–4.0)
Lymphocytes Relative: 32 %
MCH: 33.2 pg (ref 26.0–34.0)
MCHC: 34.6 g/dL (ref 30.0–36.0)
MCV: 95.9 fL (ref 78.0–100.0)
MONOS PCT: 8 %
Monocytes Absolute: 0.8 10*3/uL (ref 0.1–1.0)
Neutro Abs: 5.5 10*3/uL (ref 1.7–7.7)
Neutrophils Relative %: 58 %
PLATELETS: 238 10*3/uL (ref 150–400)
RBC: 4.82 MIL/uL (ref 4.22–5.81)
RDW: 14.1 % (ref 11.5–15.5)
WBC: 9.6 10*3/uL (ref 4.0–10.5)

## 2015-10-04 LAB — I-STAT CHEM 8, ED
BUN: 13 mg/dL (ref 6–20)
CALCIUM ION: 1.09 mmol/L — AB (ref 1.12–1.23)
Chloride: 103 mmol/L (ref 101–111)
Creatinine, Ser: 1.4 mg/dL — ABNORMAL HIGH (ref 0.61–1.24)
Glucose, Bld: 124 mg/dL — ABNORMAL HIGH (ref 65–99)
HCT: 49 % (ref 39.0–52.0)
HEMOGLOBIN: 16.7 g/dL (ref 13.0–17.0)
Potassium: 3.3 mmol/L — ABNORMAL LOW (ref 3.5–5.1)
SODIUM: 139 mmol/L (ref 135–145)
TCO2: 19 mmol/L (ref 0–100)

## 2015-10-04 LAB — HEPATIC FUNCTION PANEL
ALBUMIN: 3.6 g/dL (ref 3.5–5.0)
ALT: 18 U/L (ref 17–63)
AST: 24 U/L (ref 15–41)
Alkaline Phosphatase: 60 U/L (ref 38–126)
BILIRUBIN INDIRECT: 0.7 mg/dL (ref 0.3–0.9)
Bilirubin, Direct: 0.2 mg/dL (ref 0.1–0.5)
TOTAL PROTEIN: 6.1 g/dL — AB (ref 6.5–8.1)
Total Bilirubin: 0.9 mg/dL (ref 0.3–1.2)

## 2015-10-04 LAB — SALICYLATE LEVEL: Salicylate Lvl: <4 mg/dL (ref 2.8–30.0)

## 2015-10-04 LAB — RAPID URINE DRUG SCREEN, HOSP PERFORMED
AMPHETAMINES: NOT DETECTED
Barbiturates: NOT DETECTED
Benzodiazepines: NOT DETECTED
Cocaine: POSITIVE — AB
OPIATES: NOT DETECTED
TETRAHYDROCANNABINOL: NOT DETECTED

## 2015-10-04 LAB — PROTIME-INR
INR: 1.04 (ref 0.00–1.49)
PROTHROMBIN TIME: 13.8 s (ref 11.6–15.2)

## 2015-10-04 LAB — ETHANOL: Alcohol, Ethyl (B): 126 mg/dL — ABNORMAL HIGH (ref <5)

## 2015-10-04 MED ORDER — ALPRAZOLAM 0.5 MG PO TABS
2.0000 mg | ORAL_TABLET | Freq: Four times a day (QID) | ORAL | Status: DC | PRN
  Administered 2015-10-04 – 2015-10-06 (×8): 2 mg via ORAL
  Filled 2015-10-04 (×9): qty 4

## 2015-10-04 MED ORDER — HYDROCODONE-ACETAMINOPHEN 5-325 MG PO TABS
1.0000 | ORAL_TABLET | Freq: Four times a day (QID) | ORAL | Status: DC | PRN
  Administered 2015-10-04: 1 via ORAL
  Filled 2015-10-04: qty 1

## 2015-10-04 MED ORDER — FENTANYL CITRATE (PF) 100 MCG/2ML IJ SOLN
25.0000 ug | INTRAMUSCULAR | Status: DC | PRN
  Administered 2015-10-04 – 2015-10-05 (×10): 50 ug via INTRAVENOUS
  Filled 2015-10-04 (×10): qty 2

## 2015-10-04 MED ORDER — PANTOPRAZOLE SODIUM 40 MG PO TBEC
40.0000 mg | DELAYED_RELEASE_TABLET | Freq: Every day | ORAL | Status: DC
  Administered 2015-10-04: 40 mg via ORAL
  Filled 2015-10-04 (×2): qty 1

## 2015-10-04 MED ORDER — HYDROCODONE-ACETAMINOPHEN 5-325 MG PO TABS
1.0000 | ORAL_TABLET | ORAL | Status: DC | PRN
  Administered 2015-10-04 – 2015-10-06 (×4): 2 via ORAL
  Filled 2015-10-04 (×5): qty 2

## 2015-10-04 MED ORDER — THIAMINE HCL 100 MG/ML IJ SOLN
Freq: Once | INTRAVENOUS | Status: AC
  Administered 2015-10-04: 04:00:00 via INTRAVENOUS
  Filled 2015-10-04: qty 1000

## 2015-10-04 MED ORDER — HEPARIN SODIUM (PORCINE) 5000 UNIT/ML IJ SOLN
5000.0000 [IU] | Freq: Three times a day (TID) | INTRAMUSCULAR | Status: DC
  Administered 2015-10-04 – 2015-10-06 (×7): 5000 [IU] via SUBCUTANEOUS
  Filled 2015-10-04 (×7): qty 1

## 2015-10-04 MED ORDER — POTASSIUM CHLORIDE CRYS ER 20 MEQ PO TBCR: 40.0000 meq | EXTENDED_RELEASE_TABLET | Freq: Once | ORAL | Status: DC

## 2015-10-04 NOTE — Clinical Social Work Psych Note (Signed)
Psych CSW received consult for intentional overdose/polysubstance abuse and possible psychiatric hospitalization.  Psych CSW now following.  Jonathan PennaGina Kerina Simoneau, LCSW [phone]  [facility] Licensed Clinical Social Worker

## 2015-10-04 NOTE — ED Notes (Signed)
ALL IVC paperwork went upstairs with pt

## 2015-10-04 NOTE — H&P (Signed)
PULMONARY / CRITICAL CARE MEDICINE   Name: Jonathan Perez MRN: [id number] DOB: 08/22/1976    ADMISSION DATE:  10/03/2015 CONSULTATION DATE:  10/04/2015  REFERRING MD :  EDP  CHIEF COMPLAINT:  Intentional overdose  INITIAL PRESENTATION: 39 year old male with history of overdose [facility] after intentional overdose of 20 carvedilol tablets. Friends on the scene reported that he also drank beer and liquor. He can also states that he took Xanax and Adderall as well. He was started on glucagon and Narcan drips in the emergency department with good response.  STUDIES:    SIGNIFICANT EVENTS: 10/25 > admit   HISTORY OF PRESENT ILLNESS:  39 year old male with past medical history as below, which includes chronic back pain, kidney stones, ADD, methadone use. He has a history of hospital admissions for substance abuse and suicide attempts. 10/25 friends called EMS report the patient had overdose. On their arrival he admitted to taking 20 carvedilol pills which are the 12.5 mg tablets. He also reports taking 5 Xanax and 3 Adderall in the last 24 hours. Friends report that he is consumed 4 beers and a fifth of liquor. Initially he was minimally responsive for EMS but after Narcan bolus en Route he regained consciousness and became markedly agitated. He has not appearance bradycardia. In the emergency department he was started on glucagon infusion and Narcan infusion. Since that time he has maintaining consciousness and then uncooperative even angry with staff. He states that his actions this evening where an attempt to end his life.  PAST MEDICAL HISTORY :   has a past medical history of Chronic back pain; Kidney stones; Kidney stones; ADD (attention deficit disorder); and Methadone use (HCC).  has past surgical history that includes Back surgery; Hand surgery; and Hip surgery. Prior to Admission medications   Medication Sig Start Date End Date Taking? Authorizing Provider   antipyrine-benzocaine Lyla Son(AURALGAN) otic solution Place 3-4 drops into the left ear every 2 (two) hours as needed for ear pain. 12/21/14   Ozella Rocksavid J Merrell, MD  Aspirin-Salicylamide-Caffeine [facility] HEADACHE POWDER PO) Take 1 packet by mouth daily as needed (for pain).    Historical Provider, MD  carvedilol (COREG) 12.5 MG tablet Take 12.5 mg by mouth 2 (two) times daily with a meal.    Historical Provider, MD  HYDROcodone-acetaminophen (NORCO/VICODIN) 5-325 MG per tablet 1 to 2 tabs every 4 to 6 hours as needed for pain. 02/07/15   Reuben Likesavid C Keller, MD  hydrocortisone cream 1 % Apply 1 application topically 2 (two) times daily. Apply to affected area twice daily 08/02/14   Junius FinnerErin O'Malley, PA-C  neomycin-polymyxin-hydrocortisone (CORTISPORIN) otic solution Place 3 drops into the left ear 4 (four) times daily. 12/21/14   Ozella Rocksavid J Merrell, MD  tamsulosin (FLOMAX) 0.4 MG CAPS capsule Take 1 capsule (0.4 mg total) by mouth daily. 02/07/15   Reuben Likesavid C Keller, MD   Allergies  Allergen Reactions  . Reglan [Metoclopramide] Itching  . Toradol [Ketorolac Tromethamine] Itching    FAMILY HISTORY:  indicated that his mother is alive. He indicated that his father is alive.  SOCIAL HISTORY:  reports that he has been smoking Cigarettes.  He has been smoking about 1.00 pack per day. He does not have any smokeless tobacco history on file. He reports that he drinks alcohol. He reports that he does not use illicit drugs.  REVIEW OF SYSTEMS:  Limited due to poor cooperation Bolds are positive  Constitutional: weight loss, gain, night sweats, Fevers,  chills, fatigue .  HEENT: headaches, Sore throat, sneezing, nasal congestion, post nasal drip, Difficulty swallowing, Tooth/dental problems, visual complaints visual changes, ear ache CV:  chest pain, radiates: ,Orthopnea, PND, swelling in lower extremities, dizziness, palpitations, syncope.  GI  heartburn, indigestion, abdominal pain, nausea, vomiting, diarrhea, change in bowel  habits, loss of appetite, bloody stools.  Resp: cough, productive: , hemoptysis, dyspnea, chest pain, pleuritic.  Skin: rash or itching or icterus GU: dysuria, change in color of urine, urgency or frequency. flank pain, hematuria  MS: back pain or swelling. decreased range of motion. Psych: change in mood or affect. depression or anxiety.  Neuro: difficulty with speech, weakness, numbness, ataxia    SUBJECTIVE:   VITAL SIGNS: Temp:  [97.5 F (36.4 C)] 97.5 F (36.4 C) (10/24 2343) Pulse Rate:  [65-81] 80 (10/25 0215) Resp:  [12-20] 20 (10/25 0215) BP: (98-130)/(59-99) 119/78 mmHg (10/25 0238) SpO2:  [91 %-97 %] 91 % (10/25 0215) Weight:  [99.791 kg (220 lb)] 99.791 kg (220 lb) (10/24 2343) HEMODYNAMICS:   VENTILATOR SETTINGS:   INTAKE / OUTPUT: No intake or output data in the 24 hours ending 10/04/15 0246  PHYSICAL EXAMINATION: General:  Overweight male  Neuro:  Agitated, angry. But alert and oriented HEENT:  [place]/AT, no JVD Cardiovascular:  RRR, no MRG  Lungs:  Clear breath sounds Abdomen:  Soft, non-distended, non-tender Musculoskeletal:  No acute deformity or ROM limitation Skin:  Grossly intact  LABS:  CBC  Recent Labs Lab 10/04/15 0004 10/04/15 0013  WBC 9.6  --   HGB 16.0 16.7  HCT 46.2 49.0  PLT 238  --    Coag's  Recent Labs Lab 10/04/15 0004  INR 1.04   BMET  Recent Labs Lab 10/04/15 0013  NA 139  K 3.3*  CL 103  BUN 13  CREATININE 1.40*  GLUCOSE 124*   Electrolytes No results for input(s): CALCIUM, MG, PHOS in the last 168 hours. Sepsis Markers No results for input(s): LATICACIDVEN, PROCALCITON, O2SATVEN in the last 168 hours. ABG No results for input(s): PHART, PCO2ART, PO2ART in the last 168 hours. Liver Enzymes  Recent Labs Lab 10/04/15 0004  AST 24  ALT 18  ALKPHOS 60  BILITOT 0.9  ALBUMIN 3.6   Cardiac Enzymes No results for input(s): TROPONINI, PROBNP in the last 168 hours. Glucose  Recent Labs Lab 10/03/15 2345   GLUCAP 144*    Imaging No results found.   ASSESSMENT / PLAN:  NEUROLOGIC A:   Intentional overdose/abuse of multiple substances (carvedilol, xanax, adderal, ETOH, cocaine) Chronic back pain Monitor for ETOH withdrawal  P:   RASS goal: 0 Glucagon gtt D/c narcan gtt in setting worsening pain Psych consult Suicide precautions Sitter Folate, thiamine, MVI via banana bag.  PULMONARY A: Risk for airway compromise in setting overdose  P:   Monitor closely   CARDIOVASCULAR A:  Carvedilol overdose, currently without bradycardia, hypotension H/o HTN  P:  Telemetry Glucagon gtt  EKG in AM  RENAL A:   Acute renal failure Hypokalemia Hypocalcemia  P:   IVF hydration Supplement K Repeat Bmet in AM  GASTROINTESTINAL A:   No acute issues  P:   NPO for tonight PO protonix   HEMATOLOGIC A:   No acute issues  P:  Follow CBC  INFECTIOUS A:   No acute issues   P:   Monitor off ABX  ENDOCRINE A:  No acute issues  P:   Monitor   FAMILY  - Updates:   - Inter-disciplinary family  meet or Palliative Care meeting due by:  11/1   Joneen Roach, AGACNP-BC [place] [facility]/Critical Care Pager [phone] or [phone]  10/04/2015 3:09 AM

## 2015-10-04 NOTE — ED Notes (Signed)
39 carvedilol remain in pill bottle that was filled on 10/03/15.   Pt took approx 21 pills.   Poison control to fax over information.

## 2015-10-04 NOTE — Progress Notes (Signed)
Pt very tearful at this time. Pt asked when he could leave and I explained that he would have to speak to the psychiatrist for him to make his recommendation. I explained that the protocol is in place to help him and apologized for the inconvenience. Pt then stated while crying "next time i'll succeed". I didn't hear him the first time so I said I'm sorry I didn't hear you and he repeated it  for a second time. Attempted to see if he would open up and talk to me and at this time patient just complains of being in pain and wants to go to sleep.

## 2015-10-04 NOTE — Progress Notes (Signed)
Pt admitted to unit, requesting his pain be managed.  Pt refusing to wear telemetry monitor, refusing for RN to check BP and do assessment.  Pt refused CHG bath as well as MRSA swab.  Pt is not cooperating at all with nursing staff.  CCM notified.  Sitter at bedside.    Bosie HelperHandy, Lajarvis Italiano Noel 10/04/2015 5:06 AM

## 2015-10-04 NOTE — ED Notes (Signed)
Lab called regarding urine sample, stated that this pt has been out of ED for quite some time, informed lab to call floor to verify the orders

## 2015-10-04 NOTE — Care Management Note (Signed)
Case Management Note  Patient Details  Name: Jonathan Perez MRN: [id number] Date of Birth: 06/11/1976  Subjective/Objective:  Pt admitted on 10/03/15 s/p prescription drug OD; he also consumed ETOH and cocaine.  PTA, pt independent of ADLS.                   Action/Plan: Psych CSW following pt.  Psych eval by MD is pending.  Will follow for discharge planning as pt progresses.    Expected Discharge Date:                  Expected Discharge Plan:  [facility]  In-House Referral:  Clinical Social Work  Discharge planning Services  CM Consult  Post Acute Care Choice:    Choice offered to:     DME Arranged:    DME Agency:     HH Arranged:    HH Agency:     Status of Service:  In process, will continue to follow  Medicare Important Message Given:    Date Medicare IM Given:    Medicare IM give by:    Date Additional Medicare IM Given:    Additional Medicare Important Message give by:     If discussed at Long Length of Stay Meetings, dates discussed:    Additional Comments:  Quintella BatonJulie W. Shavette Shoaff, RN, BSN  Trauma/Neuro ICU Case Manager [phone]

## 2015-10-04 NOTE — Consult Note (Signed)
[facility] Face-to-Face Psychiatry Consult   Reason for Consult:  Polysubstance abuse and intentional overdose Referring Physician:  Dr. Vassie Loll Patient Identification: Jonathan Perez MRN:  [id number] Principal Diagnosis: Overdose Diagnosis:   Patient Active Problem List   Diagnosis Date Noted  . Overdose [T50.901A] 10/04/2015  . Chest pain [R07.9] 08/14/2014  . Leukocytosis, unspecified [D72.829] 08/14/2014  . Tobacco use disorder [F17.200] 08/14/2014  . Hypertensive urgency [I16.0] 08/14/2014  . Renal insufficiency [N28.9] 08/14/2014  . Bipolar I disorder, most recent episode (or current) depressed, severe, without mention of psychotic behavior [F31.4] 08/29/2013  . Drug overdose [T50.901A] 08/28/2013  . Kidney stones [N20.0]   . ADD (attention deficit disorder) [F90.9]     Total Time spent with patient: 1 hour  Subjective:   Jonathan Perez is a 39 y.o. male patient admitted with intentional overdose of his prescription medication and intoxication with drug of abuse.  HPI:  Jonathan Perez is a 39 years old male seen, chart reviewed, case discussed with the staff RN for face-to-face psychiatric consultation and evaluation of status post intentional overdose of prescription medication with the suicidal intent. Patient reported his angry and tired of living his life and refuse to expand more history at this time. Patient history includes ADD, methadone use. Patient to urine drug screen is positive for cocaine and blood alcohol level is under 26 on arrival. Patient was known to be noncooperative with the staff members and angry with staff repeatedly. Patient endorses is intentional overdoses in an attempt to end his life.  Please review the following information for additional details. He has a history of hospital admissions for substance abuse and suicide attempts. 10/25 friends called EMS report the patient had overdose. On their arrival he admitted to taking 20 carvedilol pills which are the  12.5 mg tablets. He also reports taking 5 Xanax and 3 Adderall in the last 24 hours. Friends report that he is consumed 4 beers and a fifth of liquor. Initially he was minimally responsive for EMS but after Narcan bolus en Route he regained consciousness and became markedly agitated. He has not appearance bradycardia. In the emergency department he was started on glucagon infusion and Narcan infusion. Since that time he has maintaining consciousness and then uncooperative even angry with staff. He states that his actions this evening where an attempt to end his life.   Past Psychiatric History: Chronic history of substance abuse and suicidal attempts  Risk to Self: Is patient at risk for suicide?:  (pt incoherent unable to answer appropriately.) Risk to Others:   Prior Inpatient Therapy:   Prior Outpatient Therapy:    Past Medical History:  Past Medical History  Diagnosis Date  . Chronic back pain   . Kidney stones   . Kidney stones   . ADD (attention deficit disorder)   . 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 Back surgery    . Hand surgery    . Hip surgery     Family History:  Family History  Problem Relation Age of Onset  . Diabetes Other   . Stroke Other   . Diabetes Mother    Family Psychiatric  History: unknown Social History:  History  Alcohol Use  . Yes    Comment: socially     History  Drug Use No    Social History   Social History  . Marital Status: Married    Spouse Name: N/A  . Number of  Children: N/A  . Years of Education: N/A   Social History Main Topics  . Smoking status: Current Every Day Smoker -- 1.00 packs/day    Types: Cigarettes  . Smokeless tobacco: None  . Alcohol Use: Yes     Comment: socially  . Drug Use: No  . Sexual Activity: Not Asked   Other Topics Concern  . None   Social History Narrative   Additional Social History:                          Allergies:   Allergies   Allergen Reactions  . Reglan [Metoclopramide] Itching  . Toradol [Ketorolac Tromethamine] Itching    Labs:  Results for orders placed or performed during the hospital encounter of 10/03/15 (from the past 48 hour(s))  POC CBG, ED     Status: Abnormal   Collection Time: 10/03/15 11:45 PM  Result Value Ref Range   Glucose-Capillary 144 (H) 65 - 99 mg/dL  Urine rapid drug screen (hosp performed)     Status: Abnormal   Collection Time: 10/03/15 11:55 PM  Result Value Ref Range   Opiates NONE DETECTED NONE DETECTED   Cocaine POSITIVE (A) NONE DETECTED   Benzodiazepines NONE DETECTED NONE DETECTED   Amphetamines NONE DETECTED NONE DETECTED   Tetrahydrocannabinol NONE DETECTED NONE DETECTED   Barbiturates NONE DETECTED NONE DETECTED    Comment:        DRUG SCREEN FOR MEDICAL PURPOSES ONLY.  IF CONFIRMATION IS NEEDED FOR ANY PURPOSE, NOTIFY LAB WITHIN 5 DAYS.        LOWEST DETECTABLE LIMITS FOR URINE DRUG SCREEN Drug Class       Cutoff (ng/mL) Amphetamine      1000 Barbiturate      200 Benzodiazepine   200 Tricyclics       300 Opiates          300 Cocaine          300 THC              50   CBC with Differential/Platelet     Status: None   Collection Time: 10/04/15 12:04 AM  Result Value Ref Range   WBC 9.6 4.0 - 10.5 K/uL   RBC 4.82 4.22 - 5.81 MIL/uL   Hemoglobin 16.0 13.0 - 17.0 g/dL   HCT 16.1 09.6 - 04.5 %   MCV 95.9 78.0 - 100.0 fL   MCH 33.2 26.0 - 34.0 pg   MCHC 34.6 30.0 - 36.0 g/dL   RDW 40.9 81.1 - 91.4 %   Platelets 238 150 - 400 K/uL   Neutrophils Relative % 58 %   Neutro Abs 5.5 1.7 - 7.7 K/uL   Lymphocytes Relative 32 %   Lymphs Abs 3.1 0.7 - 4.0 K/uL   Monocytes Relative 8 %   Monocytes Absolute 0.8 0.1 - 1.0 K/uL   Eosinophils Relative 2 %   Eosinophils Absolute 0.2 0.0 - 0.7 K/uL   Basophils Relative 0 %   Basophils Absolute 0.0 0.0 - 0.1 K/uL  Protime-INR     Status: None   Collection Time: 10/04/15 12:04 AM  Result Value Ref Range    Prothrombin Time 13.8 11.6 - 15.2 seconds   INR 1.04 0.00 - 1.49  Ethanol     Status: Abnormal   Collection Time: 10/04/15 12:04 AM  Result Value Ref Range   Alcohol, Ethyl (B) 126 (H) <5 mg/dL    Comment:  LOWEST DETECTABLE LIMIT FOR SERUM ALCOHOL IS 5 mg/dL FOR MEDICAL PURPOSES ONLY   Acetaminophen level     Status: Abnormal   Collection Time: 10/04/15 12:04 AM  Result Value Ref Range   Acetaminophen (Tylenol), Serum <10 (L) 10 - 30 ug/mL    Comment:        THERAPEUTIC CONCENTRATIONS VARY SIGNIFICANTLY. A RANGE OF 10-30 ug/mL MAY BE AN EFFECTIVE CONCENTRATION FOR MANY PATIENTS. HOWEVER, SOME ARE BEST TREATED AT CONCENTRATIONS OUTSIDE THIS RANGE. ACETAMINOPHEN CONCENTRATIONS >150 ug/mL AT 4 HOURS AFTER INGESTION AND >50 ug/mL AT 12 HOURS AFTER INGESTION ARE OFTEN ASSOCIATED WITH TOXIC REACTIONS.   Salicylate level     Status: None   Collection Time: 10/04/15 12:04 AM  Result Value Ref Range   Salicylate Lvl <4.0 2.8 - 30.0 mg/dL  Hepatic function panel     Status: Abnormal   Collection Time: 10/04/15 12:04 AM  Result Value Ref Range   Total Protein 6.1 (L) 6.5 - 8.1 g/dL   Albumin 3.6 3.5 - 5.0 g/dL   AST 24 15 - 41 U/L   ALT 18 17 - 63 U/L   Alkaline Phosphatase 60 38 - 126 U/L   Total Bilirubin 0.9 0.3 - 1.2 mg/dL   Bilirubin, Direct 0.2 0.1 - 0.5 mg/dL   Indirect Bilirubin 0.7 0.3 - 0.9 mg/dL  I-Stat Chem 8, ED     Status: Abnormal   Collection Time: 10/04/15 12:13 AM  Result Value Ref Range   Sodium 139 135 - 145 mmol/L   Potassium 3.3 (L) 3.5 - 5.1 mmol/L   Chloride 103 101 - 111 mmol/L   BUN 13 6 - 20 mg/dL   Creatinine, Ser 1.61 (H) 0.61 - 1.24 mg/dL   Glucose, Bld 096 (H) 65 - 99 mg/dL   Calcium, Ion 0.45 (L) 1.12 - 1.23 mmol/L   TCO2 19 0 - 100 mmol/L   Hemoglobin 16.7 13.0 - 17.0 g/dL   HCT 40.9 81.1 - 91.4 %    Current Facility-Administered Medications  Medication Dose Route Frequency Provider Last Rate Last Dose  . fentaNYL (SUBLIMAZE)  injection 25-50 mcg  25-50 mcg Intravenous Q2H PRN Duayne Cal, NP   50 mcg at 10/04/15 1017  . heparin injection 5,000 Units  5,000 Units Subcutaneous 3 times per day Duayne Cal, NP   5,000 Units at 10/04/15 0530  . HYDROcodone-acetaminophen (NORCO/VICODIN) 5-325 MG per tablet 1 tablet  1 tablet Oral Q6H PRN Leslye Peer, MD   1 tablet at 10/04/15 0914  . pantoprazole (PROTONIX) EC tablet 40 mg  40 mg Oral Daily Duayne Cal, NP   40 mg at 10/04/15 1003  . potassium chloride SA (K-DUR,KLOR-CON) CR tablet 40 mEq  40 mEq Oral Once Duayne Cal, NP        Musculoskeletal: Strength & Muscle Tone: within normal limits Gait & Station: normal Patient leans: N/A  Psychiatric Specialty Exam: ROS patient seems to be irritable, agitated and angry during my evaluation and non-cooperative with the further history No Fever-chills, No Headache, No changes with Vision or hearing, reports vertigo No problems swallowing food or Liquids, No Chest pain, Cough or Shortness of Breath, No Abdominal pain, No Nausea or Vommitting, Bowel movements are regular, No Blood in stool or Urine, No dysuria, No new skin rashes or bruises, No new joints pains-aches,  No new weakness, tingling, numbness in any extremity, No recent weight gain or loss, No polyuria, polydypsia or polyphagia,   A full 10  point Review of Systems was done, except as stated above, all other Review of Systems were negative.   Blood pressure 90/42, pulse 76, temperature 97.9 F (36.6 C), temperature source Oral, resp. rate 13, height 5\' 5"  (1.651 m), weight 98.3 kg (216 lb 11.4 oz), SpO2 95 %.Body mass index is 36.06 kg/(m^2).  General Appearance: Guarded  Eye Contact::  Good  Speech:  Clear and Coherent  Volume:  Normal  Mood:  Angry, Anxious, Depressed and Irritable  Affect:  Congruent and Labile  Thought Process:  Coherent and Goal Directed  Orientation:  Full (Time, Place, and Person)  Thought Content:  WDL  Suicidal  Thoughts:  Yes.  with intent/plan  Homicidal Thoughts:  No  Memory:  Immediate;   Fair Recent;   Fair  Judgement:  Impaired  Insight:  Lacking  Psychomotor Activity:  Restlessness  Concentration:  Fair  Recall:  FiservFair  Fund of Knowledge:Fair  Language: Good  Akathisia:  Negative  Handed:  Right  AIMS (if indicated):     Assets:  Communication Skills  ADL's:  Impaired  Cognition: WNL  Sleep:      Treatment Plan Summary: Daily contact with patient to assess and evaluate symptoms and progress in treatment and Medication management  Disposition: Continue IVC and safety sitter Recommend psychiatric Inpatient admission when medically cleared. Supportive therapy provided about ongoing stressors.  Guerline Happ,JANARDHAHA R. 10/04/2015 11:18 AM

## 2015-10-04 NOTE — ED Notes (Signed)
Pt agitated with staff. PT yelling and cursing at staff. PT occasionally non cooperative.  PT in and out of being alert. [employer] and security outside of room for standby assistance.

## 2015-10-04 NOTE — Progress Notes (Signed)
PCCM Interval Note  Pt evaluated and chart reviewed.  Narcan gtt is off, pt having back pain. Glucagon gtt running, time of ingestion coreg was 19:00 on 10/24.   Plan:  - d/c glucagon and continue monitoring for bradycardia - start diet - adjust pain med regimen to avoid withdrawal - Psych consult called  Levy Pupaobert Aftin Lye, MD, PhD 10/04/2015, 9:03 AM [facility] [phone] or if no answer [phone]

## 2015-10-04 NOTE — Progress Notes (Signed)
Poison control call to check on patient closing out case

## 2015-10-04 NOTE — ED Notes (Signed)
PT states he took 5 xanax and 3 adderal. Pt did not give a time frame. PT also admits to drinking 1/5 of liquor.

## 2015-10-04 NOTE — ED Notes (Signed)
Verbal order given to stop the narcan from [employer]ens[employer] docs.

## 2015-10-04 NOTE — ED Notes (Signed)
Poison control contacted 

## 2015-10-04 NOTE — Progress Notes (Signed)
Patient very agitated and angry explain that patient was not suppose to have phone.  Using profanity security at bedside

## 2015-10-04 NOTE — Progress Notes (Addendum)
Pt anxious and upset regarding mother attempting to call staff with concern. At this time pt does not give consent to staff to release information to family. RN asked pt if he would like to talk and initially pt said no but then became tearful. He stated that his family was a source of stress in his life regarding their opinions about the pt's life, son and his relationships. He stated that he wanted to get away from them and that he wanted to "take myself out of the equation". He stated that he felt like he was a burden to others. 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 RN asked if he lived with family and he stated that he lives at home alone. He said that he had not seen his son in two years and his wife and he had separated and he has dated since then but were not successful relationships.  Pt also stated that he grew up in church and that he believed in God, but that he was "obsessed with dying" and "thinks about it all the time". RN acknowledged and asked pt if he had been in church recently and he stated that he had. He also stated that he was trying to hear God speak to him. Emotional support was given and RN offered pt a bible and/or a spiritual care consult. The pt refused both. Pt stated that he just wanted to talk to the psychiatrist so he could get out the hospital. He said that if he tried again then he would do something different. He said that he does not currently have a plan. Pt said "but I'll be ok".  Emotional support given to pt and RN tried to encourage pt and give him hope. Pt then declined to continue conversation but thanked Charity fundraiserN.

## 2015-10-04 NOTE — Progress Notes (Signed)
Patient trasfered from 33M to [phone] via wheelchair; alert and oriented x 4; complaints of pain in his back; IV saline locked in LAC and RAC; skin intact. Orient patient to room and unit; instructed how to use the call bell and  fall risk precautions. Will continue to monitor the patient.

## 2015-10-05 DIAGNOSIS — N179 Acute kidney failure, unspecified: Secondary | ICD-10-CM

## 2015-10-05 LAB — BASIC METABOLIC PANEL
Anion gap: 9 (ref 5–15)
BUN: 8 mg/dL (ref 6–20)
CHLORIDE: 109 mmol/L (ref 101–111)
CO2: 22 mmol/L (ref 22–32)
Calcium: 8.6 mg/dL — ABNORMAL LOW (ref 8.9–10.3)
Creatinine, Ser: 1.24 mg/dL (ref 0.61–1.24)
GFR calc Af Amer: >60 mL/min (ref >60)
GFR calc non Af Amer: >60 mL/min (ref >60)
GLUCOSE: 142 mg/dL — AB (ref 65–99)
POTASSIUM: 3.7 mmol/L (ref 3.5–5.1)
Sodium: 140 mmol/L (ref 135–145)

## 2015-10-05 LAB — MISC LABCORP TEST (SEND OUT): LABCORP TEST CODE: 701874

## 2015-10-05 MED ORDER — HYDROMORPHONE HCL 1 MG/ML IJ SOLN
1.0000 mg | INTRAMUSCULAR | Status: DC | PRN
  Administered 2015-10-05 – 2015-10-06 (×3): 1 mg via INTRAVENOUS
  Filled 2015-10-05 (×3): qty 1

## 2015-10-05 NOTE — Progress Notes (Addendum)
Per Rosey Batheresa, patient accepted at [facility], to Dr. Betti Cruzeddy, arrival time - after 9am on 10/27, to [facility]. Call report at [phone]. RN Mike CrazeSharisha informed.  Received IVC papers at fax: [phone] and Clinical research associatewriter faxed IVC to [facility] at [phone].   Jonathan Perez, LCSWA Disposition staff at [facility] 10/05/2015 10:48 PM

## 2015-10-05 NOTE — Progress Notes (Signed)
Patient has been accepted to go to behavior health in patient the [facility]. Dr Betti Cruzeddy is the accepting physician may arrive after 9am call report to [phone]. Will give information in report in am

## 2015-10-05 NOTE — Progress Notes (Signed)
LCSW covering for Psych CSW completed referrals for inpatient psych admission.  Referrals sent for review:  [facility]: [place]: [facility]   Will follow up once reviewed.  Jonathan EmoryHannah Ilda Laskin LCSW, MSW Clinical Social Work: Emergency Room [phone]

## 2015-10-05 NOTE — Progress Notes (Signed)
PULMONARY / CRITICAL CARE MEDICINE   Name: Jonathan Perez MRN: [id number] DOB: 11/21/1976    ADMISSION DATE:  10/03/2015 CONSULTATION DATE:  10/04/2015  REFERRING MD :  EDP  CHIEF COMPLAINT:  Intentional overdose  INITIAL PRESENTATION:  39 year old male with history of overdose [facility] after intentional overdose of 20 carvedilol tablets. Friends on the scene reported that he also drank beer and liquor. He can also states that he took Xanax and Adderall as well. He was started on glucagon and Narcan drips in the emergency department with good response.  STUDIES:    SIGNIFICANT EVENTS: 10/25 > admit 10/26 stable and ready for d/c. Stated he spoke w/ pastor and has plans for to see a councilor with his wife and individually. States he wants to live. Note the documentation by the RN on 10/25 indicating a desire to potentially harm self again in future.   SUBJECTIVE: wants to go home   VITAL SIGNS: Temp:  [98.3 F (36.8 C)-98.6 F (37 C)] 98.3 F (36.8 C) (10/25 2133) Pulse Rate:  [31-68] 68 (10/25 2133) Resp:  [11-18] 18 (10/25 2133) BP: (94-122)/(58-88) 112/88 mmHg (10/25 2133) SpO2:  [94 %-99 %] 96 % (10/25 2133) HEMODYNAMICS:   VENTILATOR SETTINGS:   INTAKE / OUTPUT:  Intake/Output Summary (Last 24 hours) at 10/05/15 1158 Last data filed at 10/05/15 0256  Gross per 24 hour  Intake   1030 ml  Output   1600 ml  Net   -570 ml    PHYSICAL EXAMINATION: General:  Overweight male, gets upset when discussing potential psych admit  Neuro:  Agitated, angry at times. But alert and oriented-->also over time becomes more cooperative  HEENT:  [place]/AT, no JVD Cardiovascular:  RRR, no MRG  Lungs:  Clear breath sounds Abdomen:  Soft, non-distended, non-tender Musculoskeletal:  No acute deformity or ROM limitation Skin:  Grossly intact  LABS:  CBC  Recent Labs Lab 10/04/15 0004 10/04/15 0013  WBC 9.6  --   HGB 16.0 16.7  HCT 46.2 49.0  PLT 238   --    Coag's  Recent Labs Lab 10/04/15 0004  INR 1.04   BMET  Recent Labs Lab 10/04/15 0013 10/05/15 1046  NA 139 140  K 3.3* 3.7  CL 103 109  CO2  --  22  BUN 13 8  CREATININE 1.40* 1.24  GLUCOSE 124* 142*   Electrolytes  Recent Labs Lab 10/05/15 1046  CALCIUM 8.6*   Sepsis Markers No results for input(s): LATICACIDVEN, PROCALCITON, O2SATVEN in the last 168 hours. ABG No results for input(s): PHART, PCO2ART, PO2ART in the last 168 hours. Liver Enzymes  Recent Labs Lab 10/04/15 0004  AST 24  ALT 18  ALKPHOS 60  BILITOT 0.9  ALBUMIN 3.6   Cardiac Enzymes No results for input(s): TROPONINI, PROBNP in the last 168 hours. Glucose  Recent Labs Lab 10/03/15 2345  GLUCAP 144*    Imaging No results found.   ASSESSMENT / PLAN:   Intentional overdose/abuse of multiple substances (carvedilol, xanax, adderal, ETOH, cocaine) Chronic back pain Monitor for ETOH withdrawal Plan:   Suicide precautions Sitter dispo depending on Psych;    Carvedilol overdose, currently without bradycardia, hypotension-->resolved H/o HTN Plan:  D/c tele Ok to resume bb on 10/27  CRI (baseline looks like he runs in 1.4 to 1.6 over the last year)-->now improved  Plan:   Encourage oral intake F/u w/ PCP    Simonne MartinetPeter E Gerianne Simonet ACNP-BC [facility]  Pager # [phone] OR # [phone] if no answer    10/05/2015 11:58 AM

## 2015-10-05 NOTE — Progress Notes (Signed)
eLink Physician-Brief Progress Note Patient Name: Ardis HughsDavid B Arias DOB: 04/30/1976 MRN: [id number]   Date of Service  10/05/2015  HPI/Events of Note  Complain of the pain.  eICU Interventions  Give the dilaudid.     Intervention Category Major Interventions: Other:  Camielle Sizer 10/05/2015, 10:54 PM

## 2015-10-05 NOTE — Progress Notes (Addendum)
[facility]y [facility]resa from [facility] requested patient's RN phone number. Writer obtained RN phone number [phone]. Writer called Rosey Batheresa at [employer] and spoke with Victorino DikeJennifer at [employer] and left requested info for [place].  This CSW will follow up with patient's placement.  Jonathan Perez, LCSWA Disposition staff 10/05/2015 8:55 PM

## 2015-10-05 NOTE — Clinical Documentation Improvement (Signed)
Hospitalist   (please document your query response in the progress notes and discharge summary, not on the query form itself.  "Acute Renal Failure" is documented in the H&P.  Please provide historical or baseline comparative data to support the diagnosis of "Acute Renal Failure" in the progress notes and discharge summary.  If the diagnosis of Acute Renal Failure is not applicable to this admission, please addend the documentation as appropriate.   Please exercise your independent, professional judgment when responding. A specific answer is not anticipated or expected.   Thank You, Jerral Ralphathy R Khalil Belote  RN BSN CCDS [phone] Health Information Management [place]

## 2015-10-05 NOTE — Progress Notes (Signed)
MD paged about patients pain.

## 2015-10-06 MED ORDER — ALPRAZOLAM 2 MG PO TABS: 2.0000 mg | ORAL_TABLET | Freq: Four times a day (QID) | ORAL | Status: DC | PRN

## 2015-10-06 NOTE — Progress Notes (Signed)
Report given to Christin FudgeShawn Rubush at [facility].

## 2015-10-06 NOTE — Progress Notes (Signed)
Patient accepted to [facility]. Spoke to MD and faxed DC summary to [facility]. [facility] and made aware of need for transport.   Sheriff to be her after lunch per report. RN made aware and agreeable. No other barriers at this time.  DC to [facility]  Deretha EmoryHannah Pasco Marchitto LCSW, MSW Clinical Social Work: Emergency Room [phone]

## 2015-10-06 NOTE — Care Management Note (Signed)
Case Management Note  Patient Details  Name: Jonathan Perez MRN: [id number] Date of Birth: 02/14/1976  Subjective/Objective:                 Patient to discharge to inpatient psych bed today.    Action/Plan:   Expected Discharge Date:                  [facility] Dis[facility] Plan:  [facility]  In-House Referral:  Clinical Social Work  Discharge planning Services  CM Consult  Post Acute Care Choice:    Choice offered to:     DME Arranged:    DME Agency:     HH Arranged:    HH Agency:     Status of Service:  In process, will continue to follow  Medicare Important Message Given:    Date Medicare IM Given:    Medicare IM give by:    Date Additional Medicare IM Given:    Additional Medicare Important Message give by:     If discussed at Long Length of Stay Meetings, dates discussed:    Additional Comments:  Lawerance SabalDebbie Edmar Blankenburg, RN 10/06/2015, 11:19 AM

## 2015-10-06 NOTE — Progress Notes (Signed)
NURSING PROGRESS NOTE  Jonathan HughsDavid B Perez [id number] Discharge Data: 10/06/2015 11:49 AM Attending Provider: Richarda OverlieNayana Abrol, MD PCP:No PCP Per Patient     Jonathan Hughsavid B Holsworth escorted by police officer and [facility] security staff off of [facility] to [facility], per MD order.  All IV's discontinued with no bleeding noted. All belongings sent with patient for patient to take home.   Last Vital Signs:  Blood pressure 147/90, pulse 68, temperature 98.6 F (37 C), temperature source Oral, resp. rate 18, height 5\' 5"  (1.651 m), weight 98.3 kg (216 lb 11.4 oz), SpO2 96 %.  Discharge Medication List   Medication List    STOP taking these medications        HYDROcodone-acetaminophen 5-325 MG tablet  Commonly known as:  NORCO/VICODIN      TAKE these medications        alprazolam 2 MG tablet  Commonly known as:  XANAX  Take 1 tablet (2 mg total) by mouth 4 (four) times daily as needed for anxiety.     amphetamine-dextroamphetamine 20 MG tablet  Commonly known as:  ADDERALL  Take 20 mg by mouth 2 (two) times daily. 8am and noon     carvedilol 12.5 MG tablet  Commonly known as:  COREG  Take 12.5 mg by mouth 2 (two) times daily with a meal.         Roma KayserStephanie Melina Mosteller, RN

## 2015-10-06 NOTE — Discharge Summary (Signed)
Physician Discharge Summary       Patient ID: Jonathan Perez MRN: [id number] DOB/AGE: 41/04/1976 41 y.o.  Admit date: 10/03/2015 Discharge date: 10/06/2015  Discharge Diagnoses:  Intentional overdose/abuse of multiple substances (carvedilol, xanax, adderal, ETOH, cocaine) Chronic back pain Monitor for ETOH withdrawal Carvedilol overdose, currently without bradycardia, hypotension H/o HTN CRI Hypokalemia Hypocalcemia Detailed Hospital Course:  39 year old male with past medical history as below, which includes chronic back pain, kidney stones, ADD, methadone use. He has a history of hospital admissions for substance abuse and suicide attempts. 10/25 friends called EMS report the patient had overdose. On their arrival he admitted to taking 20 carvedilol pills which are the 12.5 mg tablets. He also reports taking 5 Xanax and 3 Adderall in the last 24 hours. Friends report that he is consumed 4 beers and a fifth of liquor. Initially he was minimally responsive for EMS but after Narcan bolus en Route he regained consciousness and became markedly agitated. He has not appearance bradycardia. In the emergency department he was started on glucagon infusion and Narcan infusion. Since that time he has maintaining consciousness and then uncooperative even angry with staff. He states that his actions this evening where an attempt to end his life. He was admitted to the ICU. Treated supportively with IV hydration, glucagon infusion and monitored w/ continuous telemetry. [place] poison control was contacted. The glucagon gtt was discontinued on 9/25. He was watched in the ICU and then later transferred to telemetry. He was seen by psychiatry and it was deemed that he would require in-patient admission and Involuntary commitment. On 10/26 his creatinine had normalized to his baseline, he had no events on telemetry and we deemed him medically stable for discharge pending Psych disposition. Bed side sitter has  remained in room at all times. Of not on 10/26 he reported to medical team that he had spoke w/ his pastor and had plans to see a councilor with his wife and individually. Of note a subsequent note by nursing stated him to say "I know how to brown nose and kiss ass to get what I want." He now has a bed offer at a psychiatric facility and remains clinically stable for d/c    Discharge Plan by active problems   Intentional overdose/abuse of multiple substances (carvedilol, xanax, adderal, ETOH, cocaine) Chronic back pain Monitor for ETOH withdrawal Plan:  Suicide precautions Sitter IVC to psychiatric hospital    Carvedilol overdose, currently without bradycardia, hypotension-->resolved H/o HTN Plan:  Ok to resume bb (see d/c meds)  CRI (baseline looks like he runs in 1.4 to 1.6 over the last year)-->now improved to baseline  Plan:  Encourage oral intake F/u w/ PCP    Significant Hospital tests/ studies  Consults: psychiatry   Discharge Exam: BP 147/90 mmHg  Pulse 68  Temp(Src) 98.6 F (37 C) (Oral)  Resp 18  Ht 5\' 5"  (1.651 m)  Wt 98.3 kg (216 lb 11.4 oz)  BMI 36.06 kg/m2  SpO2 96% General: Overweight male, no distress.  Neuro: no focal def  HEENT: [place]/AT, no JVD Cardiovascular: RRR, no MRG  Lungs: Clear breath sounds Abdomen: Soft, non-distended, non-tender Musculoskeletal: No acute deformity or ROM limitation Skin: Grossly intact  Labs at discharge Lab Results  Component Value Date   CREATININE 1.24 10/05/2015   BUN 8 10/05/2015   NA 140 10/05/2015   K 3.7 10/05/2015   CL 109 10/05/2015   CO2 22 10/05/2015   Lab Results  Component Value Date  WBC 9.6 10/04/2015   HGB 16.7 10/04/2015   HCT 49.0 10/04/2015   MCV 95.9 10/04/2015   PLT 238 10/04/2015   Lab Results  Component Value Date   ALT 18 10/04/2015   AST 24 10/04/2015   ALKPHOS 60 10/04/2015   BILITOT 0.9 10/04/2015   Lab Results  Component Value Date   INR 1.04 10/04/2015    INR 0.94 07/11/2012    Current radiology studies No results found.  Disposition:  07-Left Against Medical Advice      Discharge Instructions    Diet - low sodium heart healthy    Complete by:  As directed      Increase activity slowly    Complete by:  As directed             Medication List    STOP taking these medications        HYDROcodone-acetaminophen 5-325 MG tablet  Commonly known as:  NORCO/VICODIN      TAKE these medications        alprazolam 2 MG tablet  Commonly known as:  XANAX  Take 1 tablet (2 mg total) by mouth 4 (four) times daily as needed for anxiety.     amphetamine-dextroamphetamine 20 MG tablet  Commonly known as:  ADDERALL  Take 20 mg by mouth 2 (two) times daily. 8am and noon     carvedilol 12.5 MG tablet  Commonly known as:  COREG  Take 12.5 mg by mouth 2 (two) times daily with a meal.         Discharged Condition: stable  Physician Statement:   The Patient was personally examined, the discharge assessment and plan has been personally reviewed and I agree with ACNP Shayanna Thatch's assessment and plan. > 30 minutes of time have been dedicated to discharge assessment, planning and discharge instructions.   Signed: Peniel Biel,PETE 10/06/2015, 10:14 AM

## 2015-10-06 NOTE — Progress Notes (Signed)
[facility], no answer.

## 2015-10-06 NOTE — Progress Notes (Signed)
During bedside report pt states, "I know how to brown nose and kiss ass to get what I want." Sitter remains at bedside. No complaints of pain at this time. Will cont to monitor.

## 2015-10-22 ENCOUNTER — Emergency Department (HOSPITAL_COMMUNITY): Payer: Medicare Other

## 2015-10-22 ENCOUNTER — Inpatient Hospital Stay (HOSPITAL_COMMUNITY)
Admission: EM | Admit: 2015-10-22 | Discharge: 2015-10-25 | DRG: 917 | Disposition: A | Discharge status: Other Acute Inpatient Hospital | Payer: Medicare Other | Attending: Internal Medicine | Admitting: Internal Medicine

## 2015-10-22 ENCOUNTER — Other Ambulatory Visit: Payer: Self-pay

## 2015-10-22 DIAGNOSIS — E162 Hypoglycemia, unspecified: Secondary | ICD-10-CM | POA: Diagnosis present

## 2015-10-22 DIAGNOSIS — G8929 Other chronic pain: Secondary | ICD-10-CM | POA: Diagnosis present

## 2015-10-22 DIAGNOSIS — R45851 Suicidal ideations: Secondary | ICD-10-CM | POA: Diagnosis not present

## 2015-10-22 DIAGNOSIS — Z888 Allergy status to other drugs, medicaments and biological substances status: Secondary | ICD-10-CM

## 2015-10-22 DIAGNOSIS — F1410 Cocaine abuse, uncomplicated: Secondary | ICD-10-CM | POA: Diagnosis present

## 2015-10-22 DIAGNOSIS — R9431 Abnormal electrocardiogram [ECG] [EKG]: Secondary | ICD-10-CM | POA: Diagnosis present

## 2015-10-22 DIAGNOSIS — I160 Hypertensive urgency: Secondary | ICD-10-CM | POA: Diagnosis present

## 2015-10-22 DIAGNOSIS — F319 Bipolar disorder, unspecified: Secondary | ICD-10-CM | POA: Diagnosis present

## 2015-10-22 DIAGNOSIS — F419 Anxiety disorder, unspecified: Secondary | ICD-10-CM | POA: Diagnosis present

## 2015-10-22 DIAGNOSIS — T50902A Poisoning by unspecified drugs, medicaments and biological substances, intentional self-harm, initial encounter: Principal | ICD-10-CM | POA: Diagnosis present

## 2015-10-22 DIAGNOSIS — T50901A Poisoning by unspecified drugs, medicaments and biological substances, accidental (unintentional), initial encounter: Secondary | ICD-10-CM | POA: Diagnosis present

## 2015-10-22 DIAGNOSIS — T50902D Poisoning by unspecified drugs, medicaments and biological substances, intentional self-harm, subsequent encounter: Secondary | ICD-10-CM | POA: Diagnosis not present

## 2015-10-22 DIAGNOSIS — J449 Chronic obstructive pulmonary disease, unspecified: Secondary | ICD-10-CM | POA: Diagnosis present

## 2015-10-22 DIAGNOSIS — F988 Other specified behavioral and emotional disorders with onset usually occurring in childhood and adolescence: Secondary | ICD-10-CM | POA: Diagnosis present

## 2015-10-22 DIAGNOSIS — T1491XA Suicide attempt, initial encounter: Secondary | ICD-10-CM | POA: Diagnosis present

## 2015-10-22 DIAGNOSIS — F17210 Nicotine dependence, cigarettes, uncomplicated: Secondary | ICD-10-CM | POA: Diagnosis present

## 2015-10-22 DIAGNOSIS — M545 Low back pain: Secondary | ICD-10-CM | POA: Diagnosis present

## 2015-10-22 DIAGNOSIS — Z87442 Personal history of urinary calculi: Secondary | ICD-10-CM

## 2015-10-22 DIAGNOSIS — T1491 Suicide attempt: Secondary | ICD-10-CM | POA: Diagnosis not present

## 2015-10-22 DIAGNOSIS — G92 Toxic encephalopathy: Secondary | ICD-10-CM | POA: Diagnosis present

## 2015-10-22 DIAGNOSIS — I4581 Long QT syndrome: Secondary | ICD-10-CM | POA: Diagnosis present

## 2015-10-22 DIAGNOSIS — T50904A Poisoning by unspecified drugs, medicaments and biological substances, undetermined, initial encounter: Secondary | ICD-10-CM

## 2015-10-22 DIAGNOSIS — F1199 Opioid use, unspecified with unspecified opioid-induced disorder: Secondary | ICD-10-CM | POA: Diagnosis present

## 2015-10-22 DIAGNOSIS — J9601 Acute respiratory failure with hypoxia: Secondary | ICD-10-CM | POA: Diagnosis present

## 2015-10-22 DIAGNOSIS — E876 Hypokalemia: Secondary | ICD-10-CM | POA: Diagnosis present

## 2015-10-22 DIAGNOSIS — G9341 Metabolic encephalopathy: Secondary | ICD-10-CM | POA: Diagnosis not present

## 2015-10-22 DIAGNOSIS — T502X2A Poisoning by carbonic-anhydrase inhibitors, benzothiadiazides and other diuretics, intentional self-harm, initial encounter: Secondary | ICD-10-CM | POA: Diagnosis not present

## 2015-10-22 HISTORY — DX: Bipolar disorder, unspecified: F31.9

## 2015-10-22 HISTORY — DX: Anxiety disorder, unspecified: F41.9

## 2015-10-22 LAB — I-STAT ARTERIAL BLOOD GAS, ED
ACID-BASE EXCESS: 1 mmol/L (ref 0.0–2.0)
ACID-BASE EXCESS: 3 mmol/L — AB (ref 0.0–2.0)
BICARBONATE: 24.6 meq/L — AB (ref 20.0–24.0)
Bicarbonate: 26.6 mEq/L — ABNORMAL HIGH (ref 20.0–24.0)
O2 SAT: 95 %
O2 SAT: 99 %
PH ART: 7.471 — AB (ref 7.350–7.450)
PO2 ART: 116 mmHg — AB (ref 80.0–100.0)
TCO2: 26 mmol/L (ref 0–100)
TCO2: 28 mmol/L (ref 0–100)
pCO2 arterial: 34.6 mmHg — ABNORMAL LOW (ref 35.0–45.0)
pCO2 arterial: 36.2 mmHg (ref 35.0–45.0)
pH, Arterial: 7.456 — ABNORMAL HIGH (ref 7.350–7.450)
pO2, Arterial: 67 mmHg — ABNORMAL LOW (ref 80.0–100.0)

## 2015-10-22 LAB — I-STAT CHEM 8, ED
BUN: 13 mg/dL (ref 6–20)
CALCIUM ION: 1.11 mmol/L — AB (ref 1.13–1.30)
CREATININE: 1 mg/dL (ref 0.61–1.24)
Chloride: 105 mmol/L (ref 101–111)
GLUCOSE: 74 mg/dL (ref 65–99)
HEMATOCRIT: 51 % (ref 39.0–52.0)
HEMOGLOBIN: 17.3 g/dL — AB (ref 13.0–17.0)
Potassium: 3.5 mmol/L (ref 3.5–5.1)
Sodium: 145 mmol/L (ref 135–145)
TCO2: 23 mmol/L (ref 0–100)

## 2015-10-22 LAB — MRSA PCR SCREENING: MRSA BY PCR: POSITIVE — AB

## 2015-10-22 LAB — CBG MONITORING, ED: GLUCOSE-CAPILLARY: 78 mg/dL (ref 65–99)

## 2015-10-22 LAB — I-STAT CG4 LACTIC ACID, ED: Lactic Acid, Venous: 1.99 mmol/L (ref 0.5–2.0)

## 2015-10-22 MED ORDER — POTASSIUM CHLORIDE 10 MEQ/100ML IV SOLN
10.0000 meq | INTRAVENOUS | Status: AC
  Administered 2015-10-22 (×3): 10 meq via INTRAVENOUS
  Filled 2015-10-22 (×3): qty 100

## 2015-10-22 MED ORDER — CETYLPYRIDINIUM CHLORIDE 0.05 % MT LIQD
7.0000 mL | Freq: Two times a day (BID) | OROMUCOSAL | Status: DC
  Administered 2015-10-22 – 2015-10-25 (×6): 7 mL via OROMUCOSAL

## 2015-10-22 MED ORDER — FOLIC ACID 1 MG PO TABS
1.0000 mg | ORAL_TABLET | Freq: Every day | ORAL | Status: DC
  Administered 2015-10-24 – 2015-10-25 (×2): 1 mg via ORAL
  Filled 2015-10-22 (×2): qty 1

## 2015-10-22 MED ORDER — ADULT MULTIVITAMIN W/MINERALS CH
1.0000 | ORAL_TABLET | Freq: Every day | ORAL | Status: DC
  Administered 2015-10-24 – 2015-10-25 (×2): 1 via ORAL
  Filled 2015-10-22 (×3): qty 1

## 2015-10-22 MED ORDER — SODIUM CHLORIDE 0.9 % IV BOLUS (SEPSIS)
1000.0000 mL | Freq: Once | INTRAVENOUS | Status: AC
  Administered 2015-10-22: 1000 mL via INTRAVENOUS

## 2015-10-22 MED ORDER — THIAMINE HCL 100 MG/ML IJ SOLN
100.0000 mg | Freq: Every day | INTRAMUSCULAR | Status: DC
  Administered 2015-10-22 – 2015-10-23 (×2): 100 mg via INTRAVENOUS
  Filled 2015-10-22 (×2): qty 2

## 2015-10-22 MED ORDER — HYDRALAZINE HCL 20 MG/ML IJ SOLN
10.0000 mg | Freq: Once | INTRAMUSCULAR | Status: AC
  Administered 2015-10-22: 10 mg via INTRAVENOUS
  Filled 2015-10-22: qty 1

## 2015-10-22 MED ORDER — LORAZEPAM 1 MG PO TABS
1.0000 mg | ORAL_TABLET | Freq: Four times a day (QID) | ORAL | Status: DC | PRN
  Administered 2015-10-23 – 2015-10-25 (×5): 1 mg via ORAL
  Filled 2015-10-22 (×5): qty 1

## 2015-10-22 MED ORDER — LORAZEPAM 2 MG/ML IJ SOLN: 1.0000 mg | Freq: Four times a day (QID) | INTRAMUSCULAR | Status: DC | PRN

## 2015-10-22 MED ORDER — SODIUM CHLORIDE 0.9 % IJ SOLN
3.0000 mL | Freq: Two times a day (BID) | INTRAMUSCULAR | Status: DC
  Administered 2015-10-23: 3 mL via INTRAVENOUS

## 2015-10-22 MED ORDER — VITAMIN B-1 100 MG PO TABS
100.0000 mg | ORAL_TABLET | Freq: Every day | ORAL | Status: DC
  Administered 2015-10-24 – 2015-10-25 (×2): 100 mg via ORAL
  Filled 2015-10-22 (×2): qty 1

## 2015-10-22 MED ORDER — KCL IN DEXTROSE-NACL 20-5-0.9 MEQ/L-%-% IV SOLN
INTRAVENOUS | Status: DC
  Administered 2015-10-22 – 2015-10-23 (×3): via INTRAVENOUS
  Filled 2015-10-22 (×5): qty 1000

## 2015-10-22 MED ORDER — POTASSIUM CHLORIDE 10 MEQ/100ML IV SOLN
10.0000 meq | Freq: Once | INTRAVENOUS | Status: AC
  Administered 2015-10-22: 10 meq via INTRAVENOUS
  Filled 2015-10-22: qty 100

## 2015-10-22 MED ORDER — INSULIN ASPART 100 UNIT/ML ~~LOC~~ SOLN: 0.0000 [IU] | SUBCUTANEOUS | Status: DC

## 2015-10-22 MED ORDER — LORAZEPAM 1 MG PO TABS: 1.0000 mg | ORAL_TABLET | Freq: Four times a day (QID) | ORAL | Status: DC | PRN

## 2015-10-22 NOTE — ED Notes (Signed)
Spoke with poison control and they reported to countinue care as we are doing and provide supportive care as necessary.

## 2015-10-22 NOTE — H&P (Signed)
[facility] History and Physical                                                                                    Jonathan Perez, is a [age] y.o. unknown  MRN: [id number]   DOB - 12/10/1877  Admit Date - 10/22/2015  Outpatient Primary MD for the patient is No primary care provider on file.  Referring MD: Alyce Paganees /ER  With History of -  No past medical history on file.    No past surgical history on file.  in for   Chief Complaint  Patient presents with  . Drug Overdose     HPI This is a male patient, unknown age or name, who came in by EMS with intentional drug overdose. Per EMS reports the patient called 911 claiming that he was going to kill himself by overdosing on 100 over-the-counter sleeping pills. EMS reports that upon their arrival to the patient's residence the patient was alternatively combative and apneic. At times they had to perform BVM ventilatory support. [place]; there was a roommate present who was unable to tell EMS exactly what type of medications the patient had consumed and was unable to tell them the patient's name or provide any documentation of the patient's identity. Patient was given 2 mg IV Narcan by EMS with no change in condition. Because it is unknown whether the patient takes chronic benzodiazepines Romazicon was not given. No one accompanied the patient to the ER.  After arrival to the ER the patient appeared to wake up more although remained nonverbal. He did have brief periods of hypoxemia prompting application initially of nasal cannula mask but then transitioning to a partial rebreathing/simple mask because of mouth breathing. This improved his saturations. He was quite hypertensive with blood pressure ranges between 161/121 and 177/120. He was afebrile and respiratory rate stayed between 20 and 26 breaths per minute, pulse was in the 90s and was sinus in etiology. EKG did reveal QT  prolongation of 505 ms. Chest x-ray was unremarkable. CT of the head without contrast has been ordered but has yet to be completed. An ABG was unremarkable except for hypoxemia PO2 of 67. Acid base excess was normal at 1 and bicarbonate was stable at 24.6. Initial I panel unremarkable except for mild hypokalemia of 3.6. LFTs were normal. Total CK was elevated at 131 but troponin was normal and lactic acid was normal at 1.99. Patient's glucose appeared to be suboptimal in the setting of acute stress and has been in the 70s. CBC unremarkable. She'll aspirin as well as Tylenol levels were within normal limits. Urinalysis was unremarkable. Alcohol level was less than 5. Urine drug screen positive for benzodiazepines and cocaine.  Upon my examination of the patient at some point it was determined that the patient's first name might be Jonathan Perez and he seemed to respond somewhat when that name was utilized to communicate with him. For the most part he appears to be hallucinating with his eyes and in upper gaze focused on the ceiling although he will track occupants around the room when noises are made or  he appreciates movement in the room. I discussed with the ER physician the possibility of obtaining an IVC. EDP was uncertain if this could be accomplished given the fact that we do not know the patient's name but she will explore that possibility. Currently there are no social workers available at the hospital to assist in accomplishing this task.   Review of Systems   Unable to obtain from patient based on acute encephalopathy and altered mentation   Social History Social History  Substance Use Topics  . Smoking status: Not on file  . Smokeless tobacco: Not on file  . Alcohol Use: Not on file    Resides at: Unclear-was found at trailer home in [facility] supposedly roommate in attendance  Lives with: Unknown  Ambulatory status: Unknown based on physical appearance patient does not have any lower  extremity atrophy or obvious skeletal muscular or neurological deficits that would prevent him from ambulating without difficulty   Family History Unable to obtain from patient due to significant altered mentation and no family or friends at bedside to assist   Prior to Admission medications   Not on File    Allergies not on file  Physical Exam  Vitals  Blood pressure 177/120, pulse 94, temperature 97 F (36.1 C), temperature source Oral, resp. rate 23, SpO2 96 %.   General: Age unknown but overall appears relatively healthy  Psych: Unable to complete exam appropriately given patient's inability to participate  Neuro:  No obvious focal neurological deficits but am limited by patient's inability to participate. DTRs are intact and patient noted with spontaneous movement when aggressively stimulated. Patient almost catatonic like and nonverbal; stares up at the ceiling as if he is hallucinating but we'll also track occupants in the room and appears to make brief eye contact when spoken to; at one point appeared to attempt to follow simple commands when asked to raise 2 fingers  ENT:  Ears and Eyes appear Normal, Conjunctivae clear, PER. Very dry oral mucosa without erythema or exudates.  Neck:  Supple, No lymphadenopathy appreciated  Respiratory:  Symmetrical chest wall movement, Good air movement bilaterally, CTAB. Room Air  Cardiac:  RRR, No Murmurs, no LE edema noted, no JVD, No carotid bruits, peripheral pulses palpable at 2+  Abdomen:  Diminished bowel sounds, Soft, Non tender, Non distended,  No masses appreciated, no obvious hepatosplenomegaly  Skin:  No Cyanosis, Normal Skin Turgor, has reddened faint irregular bruise type area on left lateral scalp-also has smaller bruises of various stages of maturity on the shoulders and anterior chest and abdominal wall as well as on the lower extremities and upper extremities. Extensive detailed skin art bilateral lower extremities  below the knees; also has horizontal superficial laceration to left wrist concerning for prior suicide attempt in the past week  Extremities: Symmetrical without obvious trauma or injury,  no effusions.  Data Review  CBC  Recent Labs Lab 10/22/15 1416 10/22/15 1428  WBC 10.3  --   HGB 17.6 17.3  HCT 50.7 51.0  PLT 203  --   MCV 98.3  --   MCH 34.1  --   MCHC 34.7  --   RDW 14.6  --   LYMPHSABS 2.3  --   MONOABS 0.7  --   EOSABS 0.3  --   BASOSABS 0.1  --     Chemistries   Recent Labs Lab 10/22/15 1416 10/22/15 1428  NA 140 145  K 3.6 3.5  CL 107 105  CO2 25  --  GLUCOSE 77 74  BUN 10 13  CREATININE 1.04 1.00  CALCIUM 9.2  --   AST 33  --   ALT 23  --   ALKPHOS 82  --   BILITOT 1.0  --     CrCl cannot be calculated (Only Male [1] and Male [2] are supported.).  No results for input(s): TSH, T4TOTAL, T3FREE, THYROIDAB in the last 72 hours.  Invalid input(s): FREET3  Coagulation profile  Recent Labs Lab 10/22/15 1416  INR 1.00    No results for input(s): DDIMER in the last 72 hours.  Cardiac Enzymes  Recent Labs Lab 10/22/15 1416  TROPONINI <0.03    Invalid input(s): POCBNP  Urinalysis    Component Value Date/Time   COLORURINE YELLOW 10/22/2015 1502   APPEARANCEUR CLEAR 10/22/2015 1502   LABSPEC 1.016 10/22/2015 1502   PHURINE 6.5 10/22/2015 1502   GLUCOSEU NEGATIVE 10/22/2015 1502   HGBUR LARGE 10/22/2015 1502   BILIRUBINUR NEGATIVE 10/22/2015 1502   KETONESUR NEGATIVE 10/22/2015 1502   PROTEINUR NEGATIVE 10/22/2015 1502   UROBILINOGEN 1.0 10/22/2015 1502   NITRITE NEGATIVE 10/22/2015 1502   LEUKOCYTESUR NEGATIVE 10/22/2015 1502    Imaging results:   Dg Chest Port 1 View  10/22/2015  CLINICAL DATA:  Overdose. EXAM: PORTABLE CHEST 1 VIEW COMPARISON:  None. FINDINGS: Lungs are adequately inflated without focal consolidation or effusion. Cardiomediastinal silhouette is within normal. Spinal stabilization hardware is intact.  IMPRESSION: No active disease. Electronically Signed   By: Elberta Fortis M.D.   On: 10/22/2015 16:27     EKG: (Independently reviewed) sinus rhythm with ventricular rate 92 bpm QTc slightly prolonged at 505 ms, prominent Q wave in lead 3 borderline voltage criteria for LVH elevated J point in leads 23 aVF and V6, no ischemic changes appreciated.   Assessment & Plan  Principal Problem:   Drug overdose/ Suicide attempt (HCC) -Admit to stepdown -Unknown drug ingested but symptoms appear consistent with anticholinergic -UDS positive for BZDs and cocaine -Aspirin and Tylenol levels initially normal -repeat Tylenol level pending -Not hyperthermic and not flushing so doubt significant anticholinergic ingestion -CK 131- rpt in am -Symptoms did not improve after administration of Narcan -Needs formal psych eval -Unable to obtain IVC at this juncture -Safety sitter -Monitor for urinary retention -IV fluids -Per Up-To-Date: Utilize BZDs agitation and for suspected anticholinergic overdose   Active Problems:   Hypertensive urgency -Unknown if underlying hypertensive disorder -Hydralazine 1 and monitor    Acute respiratory failure with hypoxia (HCC) -Repeat ABG now -Supportive care with oxygen -Currently able to maintain airway -Continue nasal trumpet    Metabolic encephalopathy -Secondary to drug overdose -CT head pending -Neuro cks q 2hrs    QT prolongation -Suspect secondary to presumed anticholinergic overdose -EKG q 6hrs -Check magnesium -Keep potassium >/= 4.0 -Avoid offending medications such as Haldol, Zofran and fluoroquinolones    Acute hypokalemia/relative hypoglycemia -IV repletion of potassium (maintenance fluids and boluses) -Dextrose containing maintenance fluid -ck CBGs q 2hrs    Cocaine abuse  -Positive UDS -Avoid beta blockers    DVT Prophylaxis: SCDs until CT head returned and no evidence of intracranial bleeding  Family Communication: No family at  bedside and at this juncture patient identity unclear    Code Status:  Full code  Condition:  Guarded  Discharge disposition: Anticipate will require inpatient psychiatric treatment with possible involuntary commitment  Time spent in minutes : 60      Dashay Giesler L. ANP on 10/22/2015 at 5:40 PM  Between 7am  to 7pm - Pager - [phone]  After 7pm go to [email or web address] - password [email or web address]  And look for the night coverage person covering me after hours  [facility]

## 2015-10-22 NOTE — ED Notes (Signed)
MD notified about CT results and BP.

## 2015-10-22 NOTE — ED Notes (Addendum)
Per EMS, pt called EMS due overdosing on over 100 sleeping pills called "sleep like your dead." EMS reports that the pt had many episodes of apnea in route and had to have assisted ventilations multiple times. NPA placed by EMS. Pt only responsive to pain. MD at the bedside. EMS gave 2mg  of narcan with no chang\e.

## 2015-10-22 NOTE — ED Notes (Signed)
CBG 78. 

## 2015-10-22 NOTE — ED Notes (Signed)
Spoke to Fel[place]heryl from poison control, updated her on pt status, acetaminophen and salicylate level, and vital signs.

## 2015-10-22 NOTE — ED Provider Notes (Signed)
CSN: [id number]     Arrival date & time 10/22/15  1400 History   First MD Initiated Contact with Patient 10/22/15 1405     Chief Complaint  Patient presents with  . Drug Overdose     Patient is a 39 y.o. unknown presenting with Overdose. The history is provided by the EMS personnel. No language interpreter was used.  Drug Overdose   Patient presents as a drug overdose. Level 5 caveat due to patient being non-verbal. Per EMS reports the patient called 911 stating he was going to kill himself by overdosing on 100 day to sleep sleeping pills. Upon EMS arrival they state that he was alternatively combative and apneic. They at times had to perform valve mask ventilations. Patient comes from a trailer and [facility] and had a roommate but did not know what the patient took over the patient's name. EMS reported on scene that there was a poor container that was empty that is assumed to have had the medications inside of it. Patient received Narcan by EMS with no response.  No past medical history on file. No past surgical history on file. No family history on file. Social History  Substance Use Topics  . Smoking status: Not on file  . Smokeless tobacco: Not on file  . Alcohol Use: Not on file   OB History    No data available     Review of Systems  Unable to perform ROS: Patient nonverbal      Allergies  Review of patient's allergies indicates not on file.  Home Medications   Prior to Admission medications   Not on File   BP 166/115 mmHg  Pulse 93  Temp(Src) 97 F (36.1 C) (Oral)  Resp 26  SpO2 95% Physical Exam  Constitutional: He appears well-developed and well-nourished. He appears distressed.  HENT:  Head: Normocephalic and atraumatic.  Dry mucous membranes  Eyes: Pupils are equal, round, and reactive to light.  Pupils midsized and reactive bilaterally. Occasional horizontal nystagmus  Cardiovascular: Normal rate and regular rhythm.   No murmur  heard. Pulmonary/Chest: Effort normal and breath sounds normal. No respiratory distress.  Abdominal: Soft. There is no tenderness. There is no rebound and no guarding.  Decreased bowel sounds  Musculoskeletal: He exhibits no edema or tenderness.  Healing laceration to the left wrist. Multiple bruises over the upper chest and lower abdomen that appear to be healing.  Neurological:  Alert, eyes open spontaneously and appear to be responding to stimuli that are not present. Localizes to pain. Gag intact. Nonverbal. Increase tone. Occasionally responds and localizes to verbal stimuli. Does not follow commands.  Skin: Skin is warm and dry.  Psychiatric:  Unable to assess  Nursing note and vitals reviewed.   ED Course  Procedures (including critical care time) CRITICAL CARE Performed by: Tilden Fossa   Total critical care time: 30 minutes  Critical care time was exclusive of separately billable procedures and treating other patients.  Critical care was necessary to treat or prevent imminent or life-threatening deterioration.  Critical care was time spent personally by me on the following activities: development of treatment plan with patient and/or surrogate as well as nursing, discussions with consultants, evaluation of patient's response to treatment, examination of patient, obtaining history from patient or surrogate, ordering and performing treatments and interventions, ordering and review of laboratory studies, ordering and review of radiographic studies, pulse oximetry and re-evaluation of patient's condition.  Labs Review Labs Reviewed  COMPREHENSIVE METABOLIC PANEL  ACETAMINOPHEN  LEVEL  ETHANOL  TROPONIN I  CBC WITH DIFFERENTIAL/PLATELET  PROTIME-INR  URINE RAPID DRUG SCREEN, HOSP PERFORMED  URINALYSIS, ROUTINE W REFLEX MICROSCOPIC (NOT AT [facility])  SALICYLATE LEVEL  URINE MICROSCOPIC-ADD ON  BLOOD GAS, ARTERIAL  CK  I-STAT CG4 LACTIC ACID, ED  I-STAT CHEM 8, ED  I-STAT  ARTERIAL BLOOD GAS, ED    Imaging Review Dg Chest Port 1 View  10/22/2015  CLINICAL DATA:  Overdose. EXAM: PORTABLE CHEST 1 VIEW COMPARISON:  None. FINDINGS: Lungs are adequately inflated without focal consolidation or effusion. Cardiomediastinal silhouette is within normal. Spinal stabilization hardware is intact. IMPRESSION: No active disease. Electronically Signed   By: Elberta Fortisaniel  Boyle M.D.   On: 10/22/2015 16:27   I have personally reviewed and evaluated these images and lab results as part of my medical decision-making.   EKG Interpretation None      MDM   Final diagnoses:  Overdose, intentional self-harm, initial encounter [facility](HCC)    Patient here following drug overdose with unknown ingestion. Patient appears to have mixed toxidrome on examination, currently protecting his airway. Plan to admit for observation followed by psychiatric evaluation.  Patient continues to maintain his airway on repeat examinations in the emergency department. Plan to admit to the medicine service for monitoring and the stepdown unit.    Tilden FossaElizabeth Treyshon Buchanon, MD 10/22/15 1735

## 2015-10-23 ENCOUNTER — Other Ambulatory Visit: Payer: Self-pay

## 2015-10-23 ENCOUNTER — Encounter (HOSPITAL_COMMUNITY): Payer: Self-pay | Admitting: *Deleted

## 2015-10-23 DIAGNOSIS — I160 Hypertensive urgency: Secondary | ICD-10-CM

## 2015-10-23 DIAGNOSIS — T50902A Poisoning by unspecified drugs, medicaments and biological substances, intentional self-harm, initial encounter: Principal | ICD-10-CM

## 2015-10-23 LAB — GLUCOSE, CAPILLARY
GLUCOSE-CAPILLARY: 97 mg/dL (ref 65–99)
Glucose-Capillary: 78 mg/dL (ref 65–99)
Glucose-Capillary: 86 mg/dL (ref 65–99)
Glucose-Capillary: 99 mg/dL (ref 65–99)
Glucose-Capillary: 105 mg/dL — ABNORMAL HIGH (ref 65–99)
Glucose-Capillary: 99 mg/dL (ref 65–99)

## 2015-10-23 LAB — URINE MICROSCOPIC-ADD ON

## 2015-10-23 LAB — POCT I-STAT 3, ART BLOOD GAS (G3+)
ACID-BASE EXCESS: 1 mmol/L (ref 0.0–2.0)
Acid-Base Excess: 3 mmol/L — ABNORMAL HIGH (ref 0.0–2.0)
BICARBONATE: 24.6 meq/L — AB (ref 20.0–24.0)
BICARBONATE: 26.6 meq/L — AB (ref 20.0–24.0)
O2 SAT: 99 %
O2 Saturation: 95 %
PCO2 ART: 36.2 mmHg (ref 35.0–45.0)
PH ART: 7.456 — AB (ref 7.350–7.450)
TCO2: 26 mmol/L (ref 0–100)
TCO2: 28 mmol/L (ref 0–100)
pCO2 arterial: 34.6 mmHg — ABNORMAL LOW (ref 35.0–45.0)
pH, Arterial: 7.471 — ABNORMAL HIGH (ref 7.350–7.450)
pO2, Arterial: 67 mmHg — ABNORMAL LOW (ref 80.0–100.0)
pO2, Arterial: 116 mmHg — ABNORMAL HIGH (ref 80.0–100.0)

## 2015-10-23 LAB — RAPID URINE DRUG SCREEN, HOSP PERFORMED
Amphetamines: NOT DETECTED
BARBITURATES: NOT DETECTED
BENZODIAZEPINES: POSITIVE — AB
Cocaine: POSITIVE — AB
Opiates: NOT DETECTED
TETRAHYDROCANNABINOL: NOT DETECTED

## 2015-10-23 LAB — CBC WITH DIFFERENTIAL/PLATELET
Basophils Absolute: 0.1 10*3/uL (ref 0.0–0.1)
Basophils Relative: 1 %
EOS ABS: 0.3 10*3/uL (ref 0.0–0.7)
EOS PCT: 3 %
HCT: 50.7 % (ref 39.0–52.0)
HEMOGLOBIN: 17.6 g/dL — AB (ref 13.0–17.0)
LYMPHS ABS: 2.3 10*3/uL (ref 0.7–4.0)
LYMPHS PCT: 22 %
MCH: 34.1 pg — AB (ref 26.0–34.0)
MCHC: 34.7 g/dL (ref 30.0–36.0)
MCV: 98.3 fL (ref 78.0–100.0)
MONOS PCT: 7 %
Monocytes Absolute: 0.7 10*3/uL (ref 0.1–1.0)
NEUTROS PCT: 67 %
Neutro Abs: 7 10*3/uL (ref 1.7–7.7)
Platelets: 203 10*3/uL (ref 150–400)
RBC: 5.16 MIL/uL (ref 4.22–5.81)
RDW: 14.6 % (ref 11.5–15.5)
WBC: 10.3 10*3/uL (ref 4.0–10.5)

## 2015-10-23 LAB — POCT I-STAT, CHEM 8
BUN: 13 mg/dL (ref 6–20)
CALCIUM ION: 1.11 mmol/L — AB (ref 1.12–1.23)
Chloride: 105 mmol/L (ref 101–111)
Creatinine, Ser: 1 mg/dL (ref 0.61–1.24)
Glucose, Bld: 74 mg/dL (ref 65–99)
HEMATOCRIT: 51 % (ref 39.0–52.0)
HEMOGLOBIN: 17.3 g/dL — AB (ref 13.0–17.0)
Potassium: 3.5 mmol/L (ref 3.5–5.1)
SODIUM: 145 mmol/L (ref 135–145)
TCO2: 23 mmol/L (ref 0–100)

## 2015-10-23 LAB — COMPREHENSIVE METABOLIC PANEL
ALBUMIN: 4.2 g/dL (ref 3.5–5.0)
ALT: 23 U/L (ref 17–63)
ALT: 21 U/L (ref 17–63)
ANION GAP: 8 (ref 5–15)
AST: 33 U/L (ref 15–41)
AST: 23 U/L (ref 15–41)
Albumin: 3.8 g/dL (ref 3.5–5.0)
Alkaline Phosphatase: 82 U/L (ref 38–126)
Alkaline Phosphatase: 68 U/L (ref 38–126)
Anion gap: 7 (ref 5–15)
BUN: 10 mg/dL (ref 6–20)
BUN: 7 mg/dL (ref 6–20)
CALCIUM: 9.2 mg/dL (ref 8.9–10.3)
CHLORIDE: 107 mmol/L (ref 101–111)
CHLORIDE: 106 mmol/L (ref 101–111)
CO2: 25 mmol/L (ref 22–32)
CO2: 24 mmol/L (ref 22–32)
CREATININE: 0.86 mg/dL (ref 0.61–1.24)
Calcium: 8.5 mg/dL — ABNORMAL LOW (ref 8.9–10.3)
Creatinine, Ser: 1.04 mg/dL (ref 0.61–1.24)
GLUCOSE: 77 mg/dL (ref 65–99)
Glucose, Bld: 110 mg/dL — ABNORMAL HIGH (ref 65–99)
POTASSIUM: 3.6 mmol/L (ref 3.5–5.1)
Potassium: 3.7 mmol/L (ref 3.5–5.1)
SODIUM: 140 mmol/L (ref 135–145)
SODIUM: 137 mmol/L (ref 135–145)
Total Bilirubin: 1 mg/dL (ref 0.3–1.2)
Total Bilirubin: 1 mg/dL (ref 0.3–1.2)
Total Protein: 7.2 g/dL (ref 6.5–8.1)
Total Protein: 6.1 g/dL — ABNORMAL LOW (ref 6.5–8.1)

## 2015-10-23 LAB — URINALYSIS, ROUTINE W REFLEX MICROSCOPIC
BILIRUBIN URINE: NEGATIVE
GLUCOSE, UA: NEGATIVE mg/dL
KETONES UR: NEGATIVE mg/dL
LEUKOCYTES UA: NEGATIVE
Nitrite: NEGATIVE
PROTEIN: NEGATIVE mg/dL
Specific Gravity, Urine: 1.016 (ref 1.005–1.030)
Urobilinogen, UA: 1 mg/dL (ref 0.0–1.0)
pH: 6.5 (ref 5.0–8.0)

## 2015-10-23 LAB — PROTIME-INR
INR: 1 (ref 0.00–1.49)
PROTHROMBIN TIME: 13.4 s (ref 11.6–15.2)

## 2015-10-23 LAB — CBC
HCT: 45.6 % (ref 39.0–52.0)
Hemoglobin: 15.3 g/dL (ref 13.0–17.0)
MCH: 32.9 pg (ref 26.0–34.0)
MCHC: 33.6 g/dL (ref 30.0–36.0)
MCV: 98.1 fL (ref 78.0–100.0)
PLATELETS: 231 10*3/uL (ref 150–400)
RBC: 4.65 MIL/uL (ref 4.22–5.81)
RDW: 14.7 % (ref 11.5–15.5)
WBC: 14.2 10*3/uL — ABNORMAL HIGH (ref 4.0–10.5)

## 2015-10-23 LAB — SALICYLATE LEVEL: Salicylate Lvl: <4 mg/dL (ref 2.8–30.0)

## 2015-10-23 LAB — CG4 I-STAT (LACTIC ACID): LACTIC ACID, VENOUS: 1.99 mmol/L (ref 0.5–2.0)

## 2015-10-23 LAB — ETHANOL: Alcohol, Ethyl (B): <5 mg/dL (ref <5)

## 2015-10-23 LAB — ACETAMINOPHEN LEVEL

## 2015-10-23 LAB — MAGNESIUM: Magnesium: 1.9 mg/dL (ref 1.7–2.4)

## 2015-10-23 LAB — CK
Total CK: 131 U/L (ref 49–397)
Total CK: 69 U/L (ref 49–397)

## 2015-10-23 LAB — TROPONIN I: Troponin I: <0.03 ng/mL (ref <0.031)

## 2015-10-23 MED ORDER — AMPHETAMINE-DEXTROAMPHETAMINE 10 MG PO TABS
20.0000 mg | ORAL_TABLET | Freq: Two times a day (BID) | ORAL | Status: DC
  Administered 2015-10-24 – 2015-10-25 (×3): 20 mg via ORAL
  Filled 2015-10-23 (×4): qty 2

## 2015-10-23 MED ORDER — CHLORHEXIDINE GLUCONATE CLOTH 2 % EX PADS
6.0000 | MEDICATED_PAD | Freq: Every day | CUTANEOUS | Status: DC
  Administered 2015-10-23 – 2015-10-25 (×3): 6 via TOPICAL

## 2015-10-23 MED ORDER — MUPIROCIN 2 % EX OINT
1.0000 "application " | TOPICAL_OINTMENT | Freq: Two times a day (BID) | CUTANEOUS | Status: DC
  Administered 2015-10-23 – 2015-10-25 (×5): 1 via NASAL
  Filled 2015-10-23 (×2): qty 22

## 2015-10-23 MED ORDER — POTASSIUM CHLORIDE IN NACL 20-0.9 MEQ/L-% IV SOLN
INTRAVENOUS | Status: DC
  Administered 2015-10-23: 16:00:00 via INTRAVENOUS
  Filled 2015-10-23 (×3): qty 1000

## 2015-10-23 MED ORDER — TRAMADOL HCL 50 MG PO TABS
50.0000 mg | ORAL_TABLET | Freq: Four times a day (QID) | ORAL | Status: DC | PRN
  Administered 2015-10-23 – 2015-10-24 (×3): 50 mg via ORAL
  Filled 2015-10-23 (×3): qty 1

## 2015-10-23 MED ORDER — LIDOCAINE 5 % EX PTCH
1.0000 | MEDICATED_PATCH | CUTANEOUS | Status: DC
  Administered 2015-10-23 – 2015-10-24 (×2): 1 via TRANSDERMAL
  Filled 2015-10-23 (×2): qty 1

## 2015-10-23 MED ORDER — ACETAMINOPHEN 500 MG PO TABS: 500.0000 mg | ORAL_TABLET | Freq: Four times a day (QID) | ORAL | Status: DC | PRN

## 2015-10-23 NOTE — Progress Notes (Signed)
Patient called for nurse requesting stronger pain medications. Nurse reminded patient of previous conversation with the MD stating that we will hold off on any narcotics due to overdose and patient has been lethargic all day and continues to be lethargic intermittently. Explained to patient that it is not time for more pain meds. Patient upset stating that he needs to talk to MD right now , this nurse reminded patient of conversation that he just had with MD about pain meds. Will continue to monitor the patient

## 2015-10-23 NOTE — Progress Notes (Signed)
[place] TEAM 1 - Stepdown/ICU TEAM PROGRESS NOTE  Jonathan Perez ZOX:[id number] DOB: 02-12-1976 DOA: 10/22/2015 PCP: No primary care provider on file.  Admit HPI / Brief Narrative: 39 yo male who at time of presentation was of unknown age or name, who came in by EMS with reported intentional drug overdose. Per EMS the patient called 911 claiming that he was going to kill himself by overdosing on 100 over-the-counter sleeping pills. Upon their arrival to the patient's residence the patient was alternatively combative and apneic. At times they had to perform BVM ventilatory support. The patient apparently was residing in a [place] home in [facility]; there was a roommate present who was unable to tell EMS exactly what type of medications the patient had consumed and was unable to tell them the patient's name or provide any documentation of the patient's identity. Patient was given 2 mg IV Narcan by EMS with no change in condition. Because it is unknown whether the patient takes chronic benzodiazepines Romazicon was not given. No one accompanied the patient to the ER.  After arrival to the ER the patient appeared to wake up more although remained nonverbal. He did have brief periods of hypoxemia prompting application initially of nasal cannula mask but then transitioning to a partial rebreathing/simple mask because of mouth breathing.  An ABG was unremarkable.  LFTs were normal. Total CK was elevated at 131 but troponin was normal and lactic acid was normal. Aspirin as well as Tylenol levels were within normal limits. Urinalysis was unremarkable. Alcohol level was less than 5. Urine drug screen positive for benzodiazepines and cocaine.  HPI/Subjective: Patient is now alert and oriented.  He tells me he was "feeling overwhelmed by life and everything on him" and that he then took 15-20 of his Coreg.  He denies taking anything else.  He is presently complaining of severe low back pain and ask for  pain medications.  He informs me he is allergic to Toradol.  He denies shortness of breath chest pain nausea or vomiting.  Assessment/Plan:  Drug overdose/ Suicide attempt  ASA and APAP levels undetectable - UDS + for benzos and cocaine - pt admitted to [facility] for same in October 2016 - pt claims he OD on BB again, but clinically this does not seem likely as neither bradycardia nor hypotension have been an issue    Hypertensive urgency BP now well controlled - follow trend - avoid BB for now   Toxic metabolic encephalopathy CT head w/o acute findings - due to toxic ingestion of unknown substance - appears to be clearing rapidly   Prolonged QT Resolved as of most recent EKG at 13:13 today   Bipolar d/o Psych to be consulted in AM for evaluation  Hypokalemia  K+ acceptable presently - follow  CKD Baseline crt 1.4-1.6  Cocaine abuse   Chronic back pain  Add analgesic, but avoid narcotic for now - explained to pt why we must avoid sedating meds for now   MRSA screen +  Code Status: FULL Family Communication: no family present at time of exam Disposition Plan: SDU  Consultants: none  Procedures: none  Antibiotics: none  DVT prophylaxis: SCDs  Objective: Blood pressure 135/79, pulse 68, temperature 97.8 F (36.6 C), temperature source Axillary, resp. rate 22, weight 102.8 kg (226 lb 10.1 oz), SpO2 99 %.  Intake/Output Summary (Last 24 hours) at 10/23/15 1340 Last data filed at 10/23/15 0700  Gross per 24 hour  Intake   2510 ml  Output    900 ml  Net   1610 ml   Exam: General: No acute respiratory distress Lungs: Clear to auscultation bilaterally without wheezes or crackles Cardiovascular: Regular rate and rhythm without murmur gallop or rub normal S1 and S2 Abdomen: Nontender, nondistended, soft, bowel sounds positive, no rebound, no ascites, no appreciable mass Extremities: No significant cyanosis, clubbing, or edema bilateral lower extremities  Data  Reviewed: Basic Metabolic Panel:  Recent Labs Lab 10/22/15 1416 10/22/15 1428 10/23/15 0245  NA 140 145  145 137  K 3.6 3.5  3.5 3.7  CL 107 105  105 106  CO2 25  --  24  GLUCOSE 77 74  74 110*  BUN 10 13  13 7   CREATININE 1.04 1.00  1.00 0.86  CALCIUM 9.2  --  8.5*  MG 1.9  --   --     CBC:  Recent Labs Lab 10/22/15 1416 10/22/15 1428 10/23/15 0245  WBC 10.3  --  14.2*  NEUTROABS 7.0  --   --   HGB 17.6* 17.3*  17.3* 15.3  HCT 50.7 51.0  51.0 45.6  MCV 98.3  --  98.1  PLT 203  --  231    Liver Function Tests:  Recent Labs Lab 10/22/15 1416 10/23/15 0245  AST 33 23  ALT 23 21  ALKPHOS 82 68  BILITOT 1.0 1.0  PROT 7.2 6.1*  ALBUMIN 4.2 3.8   Coags:  Recent Labs Lab 10/22/15 1416  INR 1.00   Cardiac Enzymes:  Recent Labs Lab 10/22/15 1416 10/23/15 0245  CKTOTAL 131 69  TROPONINI <0.03  --     CBG:  Recent Labs Lab 10/22/15 2106 10/22/15 2355 10/23/15 0357 10/23/15 0748 10/23/15 1154  GLUCAP 86 99 105* 99 97    Recent Results (from the past 240 hour(s))  MRSA PCR Screening     Status: Abnormal   Collection Time: 10/22/15  9:19 PM  Result Value Ref Range Status   MRSA by PCR POSITIVE (A) NEGATIVE Final    Comment:        The GeneXpert MRSA Assay (FDA approved for NASAL specimens only), is one component of a comprehensive MRSA colonization surveillance program. It is not intended to diagnose MRSA infection nor to guide or monitor treatment for MRSA infections. RESULT CALLED TO, READ BACK BY AND VERIFIED WITH: RN ZARSONA R. [id number] @2323  THANEY      Studies:   Recent x-ray studies have been reviewed in detail by the Attending Physician  Scheduled Meds:  Scheduled Meds: . antiseptic oral rinse  7 mL Mouth Rinse BID  . Chlorhexidine Gluconate Cloth  6 each Topical Q0600  . folic acid  1 mg Oral Daily  . insulin aspart  0-9 Units Subcutaneous 6 times per day  . multivitamin with minerals  1 tablet Oral Daily  .  mupirocin ointment  1 application Nasal BID  . sodium chloride  3 mL Intravenous Q12H  . thiamine  100 mg Oral Daily   Or  . thiamine  100 mg Intravenous Daily    Time spent on care of this patient: 35 mins   Donivin Wirt T , MD   [facility] Office  [phone] Pager - Text Page per Loretha StaplerAmion as per below:  On-Call/Text Page:      [email or web address]      password [email or web address]  If 7PM-7AM, please contact night-coverage [email or web address] Password [email or web address] 10/23/2015, 1:40 PM   LOS: 1 day

## 2015-10-24 DIAGNOSIS — T502X2A Poisoning by carbonic-anhydrase inhibitors, benzothiadiazides and other diuretics, intentional self-harm, initial encounter: Secondary | ICD-10-CM

## 2015-10-24 DIAGNOSIS — F1410 Cocaine abuse, uncomplicated: Secondary | ICD-10-CM

## 2015-10-24 DIAGNOSIS — R45851 Suicidal ideations: Secondary | ICD-10-CM

## 2015-10-24 DIAGNOSIS — T1491 Suicide attempt: Secondary | ICD-10-CM

## 2015-10-24 LAB — COMPREHENSIVE METABOLIC PANEL
ALT: 18 U/L (ref 17–63)
AST: 18 U/L (ref 15–41)
Albumin: 3.7 g/dL (ref 3.5–5.0)
Alkaline Phosphatase: 60 U/L (ref 38–126)
Anion gap: 8 (ref 5–15)
BILIRUBIN TOTAL: 1.2 mg/dL (ref 0.3–1.2)
BUN: 7 mg/dL (ref 6–20)
CHLORIDE: 105 mmol/L (ref 101–111)
CO2: 25 mmol/L (ref 22–32)
CREATININE: 0.98 mg/dL (ref 0.61–1.24)
Calcium: 8.9 mg/dL (ref 8.9–10.3)
Glucose, Bld: 95 mg/dL (ref 65–99)
POTASSIUM: 3.7 mmol/L (ref 3.5–5.1)
Sodium: 138 mmol/L (ref 135–145)
TOTAL PROTEIN: 6.3 g/dL — AB (ref 6.5–8.1)

## 2015-10-24 LAB — HEMOGLOBIN A1C
HEMOGLOBIN A1C: 5.2 % (ref 4.8–5.6)
MEAN PLASMA GLUCOSE: 103 mg/dL

## 2015-10-24 LAB — CBC
HCT: 45.8 % (ref 39.0–52.0)
Hemoglobin: 15.4 g/dL (ref 13.0–17.0)
MCH: 33 pg (ref 26.0–34.0)
MCHC: 33.6 g/dL (ref 30.0–36.0)
MCV: 98.3 fL (ref 78.0–100.0)
PLATELETS: 227 10*3/uL (ref 150–400)
RBC: 4.66 MIL/uL (ref 4.22–5.81)
RDW: 14.4 % (ref 11.5–15.5)
WBC: 13.5 10*3/uL — AB (ref 4.0–10.5)

## 2015-10-24 LAB — GLUCOSE, CAPILLARY
GLUCOSE-CAPILLARY: 83 mg/dL (ref 65–99)
GLUCOSE-CAPILLARY: 77 mg/dL (ref 65–99)
GLUCOSE-CAPILLARY: 93 mg/dL (ref 65–99)
GLUCOSE-CAPILLARY: 94 mg/dL (ref 65–99)
Glucose-Capillary: 95 mg/dL (ref 65–99)

## 2015-10-24 LAB — PHOSPHORUS: PHOSPHORUS: 2.2 mg/dL — AB (ref 2.5–4.6)

## 2015-10-24 LAB — MAGNESIUM: Magnesium: 1.5 mg/dL — ABNORMAL LOW (ref 1.7–2.4)

## 2015-10-24 MED ORDER — OXYCODONE HCL 5 MG PO TABS
5.0000 mg | ORAL_TABLET | ORAL | Status: DC | PRN
  Administered 2015-10-24 – 2015-10-25 (×3): 10 mg via ORAL
  Administered 2015-10-25: 5 mg via ORAL
  Administered 2015-10-25: 10 mg via ORAL
  Filled 2015-10-24 (×4): qty 2
  Filled 2015-10-24: qty 1

## 2015-10-24 MED ORDER — TRAMADOL HCL 50 MG PO TABS
50.0000 mg | ORAL_TABLET | Freq: Four times a day (QID) | ORAL | Status: DC | PRN
  Administered 2015-10-24: 50 mg via ORAL
  Filled 2015-10-24: qty 1

## 2015-10-24 MED ORDER — K PHOS MONO-SOD PHOS DI & MONO 155-852-130 MG PO TABS
250.0000 mg | ORAL_TABLET | Freq: Once | ORAL | Status: AC
  Administered 2015-10-24: 250 mg via ORAL
  Filled 2015-10-24: qty 1

## 2015-10-24 MED ORDER — MAGNESIUM SULFATE IN D5W 10-5 MG/ML-% IV SOLN
1.0000 g | Freq: Once | INTRAVENOUS | Status: AC
  Administered 2015-10-24: 1 g via INTRAVENOUS
  Filled 2015-10-24: qty 100

## 2015-10-24 NOTE — Progress Notes (Signed)
[place] TEAM 1 - Stepdown/ICU TEAM PROGRESS NOTE  Jonathan Perez ZOX:[id number] DOB: 25-Dec-1975 DOA: 10/22/2015 PCP: No primary care provider on file.  Admit HPI / Brief Narrative: 39 yo male who at time of presentation was of unknown age or name, who came in by EMS with reported intentional drug overdose. Per EMS the patient called 911 claiming that he was going to kill himself by overdosing on 100 over-the-counter sleeping pills. Upon their arrival to the patient's residence the patient was alternatively combative and apneic. At times they had to perform BVM ventilatory support. The patient apparently was residing in a trailer home in [facility]; there was a roommate present who was unable to tell EMS exactly what type of medications the patient had consumed and was unable to tell them the patient's name or provide any documentation of the patient's identity. Patient was given 2 mg IV Narcan by EMS with no change in condition. Because it is unknown whether the patient takes chronic benzodiazepines Romazicon was not given. No one accompanied the patient to the ER.  After arrival to the ER the patient appeared to wake up more although remained nonverbal. He did have brief periods of hypoxemia prompting application initially of nasal cannula mask but then transitioning to a partial rebreathing/simple mask because of mouth breathing.  An ABG was unremarkable.  LFTs were normal. Total CK was elevated at 131 but troponin was normal and lactic acid was normal. Aspirin as well as Tylenol levels were within normal limits. Urinalysis was unremarkable. Alcohol level was less than 5. Urine drug screen positive for benzodiazepines and cocaine.  HPI/Subjective: The patient's only complaint today is unrelenting back pain.  He is alert and oriented and conversant.  He states he understands he needs psychiatric help and is willing to be admitted to an inpatient unit as recommended by psychiatry.  He denies  chest pain shortness of breath abdominal pain nausea or vomiting.  Assessment/Plan:  Drug overdose/ Suicide attempt  ASA and APAP levels undetectable - UDS + for benzos and cocaine - pt admitted to [facility] for same in October 2016 - pt claims he OD on BB again, but clinically this does not seem likely as neither bradycardia nor hypotension have been an issue - patient appears stable at this time with no ongoing deleterious effects of his toxic ingestion of an unknown type - psychiatry has recommended inpatient treatment and the patient agrees - nonetheless, should he change his mind and refused he will need to be involuntarily committed as he has clearly presented a danger to himself - accordingly he should not be allowed to leave AMA   Hypertensive urgency BP now well controlled   Toxic metabolic encephalopathy CT head w/o acute findings - due to toxic ingestion of unknown substance - resolved   Prolonged QT Resolved   Bipolar d/o Psych suggest inpatient psychiatric treatment  Hypokalemia  K+ has normalized  CKD Baseline crt 1.4-1.6 - crt presently better than his baseline   Cocaine abuse  Patient counseled on the absolute need to discontinue cocaine abuse  Chronic back pain  With mental status improving and evidence of legitimate back pain I will initiate narcotic analgesic and follow for symptom control - we will monitor closely for manipulative or drug seeking behavior   MRSA screen +  Code Status: FULL Family Communication: no family present at time of exam Disposition Plan: Stable for transfer to medical bed with bedside sitter while awaiting inpatient psychiatric admission  Consultants: Psych  Procedures: none  Antibiotics: none  DVT prophylaxis: SCDs  Objective: Blood pressure 158/104, pulse 59, temperature 98.1 F (36.7 C), temperature source Oral, resp. rate 16, height 6' (1.829 m), weight 102.8 kg (226 lb 10.1 oz), SpO2 95 %.  Intake/Output Summary (Last  24 hours) at 10/24/15 0905 Last data filed at 10/24/15 0800  Gross per 24 hour  Intake   1350 ml  Output   1500 ml  Net   -150 ml   Exam: General: No acute respiratory distress Lungs: Clear to auscultation bilaterally  Cardiovascular: Regular rate and rhythm without murmur gallop or rub  Abdomen: Nontender, nondistended, soft, bowel sounds positive, no rebound, no ascites, no appreciable mass Extremities: No significant cyanosis, clubbing, edema bilateral lower extremities  Data Reviewed: Basic Metabolic Panel:  Recent Labs Lab 10/22/15 1416 10/22/15 1428 10/23/15 0245 10/24/15 0437  NA 140 145  145 137 138  K 3.6 3.5  3.5 3.7 3.7  CL 107 105  105 106 105  CO2 25  --  24 25  GLUCOSE 77 74  74 110* 95  BUN 10 13  13 7 7   CREATININE 1.04 1.00  1.00 0.86 0.98  CALCIUM 9.2  --  8.5* 8.9  MG 1.9  --   --  1.5*  PHOS  --   --   --  2.2*    CBC:  Recent Labs Lab 10/22/15 1416 10/22/15 1428 10/23/15 0245 10/24/15 0437  WBC 10.3  --  14.2* 13.5*  NEUTROABS 7.0  --   --   --   HGB 17.6* 17.3*  17.3* 15.3 15.4  HCT 50.7 51.0  51.0 45.6 45.8  MCV 98.3  --  98.1 98.3  PLT 203  --  231 227    Liver Function Tests:  Recent Labs Lab 10/22/15 1416 10/23/15 0245 10/24/15 0437  AST 33 23 18  ALT 23 21 18   ALKPHOS 82 68 60  BILITOT 1.0 1.0 1.2  PROT 7.2 6.1* 6.3*  ALBUMIN 4.2 3.8 3.7   Coags:  Recent Labs Lab 10/22/15 1416  INR 1.00   Cardiac Enzymes:  Recent Labs Lab 10/22/15 1416 10/23/15 0245  CKTOTAL 131 69  TROPONINI <0.03  --     CBG:  Recent Labs Lab 10/23/15 0748 10/23/15 1154 10/23/15 1943 10/23/15 2329 10/24/15 0407  GLUCAP 99 97 77 95 93    Recent Results (from the past 240 hour(s))  MRSA PCR Screening     Status: Abnormal   Collection Time: 10/22/15  9:19 PM  Result Value Ref Range Status   MRSA by PCR POSITIVE (A) NEGATIVE Final    Comment:        The GeneXpert MRSA Assay (FDA approved for NASAL specimens only), is  one component of a comprehensive MRSA colonization surveillance program. It is not intended to diagnose MRSA infection nor to guide or monitor treatment for MRSA infections. RESULT CALLED TO, READ BACK BY AND VERIFIED WITH: RN Loleta RoseZARSONA R. [id number] @2323  THANEY      Studies:   Recent x-ray studies have been reviewed in detail by the Attending Physician  Scheduled Meds:  Scheduled Meds: . amphetamine-dextroamphetamine  20 mg Oral BID  . antiseptic oral rinse  7 mL Mouth Rinse BID  . Chlorhexidine Gluconate Cloth  6 each Topical Q0600  . folic acid  1 mg Oral Daily  . insulin aspart  0-9 Units Subcutaneous 6 times per day  . lidocaine  1 patch Transdermal Q24H  . multivitamin with minerals  1 tablet Oral Daily  . mupirocin ointment  1 application Nasal BID  . thiamine  100 mg Oral Daily    Time spent on care of this patient: 35 mins   Edelyn Heidel T , MD   [facility] Office  [phone] Pager - Text Page per Loretha Stapler as per below:  On-Call/Text Page:      [email or web address]      password [email or web address]  If 7PM-7AM, please contact night-coverage [email or web address] Password [email or web address] 10/24/2015, 9:05 AM   LOS: 2 days

## 2015-10-24 NOTE — Care Management Note (Signed)
Case Management Note  Patient Details  Name: Ardis HughsDavid B [place] MRN: [id number] Date of Birth: 06/08/1976  Subjective/Objective:     Adm w drug overdose               Action/Plan: lives at home   Expected Discharge Date:                 Expected Discharge Plan:     In-House Referral:     Discharge planning Services     Post Acute Care Choice:    Choice offered to:     DME Arranged:    DME Agency:     HH Arranged:    HH Agency:     Status of Service:     Medicare Important Message Given:    Date Medicare IM Given:    Medicare IM give by:    Date Additional Medicare IM Given:    Additional Medicare Important Message give by:     If discussed at Long Length of Stay Meetings, dates discussed:    Additional Comments: ur review done  Hanley HaysDowell, Maryclare Nydam T, RN 10/24/2015, 8:59 AM

## 2015-10-24 NOTE — Progress Notes (Signed)
Transferred to 5 west room 35 by wheelchair, stable, report given to RN, no belongings noted at bedside.

## 2015-10-24 NOTE — Progress Notes (Signed)
NURSING PROGRESS NOTE  Ardis HughsDavid B [place] [id number] Admission Data: 10/24/2015 6:31 PM Attending Provider: Lonia BloodJeffrey T McClung, MD PCP:No primary care provider on file. Code Status: FULL   Ardis HughsDavid B [place] is a 39 y.o. male patient admitted from ED:  -No acute distress noted.  -No complaints of shortness of breath.  -No complaints of chest pain.   Cardiac Monitoring: N/A  Blood pressure 163/95, pulse 75, temperature 99.3 F (37.4 C), temperature source Oral, resp. rate 16, height 6' (1.829 m), weight 102.8 kg (226 lb 10.1 oz), SpO2 96 %.   IV Fluids:  IV in place, occlusive dsg intact without redness, IV cath antecubital right, condition patent and no redness none.   Allergies:  Toradol and Reglan  Past Medical History:   has a past medical history of COPD (chronic obstructive pulmonary disease) (HCC); Bipolar disorder (HCC); and Anxiety.  Past Surgical History:   has no past surgical history on file.  Social History:   reports that he has been smoking Cigarettes.  He does not have any smokeless tobacco history on file. He reports that he drinks alcohol. He reports that he uses illicit drugs (Benzodiazepines, Cocaine, and Other-see comments).  Skin: Intact  Patient/Family orientated to room. Information packet given to patient/family. Admission inpatient armband information verified with patient/family to include name and date of birth and placed on patient arm. Side rails up x 2, fall assessment and education completed with patient/family. Patient/family able to verbalize understanding of risk associated with falls and verbalized understanding to call for assistance before getting out of bed. Call light within reach. Patient/family able to voice and demonstrate understanding of unit orientation instructions.    Will continue to evaluate and treat per MD orders.  Bennie Pieriniyndi Najah Liverman, RN

## 2015-10-24 NOTE — Progress Notes (Signed)
Verbalized that tramadol is not helping with his pain,threatened to leave if not given a stronger med.for pain.MD made aware, with order.

## 2015-10-24 NOTE — Consult Note (Signed)
[facility] Face-to-Face Psychiatry Consult   Reason for Consult:  Suicide attempt with intentional overdos and substance abuse Referring Physician:  Dr. Dolphus Jenny Patient Identification: Jonathan Perez MRN:  [id number] Principal Diagnosis: Suicide attempt [facility]) Diagnosis:   Patient Active Problem List   Diagnosis Date Noted  . Drug overdose [T50.901A] 10/22/2015  . Metabolic encephalopathy [G93.41] 10/22/2015  . QT prolongation [I45.81] 10/22/2015  . Suicide attempt (HCC) [T14.91] 10/22/2015  . Hypertensive urgency [I16.0] 10/22/2015  . Acute hypokalemia [E87.6] 10/22/2015  . Cocaine abuse [F14.10] 10/22/2015  . Acute respiratory failure with hypoxia (HCC) [J96.01] 10/22/2015    Total Time spent with patient: 1 hour  Subjective:   Jonathan Perez is a 39 y.o. male patient admitted with Suicide attempt with intentional overdos and substance abuse .  HPI: Jonathan Perez is a 39 years old male admitted to [facility] with the status post intentional overdose of his blood pressure medication, unknown amount. Patient reported he has been tired of living with his disability which is chronic back pain due to degenerative spinal disc disease and had surgery about 12 years ago. Patient reportedly taking disability check monthly and not able to say enough for him. Patient is also endorses polysubstance abuse versus dependence but unwilling to give details of illicit drugs that he has been abusing. Patient urine drug screen is positive for benzodiazepines and cocaine. Patient reportedly [facility] and receiving Adderall and Xanax from [facility] psychiatrist for ADD and anxiety over 5-6 years. (Patient urine drug screen does not reveal amphetamines and opiates). Patient also endorses recent acute psychiatric hospitalization at [facility] for similar clinical situation. He also reportedly diagnosed with bipolar disorder in the  past but does not give me his treatment recommendations. Patient endorses suicidal thoughts and continue to seek inpatient psychiatric hospitalization. Patient is willing to get collateral information from his wife, [facility] and also from old [address] [facility] and [facility] if needed.  The patient apparently was residing in a trailer home in [place]; there was a roommate present who was unable to tell EMS exactly what type of medications the patient had consumed and was unable to tell them the patient's name or provide any documentation of the patient's identity. He was quite hypertensive with blood pressure ranges between 161/121 and 177/120. EKG did reveal QT prolongation of 505 ms. LFTs were normal. Total CK was elevated at 131 but troponin was normal and lactic acid was normal at 1.99. Aspirin as well as Tylenol levels were within normal limits. Urinalysis was unremarkable. Alcohol level was less than 5.   Past Psychiatric History: Patient has a recent acute psychiatric hospitalization at [facility] for similar clinical situations like intentional overdose of medication and drugs. Patient reportedly receiving outpatient medication management from [facility] psychiatry, Dr. Brock Bad.  Risk to Self: Is patient at risk for suicide?: Yes Risk to Others:   Prior Inpatient Therapy:   Prior Outpatient Therapy:    Past Medical History:  Past Medical History  Diagnosis Date  . COPD (chronic obstructive pulmonary disease) (HCC)   . Bipolar disorder (HCC)   . Anxiety    History reviewed. No pertinent past surgical history. Family History: History reviewed. No pertinent family history. Family Psychiatric  History: Unknown Social History:  History  Alcohol Use  . Yes     History  Drug Use  . Yes  . Special: Benzodiazepines, Cocaine,  Other-see comments    Social History   Social History  . Marital Status: Single     Spouse Name: N/A  . Number of Children: N/A  . Years of Education: N/A   Social History Main Topics  . Smoking status: Current Every Day Smoker    Types: Cigarettes  . Smokeless tobacco: None  . Alcohol Use: Yes  . Drug Use: Yes    Special: Benzodiazepines, Cocaine, Other-see comments  . Sexual Activity: Yes   Other Topics Concern  . None   Social History Narrative  . None   Additional Social History: Patient reportedly living with his wife and a roommate.      Allergies:   Allergies  Allergen Reactions  . Toradol [Ketorolac Tromethamine] Itching    Patient stated he would not take this medication ever again   . Reglan [Metoclopramide] Other (See Comments)    Patient stated he can't take this due to burning in his GI tract.      Current Facility-Administered Medications  Medication Dose Route Frequency Provider Last Rate Last Dose  . 0.9 % NaCl with KCl 20 mEq/ L  infusion   Intravenous Continuous Lonia BloodJeffrey T McClung, MD 50 mL/hr at 10/24/15 0000    . acetaminophen (TYLENOL) tablet 500 mg  500 mg Oral Q6H PRN Lonia BloodJeffrey T McClung, MD      . amphetamine-dextroamphetamine (ADDERALL) tablet 20 mg  20 mg Oral BID Lonia BloodJeffrey T McClung, MD   20 mg at 10/24/15 0912  . antiseptic oral rinse (CPC / CETYLPYRIDINIUM CHLORIDE 0.05%) solution 7 mL  7 mL Mouth Rinse BID Russella DarAllison L Ellis, NP   7 mL at 10/24/15 1000  . Chlorhexidine Gluconate Cloth 2 % PADS 6 each  6 each Topical Q0600 Ozella Rocksavid J Merrell, MD   6 each at 10/24/15 1000  . folic acid (FOLVITE) tablet 1 mg  1 mg Oral Daily Russella DarAllison L Ellis, NP   1 mg at 10/24/15 0913  . insulin aspart (novoLOG) injection 0-9 Units  0-9 Units Subcutaneous 6 times per day Russella DarAllison L Ellis, NP   0 Units at 10/22/15 1730  . lidocaine (LIDODERM) 5 % 1 patch  1 patch Transdermal Q24H Lonia BloodJeffrey T McClung, MD   1 patch at 10/23/15 1545  . LORazepam (ATIVAN) tablet 1 mg  1 mg Oral Q6H PRN Russella DarAllison L Ellis, NP   1 mg at 10/23/15 2307   Or  . LORazepam (ATIVAN)  injection 1 mg  1 mg Intravenous Q6H PRN Russella DarAllison L Ellis, NP      . multivitamin with minerals tablet 1 tablet  1 tablet Oral Daily Russella DarAllison L Ellis, NP   1 tablet at 10/24/15 0912  . mupirocin ointment (BACTROBAN) 2 % 1 application  1 application Nasal BID Ozella Rocksavid J Merrell, MD   1 application at 10/23/15 2308  . thiamine (VITAMIN B-1) tablet 100 mg  100 mg Oral Daily Russella DarAllison L Ellis, NP   100 mg at 10/24/15 0912  . traMADol (ULTRAM) tablet 50 mg  50 mg Oral Q6H PRN Lonia BloodJeffrey T McClung, MD   50 mg at 10/24/15 0803    Musculoskeletal: Strength & Muscle Tone: decreased Gait & Station: unable to stand Patient leans: N/A  Psychiatric Specialty Exam: ROS complaining about low back pain, anxiety but denied nausea or vomiting, sweating, shortness of breath and chest pain No Fever-chills, No Headache, No changes with Vision or hearing, reports vertigo No problems swallowing food or Liquids, No Chest pain, Cough or Shortness of Breath,  No Abdominal pain, No Nausea or Vommitting, Bowel movements are regular, No Blood in stool or Urine, No dysuria, No new skin rashes or bruises, No new joints pains-aches,  No new weakness, tingling, numbness in any extremity, No recent weight gain or loss, No polyuria, polydypsia or polyphagia,   A full 10 point Review of Systems was done, except as stated above, all other Review of Systems were negative.  Blood pressure 101/87, pulse 56, temperature 98.1 F (36.7 C), temperature source Oral, resp. rate 14, height 6' (1.829 m), weight 102.8 kg (226 lb 10.1 oz), SpO2 94 %.Body mass index is 30.73 kg/(m^2).  General Appearance: Disheveled and Guarded  Eye Contact::  Good  Speech:  Clear and Coherent  Volume:  Normal  Mood:  Depressed  Affect:  Constricted and Depressed  Thought Process:  Coherent and Goal Directed  Orientation:  Full (Time, Place, and Person)  Thought Content:  WDL  Suicidal Thoughts:  Yes.  without intent/plan  Homicidal Thoughts:  No   Memory:  Immediate;   Fair Recent;   Fair  Judgement:  Impaired  Insight:  Lacking  Psychomotor Activity:  Decreased  Concentration:  Fair  Recall:  [employer] of Knowledge:Good  Language: Good  Akathisia:  Negative  Handed:  Right  AIMS (if indicated):     Assets:  Communication Skills Desire for Improvement Financial Resources/Insurance Housing Leisure Time Resilience Social Support Talents/Skills  ADL's:  Impaired  Cognition: WNL  Sleep:      Treatment Plan Summary: Daily contact with patient to assess and evaluate symptoms and progress in treatment and Medication management  Safety concerns: Patent attorney  Benefit from Ativan detox protocol and CIWA monitoring  Monitor for drug seeking behavior and at the same time benzodiazepine/opioid withdrawal. Hold Adderall and Xanax due to illicit drug abuse versus dependence (cocaine) Appreciate psychiatric consultation and follow up as clinically required Please contact [phone] or [phone] if needs further assistance  Disposition: Recommend psychiatric Inpatient admission when medically cleared. Supportive therapy provided about ongoing stressors.  Ariany Kesselman,JANARDHAHA R. 10/24/2015 11:33 AM

## 2015-10-25 ENCOUNTER — Inpatient Hospital Stay
Admission: RE | Admit: 2015-10-25 | Discharge: 2015-10-27 | DRG: 885 | Disposition: A | Discharge status: Home / Self Care | Payer: 59 | Source: Other Acute Inpatient Hospital | Attending: Psychiatry | Admitting: Psychiatry

## 2015-10-25 ENCOUNTER — Encounter: Payer: Self-pay | Admitting: Psychiatry

## 2015-10-25 DIAGNOSIS — Z888 Allergy status to other drugs, medicaments and biological substances status: Secondary | ICD-10-CM | POA: Diagnosis not present

## 2015-10-25 DIAGNOSIS — F1110 Opioid abuse, uncomplicated: Secondary | ICD-10-CM | POA: Diagnosis present

## 2015-10-25 DIAGNOSIS — Z9889 Other specified postprocedural states: Secondary | ICD-10-CM

## 2015-10-25 DIAGNOSIS — Z915 Personal history of self-harm: Secondary | ICD-10-CM

## 2015-10-25 DIAGNOSIS — G4700 Insomnia, unspecified: Secondary | ICD-10-CM | POA: Diagnosis present

## 2015-10-25 DIAGNOSIS — G894 Chronic pain syndrome: Secondary | ICD-10-CM | POA: Diagnosis present

## 2015-10-25 DIAGNOSIS — J9601 Acute respiratory failure with hypoxia: Secondary | ICD-10-CM

## 2015-10-25 DIAGNOSIS — T50902D Poisoning by unspecified drugs, medicaments and biological substances, intentional self-harm, subsequent encounter: Secondary | ICD-10-CM

## 2015-10-25 DIAGNOSIS — F909 Attention-deficit hyperactivity disorder, unspecified type: Secondary | ICD-10-CM | POA: Diagnosis present

## 2015-10-25 DIAGNOSIS — F419 Anxiety disorder, unspecified: Secondary | ICD-10-CM | POA: Diagnosis present

## 2015-10-25 DIAGNOSIS — F1420 Cocaine dependence, uncomplicated: Secondary | ICD-10-CM | POA: Diagnosis present

## 2015-10-25 DIAGNOSIS — E876 Hypokalemia: Secondary | ICD-10-CM

## 2015-10-25 DIAGNOSIS — R45851 Suicidal ideations: Secondary | ICD-10-CM | POA: Diagnosis present

## 2015-10-25 DIAGNOSIS — I10 Essential (primary) hypertension: Secondary | ICD-10-CM | POA: Diagnosis present

## 2015-10-25 DIAGNOSIS — Z981 Arthrodesis status: Secondary | ICD-10-CM

## 2015-10-25 DIAGNOSIS — F17200 Nicotine dependence, unspecified, uncomplicated: Secondary | ICD-10-CM | POA: Diagnosis present

## 2015-10-25 DIAGNOSIS — F332 Major depressive disorder, recurrent severe without psychotic features: Secondary | ICD-10-CM | POA: Diagnosis present

## 2015-10-25 HISTORY — DX: Essential (primary) hypertension: I10

## 2015-10-25 HISTORY — DX: Sleep apnea, unspecified: G47.30

## 2015-10-25 LAB — CBC
HCT: 50.7 % (ref 39.0–52.0)
HEMOGLOBIN: 17.2 g/dL — AB (ref 13.0–17.0)
MCH: 33.1 pg (ref 26.0–34.0)
MCHC: 33.9 g/dL (ref 30.0–36.0)
MCV: 97.5 fL (ref 78.0–100.0)
Platelets: 217 10*3/uL (ref 150–400)
RBC: 5.2 MIL/uL (ref 4.22–5.81)
RDW: 14.1 % (ref 11.5–15.5)
WBC: 8.2 10*3/uL (ref 4.0–10.5)

## 2015-10-25 LAB — BASIC METABOLIC PANEL
Anion gap: 9 (ref 5–15)
BUN: 14 mg/dL (ref 6–20)
CALCIUM: 8.9 mg/dL (ref 8.9–10.3)
CHLORIDE: 104 mmol/L (ref 101–111)
CO2: 24 mmol/L (ref 22–32)
CREATININE: 1.01 mg/dL (ref 0.61–1.24)
GFR calc Af Amer: >60 mL/min (ref >60)
GFR calc non Af Amer: >60 mL/min (ref >60)
Glucose, Bld: 89 mg/dL (ref 65–99)
Potassium: 3.6 mmol/L (ref 3.5–5.1)
SODIUM: 137 mmol/L (ref 135–145)

## 2015-10-25 LAB — GLUCOSE, CAPILLARY: Glucose-Capillary: 91 mg/dL (ref 65–99)

## 2015-10-25 MED ORDER — TRAMADOL HCL 50 MG PO TABS
50.0000 mg | ORAL_TABLET | Freq: Once | ORAL | Status: AC
  Administered 2015-10-25: 50 mg via ORAL
  Filled 2015-10-25: qty 1

## 2015-10-25 MED ORDER — ALUM & MAG HYDROXIDE-SIMETH 200-200-20 MG/5ML PO SUSP: 30.0000 mL | ORAL | Status: DC | PRN

## 2015-10-25 MED ORDER — NICOTINE 21 MG/24HR TD PT24
21.0000 mg | MEDICATED_PATCH | Freq: Every day | TRANSDERMAL | Status: DC
  Administered 2015-10-26: 21 mg via TRANSDERMAL
  Filled 2015-10-25: qty 1

## 2015-10-25 MED ORDER — TRAZODONE HCL 100 MG PO TABS
200.0000 mg | ORAL_TABLET | Freq: Every evening | ORAL | Status: DC | PRN
  Administered 2015-10-25: 200 mg via ORAL
  Filled 2015-10-25: qty 2

## 2015-10-25 MED ORDER — ACETAMINOPHEN 325 MG PO TABS: 650.0000 mg | ORAL_TABLET | Freq: Four times a day (QID) | ORAL | Status: DC | PRN

## 2015-10-25 MED ORDER — CHLORDIAZEPOXIDE HCL 25 MG PO CAPS: 50.0000 mg | ORAL_CAPSULE | Freq: Three times a day (TID) | ORAL | Status: DC

## 2015-10-25 MED ORDER — CLONIDINE HCL 0.1 MG PO TABS
0.1000 mg | ORAL_TABLET | Freq: Once | ORAL | Status: AC
Start: 2015-10-25 — End: 2015-10-25
  Administered 2015-10-25: 0.1 mg via ORAL
  Filled 2015-10-25: qty 1

## 2015-10-25 MED ORDER — MAGNESIUM HYDROXIDE 400 MG/5ML PO SUSP: 30.0000 mL | Freq: Every day | ORAL | Status: DC | PRN

## 2015-10-25 MED ORDER — CHLORDIAZEPOXIDE HCL 25 MG PO CAPS
50.0000 mg | ORAL_CAPSULE | Freq: Four times a day (QID) | ORAL | Status: DC
  Administered 2015-10-25 – 2015-10-27 (×7): 50 mg via ORAL
  Filled 2015-10-25 (×7): qty 2

## 2015-10-25 NOTE — Care Management Important Message (Signed)
Important Message  Patient Details  Name: Jonathan Perez MRN: [id number] Date of Birth: 04/06/1976   Medicare Important Message Given:  Yes    Leone Havenaylor, Homer Pfeifer Clinton, RN 10/25/2015, 11:40 AMImportant Message  Patient Details  Name: Jonathan Perez MRN: [id number] Date of Birth: 06/05/1976   Medicare Important Message Given:  Yes    Leone Havenaylor, Zaylynn Rickett Clinton, RN 10/25/2015, 11:40 AM

## 2015-10-25 NOTE — Care Management Note (Signed)
Case Management Note  Patient Details  Name: Jonathan Perez MRN: [id number] Date of Birth: 04/18/1976  Subjective/Objective:     Patient with sitter, patient is medically ready to dc to inpatient psych when bed available.  Psych CSW following.               Action/Plan:   Expected Discharge Date:  10/25/15               Expected Discharge Plan:  Psychiatric Hospital  In-House Referral:  Clinical Social Work  Discharge planning Services  CM Consult  Post Acute Care Choice:    Choice offered to:     DME Arranged:    DME Agency:     HH Arranged:    HH Agency:     Status of Service:  Completed, signed off  Medicare Important Message Given:  Yes Date Medicare IM Given:    Medicare IM give by:    Date Additional Medicare IM Given:    Additional Medicare Important Message give by:     If discussed at Long Length of Stay Meetings, dates discussed:    Additional Comments:  Leone Havenaylor, Hurbert Duran Clinton, RN 10/25/2015, 11:42 AM

## 2015-10-25 NOTE — Clinical Social Work Psych Note (Signed)
Psych CSW received notification that the patient has been accepted to [facility].    RN to call report prior to transportation to: [phone] [place] to transport.  Scheduled for 3:30pm (per [place] request) [phone] Patient is voluntary.    Accepting physician: MD Pucilowska  Bed Assignment: [address]  Gina Kissy Cielo, LCSW [phone]  [facility] Licensed Clinical Social Worker
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
## 2015-10-25 NOTE — H&P (Addendum)
Pt requesting xanax and oxycodone.  Per review of records he was abusing benzos and opiods.  Pt was apparently taking high dose of alprazolan.  To prevent withdrawal I will order VS TID, CIWA tid and librium 50 mg qid.

## 2015-10-25 NOTE — Consult Note (Signed)
[facility]   Reason for Consult:  Suicide attempt with intentional overdos and substance abuse Referring Physician:  Dr. Dolphus JennyMclung Patient Identification: Jonathan Perez Salinger MRN:  [id number] Principal Diagnosis: Suicide attempt [facility](HCC) Diagnosis:   Patient Active Problem List   Diagnosis Date Noted  . Drug overdose [T50.901A] 10/22/2015  . Metabolic encephalopathy [G93.41] 10/22/2015  . QT prolongation [I45.81] 10/22/2015  . Suicide attempt (HCC) [T14.91] 10/22/2015  . Hypertensive urgency [I16.0] 10/22/2015  . Acute hypokalemia [E87.6] 10/22/2015  . Cocaine abuse [F14.10] 10/22/2015  . Acute respiratory failure with hypoxia (HCC) [J96.01] 10/22/2015    Total Time spent with patient: 30 minutes  Subjective:   Jonathan Perez Korzeniewski is a 39 y.o. male patient admitted with Suicide attempt with intentional overdos and substance abuse .  HPI: Jonathan Perez is a 39 years old male [facility] with the status post intentional overdose of his blood pressure medication, unknown amount. Patient reported he has been tired of living with his disability which is chronic back pain due to degenerative spinal disc disease and had surgery about 12 years ago. Patient reportedly taking disability check monthly and not able to say enough for him. Patient is also endorses polysubstance abuse versus dependence but unwilling to give details of illicit drugs that he has been abusing. Patient urine drug screen is positive for benzodiazepines and cocaine. Patient reportedly receiving [facility] and receiving [facility] and [facility] for ADD and anxiety over 5-6 years. (Patient urine drug screen does not reveal amphetamines and opiates). Patient also endorses recent acute psychiatric hospitalization at [facility] for similar clinical situation. He also reportedly diagnosed with bipolar disorder in  the past but does not give me his treatment recommendations. Patient endorses suicidal thoughts and continue to seek inpatient psychiatric hospitalization. Patient is willing to get collateral information from his wife, [facility] and [facility] if needed. Past Psychiatric History: Patient has a recent acute psychiatric hospitalization at [facility] for similar clinical situations like intentional overdose of medication and drugs. Patient reportedly receiving [facility], Dr. Brock BadKatherine McDonald.  Interval history: Patient seen for psychiatric consultation follow-up today and case discussed. Psychiatric social service. Reportedly patient has been accepted to [facility] and made appropriate arrangements for transportation. Reportedly patient agrees to sign in voluntarily. Patient reported he had intentional overdose of his blood pressure medication because of feeling depressed, sad and does not want to be to his family and friends. Patient stated that he does not have intention of death feeling. Patient requested Xanax need to be reintroduced because Ativan is not helping for his anxiety. Patient has become emotional, dysphoric and tearful during my interaction with him. Patient stated he become disabled because of chronic back injury and sleep apnea. Patient also reported he was diagnosed with bipolar disorder several years ago but did not follow through treatment with mood stabilization.  Risk to Self: Is patient at risk for suicide?: Yes Risk to Others:   Prior Inpatient Therapy:   Prior Outpatient Therapy:    Past Medical History:  Past Medical History  Diagnosis Date  . COPD (chronic obstructive pulmonary disease) (HCC)   . Bipolar disorder (HCC)   . Anxiety    History reviewed. No pertinent past surgical history. Family History: History  reviewed. No pertinent family history. Family Psychiatric  History:  Unknown Social History:  History  Alcohol Use  . Yes     History  Drug Use  . Yes  . Special: Benzodiazepines, Cocaine, Other-see comments    Social History   Social History  . Marital Status: Single    Spouse Name: N/A  . Number of Children: N/A  . Years of Education: N/A   Social History Main Topics  . Smoking status: Current Every Day Smoker    Types: Cigarettes  . Smokeless tobacco: None  . Alcohol Use: Yes  . Drug Use: Yes    Special: Benzodiazepines, Cocaine, Other-see comments  . Sexual Activity: Yes   Other Topics Concern  . None   Social History Narrative  . None   Additional Social History: Patient reportedly living with his wife and a roommate.      Allergies:   Allergies  Allergen Reactions  . Toradol [Ketorolac Tromethamine] Itching    Patient stated he would not take this medication ever again   . Reglan [Metoclopramide] Other (See Comments)    Patient stated he can't take this due to burning in his GI tract.      Current Facility-Administered Medications  Medication Dose Route Frequency Provider Last Rate Last Dose  . acetaminophen (TYLENOL) tablet 500 mg  500 mg Oral Q6H PRN Lonia Blood, MD      . amphetamine-dextroamphetamine (ADDERALL) tablet 20 mg  20 mg Oral BID Lonia Blood, MD   20 mg at 10/25/15 1207  . antiseptic oral rinse (CPC / CETYLPYRIDINIUM CHLORIDE 0.05%) solution 7 mL  7 mL Mouth Rinse BID Russella Dar, NP   7 mL at 10/25/15 1000  . Chlorhexidine Gluconate Cloth 2 % PADS 6 each  6 each Topical Q0600 Ozella Rocks, MD   6 each at 10/25/15 [phone]  . folic acid (FOLVITE) tablet 1 mg  1 mg Oral Daily Russella Dar, NP   1 mg at 10/25/15 1207  . lidocaine (LIDODERM) 5 % 1 patch  1 patch Transdermal Q24H Lonia Blood, MD   1 patch at 10/24/15 1409  . LORazepam (ATIVAN) tablet 1 mg  1 mg Oral Q6H PRN Russella Dar, NP   1 mg at 10/25/15 1220    Or  . LORazepam (ATIVAN) injection 1 mg  1 mg Intravenous Q6H PRN Russella Dar, NP      . multivitamin with minerals tablet 1 tablet  1 tablet Oral Daily Russella Dar, NP   1 tablet at 10/25/15 1207  . mupirocin ointment (BACTROBAN) 2 % 1 application  1 application Nasal BID Ozella Rocks, MD   1 application at 10/25/15 1207  . oxyCODONE (Oxy IR/ROXICODONE) immediate release tablet 5-10 mg  5-10 mg Oral Q3H PRN Lonia Blood, MD   10 mg at 10/25/15 1220  . thiamine (VITAMIN Perez-1) tablet 100 mg  100 mg Oral Daily Russella Dar, NP   100 mg at 10/25/15 1207  . traMADol (ULTRAM) tablet 50 mg  50 mg Oral Q6H PRN Lonia Blood, MD   50 mg at 10/24/15 1854    Musculoskeletal: Strength & Muscle Tone: decreased Gait & Station: unable to stand Patient leans: N/A  Psychiatric Specialty Exam: ROS   Blood pressure 128/90, pulse 54, temperature 98 F (36.7 C), temperature source Oral, resp. rate 18, height  (1.727 m), weight 98.8 kg (217 lb 13 oz), SpO2 99 %.Body mass index is 33.13 kg/(m^2).  General Appearance: Disheveled and Guarded  Eye Contact::  Good  Speech:  Clear and Coherent  Volume:  Normal  Mood:  Depressed  Affect:  Constricted and Depressed  Thought Process:  Coherent and Goal Directed  Orientation:  Full (Time, Place, and Person)  Thought Content:  WDL  Suicidal Thoughts:  Yes.  without intent/plan  Homicidal Thoughts:  No  Memory:  Immediate;   Fair Recent;   Fair  Judgement:  Impaired  Insight:  Lacking  Psychomotor Activity:  Decreased  Concentration:  Fair  Recall:  [employer] of Knowledge:Good  Language: Good  Akathisia:  Negative  Handed:  Right  AIMS (if indicated):     Assets:  Communication Skills Desire for Improvement Financial Resources/Insurance Housing Leisure Time Resilience Social Support Talents/Skills  ADL's:  Impaired  Cognition: WNL  Sleep:      Treatment Plan Summary: Daily contact with patient to assess and evaluate  symptoms and progress in treatment and Medication management   Safety concerns: Patent attorney  Benefit from Ativan detox protocol and CIWA monitoring if needed Monitor for drug seeking behavior / benzodiazepine/opioid withdrawal. Hold Adderall and Xanax due to illicit drug abuse versus dependence (cocaine) Appreciate psychiatric consultation and will sign off as he was accepted to [facility] Please contact [phone] or [phone] if needs further assistance  Disposition: Case discussed with the psychiatric social service regarding psychiatric placement needs. Recommend psychiatric Inpatient admission when medically cleared. Supportive therapy provided about ongoing stressors.  Lavona Norsworthy,JANARDHAHA R. 10/25/2015 2:50 PM

## 2015-10-25 NOTE — Progress Notes (Signed)
[facility]. All questions answered.

## 2015-10-25 NOTE — Progress Notes (Signed)
CSW order received re: Behavioral Health issues impacting hospitalization.  Patient is being followed by psych services;  Discussed with Vickii PennaGina Ingle, LCSW- Psych Service CSW who will follow patient. This CSW will sign off.  Lorri Frederickonna T. Jaci LazierCrowder, KentuckyLCSW [phone]

## 2015-10-25 NOTE — Progress Notes (Signed)
  Pending review for [facility].   10/25/2015 Cheryl FlashNicole Pihu Basil, MS, NCC, LPCA Therapeutic Triage Specialist

## 2015-10-25 NOTE — Progress Notes (Signed)
PROGRESS NOTE  Jonathan Perez ZOX:[id number] DOB: May 01, 1976 DOA: 10/22/2015 PCP: No primary care provider on file.  Brief Narrative 39 yo male who at time of presentation was of unknown age or name, who came by EMS with reported intentional drug overdose.  He has a past medical history as below, which includes chronic back pain, kidney stones, ADD, methadone use. He has a history of hospital admissions for substance abuse and suicide attempts. He had an admission on 10/03/15 when he admitted to taking 20 carvedilol pills,  5 Xanax and 3 Adderall.  On 10/22/15, Per EMS the patient called 911 claiming that he was going to kill himself by overdosing on 100 over-the-counter sleeping pills. Upon their arrival to the patient's residence the patient was alternatively combative and apneic. At times they had to perform BVM ventilatory support. The patient apparently was residing in a trailer [place] in [facility]; there was a roommate present who was unable to tell EMS exactly what type of medications the patient had consumed and was unable to tell them the patient's name or provide any documentation of the patient's identity. Patient was given 2 mg IV Narcan by EMS with no change in condition. Because it is unknown whether the patient takes chronic benzodiazepines Romazicon was not given. No one accompanied the patient to the ER.  After arrival to the ER the patient appeared to wake up more although remained nonverbal. He did have brief periods of hypoxemia prompting application initially of nasal cannula mask but then transitioning to a partial rebreathing/simple mask because of mouth breathing. An ABG was unremarkable--7.47/36/116/26. LFTs were normal. Total CK was elevated at 131 but troponin was normal and lactic acid was normal. Aspirin as well as Tylenol levels were within normal limits. Urinalysis was unremarkable. Alcohol level was less than 5. Urine drug screen positive for benzodiazepines  and cocaine. Patient reportedly receiving outpatient medication management from [facility] psychiatry, Dr. Brock Bad   Assessment/Plan: Acute respiratory failure with hypoxia -Resolved -Presently stable on room air  Drug overdose/ Suicide attempt  -ASA and APAP levels undetectable - UDS + for benzos and cocaine - pt admitted to [facility] for same in October 2016 - pt claims he OD on BB again, but clinically this does not seem likely as neither bradycardia nor hypotension have been an issue  -patient appears stable at this time with no ongoing deleterious effects of his toxic ingestion of an unknown type  -psychiatry has recommended inpatient treatment and the patient agrees - nonetheless, should he change his mind and refused he will need to be involuntarily committed as he has clearly presented a danger to himself - accordingly he should not be allowed to leave AMA -[facility] and receiving Adderall and Xanax from [facility] regional psychiatrist for ADD and anxiety over 5-6 years  Hypertensive urgency BP now well controlled   Chronic Back Pain -spoke with [facility]--pt was receiving methadone  daily -patient is NOT honest and he exhibits manipulative behavior -when asked, pt stated he took oxycodone and methadone  -10/25/15--called [facility] who stated pt was only receiving methadone 20 mg daily--did not recommended restarting any opioids after d/c and did not recommend restarting methadone until pt follows up with them  Toxic metabolic encephalopathy CT head w/o acute findings - due to toxic ingestion of unknown substance - resolved   Prolonged QT Resolved   Bipolar d/o Psych  suggest inpatient psychiatric treatment -hold xanax per psychiatry recommendation  Hypokalemia  K+ has normalized  Cocaine abuse  Patient counseled on the absolute need to discontinue cocaine  abuse  Chronic back pain  -With mental status improving and evidence of legitimate back pain oral oxycodone was initiate -follow for symptom control - we will monitor closely for manipulative or drug seeking behavior   MRSA screen +    Total time  Family Communication:   None at beside Disposition Plan:   Medically stable to dischage toBehavioral Health when bed available       Procedures/Studies: Ct Head Wo Contrast  10/22/2015  CLINICAL DATA:  Recent sleeping pill overdose with apnea EXAM: CT HEAD WITHOUT CONTRAST TECHNIQUE: Contiguous axial images were obtained from the base of the skull through the vertex without intravenous contrast. COMPARISON:  None. FINDINGS: Bony calvarium is intact. An airway is noted in place. No findings to suggest acute hemorrhage, acute infarction or space-occupying mass lesion are noted this time IMPRESSION: No acute intracranial abnormality noted. Electronically Signed   By: Alcide CleverMark  Lukens M.D.   On: 10/22/2015 18:06   Dg Chest Port 1 View  10/22/2015  CLINICAL DATA:  Overdose. EXAM: PORTABLE CHEST 1 VIEW COMPARISON:  None. FINDINGS: Lungs are adequately inflated without focal consolidation or effusion. Cardiomediastinal silhouette is within normal. Spinal stabilization hardware is intact. IMPRESSION: No active disease. Electronically Signed   By: Elberta Fortisaniel  Boyle M.D.   On: 10/22/2015 16:27        Subjective: Patient complains of low back pain. Denies any fevers, chills, chest pain, shortness breath, nausea, vomiting, diarrhea, abdominal pain. Denies any headache or visual disturbance.  Objective: Filed Vitals:   10/24/15 1617 10/24/15 1829 10/25/15 0101 10/25/15 0611  BP: 118/79 163/95 140/95 128/90  Pulse: 73 75 66 54  Temp: 98.6 F (37 C) 99.3 F (37.4 C) 98.2 F (36.8 C) 98 F (36.7 C)  TempSrc: Oral Oral Oral Oral  Resp: 21 16 18 18   Height:  5\' 8"  (1.727 m)    Weight:  98.8 kg (217 lb 13 oz)    SpO2: 95% 96% 93% 99%     Intake/Output Summary (Last 24 hours) at 10/25/15 1055 Last data filed at 10/25/15 0926  Gross per 24 hour  Intake    650 ml  Output    450 ml  Net    200 ml   Weight change:  Exam:   General:  Pt is alert, follows commands appropriately, not in acute distress  HEENT: No icterus, No thrush, No neck mass, [place]/AT  Cardiovascular: RRR, S1/S2, no rubs, no gallops  Respiratory: CTA bilaterally, no wheezing, no crackles, no rhonchi  Abdomen: Soft/+BS, non tender, non distended, no guarding; no hepatosplenomegaly  Extremities: No edema, No lymphangitis, No petechiae, No rashes, no synovitis; no cyanosis or clubbing  Data Reviewed: Basic Metabolic Panel:  Recent Labs Lab 10/22/15 1416 10/22/15 1428 10/23/15 0245 10/24/15 0437  NA 140 145  145 137 138  K 3.6 3.5  3.5 3.7 3.7  CL 107 105  105 106 105  CO2 25  --  24 25  GLUCOSE 77 74  74 110* 95  BUN 10 13  13 7 7   CREATININE 1.04 1.00  1.00 0.86 0.98  CALCIUM 9.2  --  8.5* 8.9  MG 1.9  --   --  1.5*  PHOS  --   --   --  2.2*   Liver Function Tests:  Recent Labs Lab 10/22/15 1416  10/23/15 0245 10/24/15 0437  AST 33 23 18  ALT ALKPHOS 82 68 60  BILITOT 1.0 1.0 1.2  PROT 7.2 6.1* 6.3*  ALBUMIN 4.2 3.8 3.7   No results for input(s): LIPASE, AMYLASE in the last 168 hours. No results for input(s): AMMONIA in the last 168 hours. CBC:  Recent Labs Lab 10/22/15 1416 10/22/15 1428 10/23/15 0245 10/24/15 0437  WBC 10.3  --  14.2* 13.5*  NEUTROABS 7.0  --   --   --   HGB 17.6* 17.3*  17.3* 15.3 15.4  HCT 50.7 51.0  51.0 45.6 45.8  MCV 98.3  --  98.1 98.3  PLT 203  --  231 227   Cardiac Enzymes:  Recent Labs Lab 10/22/15 1416 10/23/15 0245  CKTOTAL 131 69  TROPONINI <0.03  --    BNP: Invalid input(s): POCBNP CBG:  Recent Labs Lab 10/23/15 1943 10/23/15 2329 10/24/15 0407 10/24/15 0726 10/24/15 1615  GLUCAP 77 95 93 94 91    Recent Results (from the past 240 hour(s))   MRSA PCR Screening     Status: Abnormal   Collection Time: 10/22/15  9:19 PM  Result Value Ref Range Status   MRSA by PCR POSITIVE (A) NEGATIVE Final    Comment:        The GeneXpert MRSA Assay (FDA approved for NASAL specimens only), is one component of a comprehensive MRSA colonization surveillance program. It is not intended to diagnose MRSA infection nor to guide or monitor treatment for MRSA infections. RESULT CALLED TO, READ BACK BY AND VERIFIED WITH: RN ZARSONA R. [id number]  THANEY      Scheduled Meds: . amphetamine-dextroamphetamine  20 mg Oral BID  . antiseptic oral rinse  7 mL Mouth Rinse BID  . Chlorhexidine Gluconate Cloth  6 each Topical Q0600  . folic acid  1 mg Oral Daily  . lidocaine  1 patch Transdermal Q24H  . multivitamin with minerals  1 tablet Oral Daily  . mupirocin ointment  1 application Nasal BID  . thiamine  100 mg Oral Daily   Continuous Infusions:    Jonathan Perez, [place], DO  [facility] Pager [phone]  If 7PM-7AM, please contact night-coverage [email or web address] Password [email or web address] 10/25/2015, 10:55 AM   LOS: 3 days

## 2015-10-25 NOTE — Plan of Care (Signed)
Problem: Ineffective individual coping Goal: STG: Patient will remain free from self harm Outcome: Not Progressing Agitated and angry because of medication changes, no PRN given, room closer to the nurses' station, 15 minute checks maintained for safety, clinical and moral support provided, patient encouraged to continue to express feelings and demonstrate safe care. Patient remain free from harm, will continue to monitor.

## 2015-10-25 NOTE — Tx Team (Signed)
Initial Interdisciplinary Treatment Plan   PATIENT STRESSORS: Financial difficulties Substance abuse   PATIENT STRENGTHS: Manufacturing systems engineerCommunication skills Supportive family/friends   PROBLEM LIST: Problem List/Patient Goals Date to be addressed Date deferred Reason deferred Estimated date of resolution  Depression      Suicide attempt      Substance abuse                                           DISCHARGE CRITERIA:  Ability to meet basic life and health needs Improved stabilization in mood, thinking, and/or behavior  PRELIMINARY DISCHARGE PLAN: Attend 12-step recovery group Return to previous living arrangement  PATIENT/FAMIILY INVOLVEMENT: This treatment plan has been presented to and reviewed with the patient, Jonathan HughsDavid B Perez.  The patient and family have been given the opportunity to ask questions and make suggestions.  Bryson DamesStringfellow, Parminder Cupples Ann 10/25/2015, 8:02 PM

## 2015-10-25 NOTE — Discharge Summary (Signed)
Physician Discharge Summary  GAROLD SHEELER XBM:[id number] DOB: [birth year] DOA: 10/22/2015  PCP: No primary care provider on file.  Admit date: 10/22/2015 Discharge date: 10/25/2015  Recommendations for Outpatient Follow-up:  1. Pt will need to follow up with PCP in 2 weeks post discharge   Discharge Diagnoses:  Acute respiratory failure with hypoxia -Resolved -Presently stable on room air  Drug overdose/ Suicide attempt  -ASA and APAP levels undetectable - UDS + for benzos and cocaine - pt admitted to [facility] for same in October 2016 - pt claims he OD on BB again, but clinically this does not seem likely as neither bradycardia nor hypotension have been an issue -patient appears stable at this time with no ongoing deleterious effects of his toxic ingestion of an unknown type  -psychiatry has recommended inpatient treatment and the patient agrees - nonetheless, should he change his mind and refused he will need to be involuntarily committed as he has clearly presented a danger to himself - accordingly he should not be allowed to leave AMA -reported[facility] and receiving Adderall and Xanax from [facility] regional psychiatrist for ADD and anxiety over 5-6 years  Hypertensive urgency BP now well controlled   Chronic Back Pain -spoke with [facility]--pt was receiving methadone  daily -patient is NOT honest and he exhibits manipulative behavior -when asked, pt stated he took oxycodone and methadone  -10/25/15--called [facility] who stated pt was only receiving methadone 20 mg daily--did not recommended restarting any opioids after d/c and did not recommend restarting methadone until pt follows up with them  Toxic metabolic encephalopathy CT head w/o acute findings - due to toxic ingestion of unknown substance - resolved   Prolonged QT Resolved   Bipolar d/o Psych suggest inpatient psychiatric  treatment -hold xanax per psychiatry recommendation  Hypokalemia  K+ has normalized  Cocaine abuse  Patient counseled on the absolute need to discontinue cocaine abuse  Chronic back pain  -With mental status improving and evidence of legitimate back pain oral oxycodone was initiate -follow for symptom control - we will monitor closely for manipulative or drug seeking behavior   MRSA screen +  Discharge Condition: stable  Disposition: Behavioral health  Diet:regular Wt Readings from Last 3 Encounters:  10/24/15 98.8 kg (217 lb 13 oz)    History of present illness:  39 yo male who at time of presentation was of unknown age or name, who came by EMS with reported intentional drug overdose. He has a past medical history as below, which includes chronic back pain, kidney stones, ADD, methadone use. He has a history of hospital admissions for substance abuse and suicide attempts. He had an admission on 10/03/15 when he admitted to taking 20 carvedilol pills, 5 Xanax and 3 Adderall. On 10/22/15, Per EMS the patient called 911 claiming that he was going to kill himself by overdosing on 100 over-the-counter sleeping pills. Upon their arrival to the patient's residence the patient was alternatively combative and apneic. At times they had to perform BVM ventilatory support. The patient apparently was residing in a trailer home in [facility]; there was a roommate present who was unable to tell EMS exactly what type of medications the patient had consumed and was unable to tell them the patient's name or provide any documentation of the patient's identity. Patient was given 2 mg IV Narcan by EMS with no change in condition. Because it is unknown whether the patient takes chronic benzodiazepines Romazicon  was not given. No one accompanied the patient to the ER.  After arrival to the ER the patient appeared to wake up more although remained nonverbal. He did have brief periods of hypoxemia  prompting application initially of nasal cannula mask but then transitioning to a partial rebreathing/simple mask because of mouth breathing. An ABG was unremarkable--7.47/36/116/26. LFTs were normal. Total CK was elevated at 131 but troponin was normal and lactic acid was normal. Aspirin as well as Tylenol levels were within normal limits. Urinalysis was unremarkable. Alcohol level was less than 5. Urine drug screen positive for benzodiazepines and cocaine. Patient reportedly receiving outpatient medication management from [facility] psychiatry, Dr. Brock Bad  Consultants: Psychiatry  Discharge Exam: Filed Vitals:   10/25/15 0611  BP: 128/90  Pulse: 54  Temp: 98 F (36.7 C)  Resp: 18   Filed Vitals:   10/24/15 1617 10/24/15 1829 10/25/15 0101 10/25/15 0611  BP: 118/79 163/95 140/95 128/90  Pulse: 73 75 66 54  Temp: 98.6 F (37 C) 99.3 F (37.4 C) 98.2 F (36.8 C) 98 F (36.7 C)  TempSrc: Oral Oral Oral Oral  Resp: Height:   (1.727 m)    Weight:  98.8 kg (217 lb 13 oz)    SpO2: 95% 96% 93% 99%   General: A&O x 3, NAD, pleasant, cooperative Cardiovascular: RRR, no rub, no gallop, no S3 Respiratory: CTAB, no wheeze, no rhonchi Abdomen:soft, nontender, nondistended, positive bowel sounds Extremities: No edema, No lymphangitis, no petechiae  Discharge Instructions     Medication List    ASK your doctor about these medications        carvedilol 12.5 MG tablet  Commonly known as:  COREG  Take 12.5 mg by mouth 2 (two) times daily with a meal.         The results of significant diagnostics from this hospitalization (including imaging, microbiology, ancillary and laboratory) are listed below for reference.    Significant Diagnostic Studies: Ct Head Wo Contrast  10/22/2015  CLINICAL DATA:  Recent sleeping pill overdose with apnea EXAM: CT HEAD WITHOUT CONTRAST TECHNIQUE: Contiguous axial images were obtained from the base of the skull through  the vertex without intravenous contrast. COMPARISON:  None. FINDINGS: Bony calvarium is intact. An airway is noted in place. No findings to suggest acute hemorrhage, acute infarction or space-occupying mass lesion are noted this time IMPRESSION: No acute intracranial abnormality noted. Electronically Signed   By: Alcide Clever M.D.   On: 10/22/2015 18:06   Dg Chest Port 1 View  10/22/2015  CLINICAL DATA:  Overdose. EXAM: PORTABLE CHEST 1 VIEW COMPARISON:  None. FINDINGS: Lungs are adequately inflated without focal consolidation or effusion. Cardiomediastinal silhouette is within normal. Spinal stabilization hardware is intact. IMPRESSION: No active disease. Electronically Signed   By: Elberta Fortis M.D.   On: 10/22/2015 16:27     Microbiology: Recent Results (from the past 240 hour(s))  MRSA PCR Screening     Status: Abnormal   Collection Time: 10/22/15  9:19 PM  Result Value Ref Range Status   MRSA by PCR POSITIVE (A) NEGATIVE Final    Comment:        The GeneXpert MRSA Assay (FDA approved for NASAL specimens only), is one component of a comprehensive MRSA colonization surveillance program. It is not intended to diagnose MRSA infection nor to guide or monitor treatment for MRSA infections. RESULT CALLED TO, READ BACK BY AND VERIFIED WITH: RN ZARSONA R. [id number]  Jacquenette Shone  Labs: Basic Metabolic Panel:  Recent Labs Lab 10/22/15 1416 10/22/15 1428 10/23/15 0245 10/24/15 0437 10/25/15 0920  NA 140 145  145 137 138 137  K 3.6 3.5  3.5 3.7 3.7 3.6  CL 107 105  105 106 105 104  CO2 25  --  24 25 24   GLUCOSE 77 74  74 110* 95 89  BUN 10 13  13 7 7 14   CREATININE 1.04 1.00  1.00 0.86 0.98 1.01  CALCIUM 9.2  --  8.5* 8.9 8.9  MG 1.9  --   --  1.5*  --   PHOS  --   --   --  2.2*  --    Liver Function Tests:  Recent Labs Lab 10/22/15 1416 10/23/15 0245 10/24/15 0437  AST 33 23 18  ALT 23 21 18   ALKPHOS 82 68 60  BILITOT 1.0 1.0 1.2  PROT 7.2 6.1* 6.3*    ALBUMIN 4.2 3.8 3.7   No results for input(s): LIPASE, AMYLASE in the last 168 hours. No results for input(s): AMMONIA in the last 168 hours. CBC:  Recent Labs Lab 10/22/15 1416 10/22/15 1428 10/23/15 0245 10/24/15 0437 10/25/15 0920  WBC 10.3  --  14.2* 13.5* 8.2  NEUTROABS 7.0  --   --   --   --   HGB 17.6* 17.3*  17.3* 15.3 15.4 17.2*  HCT 50.7 51.0  51.0 45.6 45.8 50.7  MCV 98.3  --  98.1 98.3 97.5  PLT 203  --  231 227 217   Cardiac Enzymes:  Recent Labs Lab 10/22/15 1416 10/23/15 0245  CKTOTAL 131 69  TROPONINI <0.03  --    BNP: Invalid input(s): POCBNP CBG:  Recent Labs Lab 10/23/15 1943 10/23/15 2329 10/24/15 0407 10/24/15 0726 10/24/15 1615  GLUCAP 77 95 93 94 91    Time coordinating discharge:  Greater than 30 minutes  Signed:  Rajat Staver, [place], DO [facility] Pager: [phone] 10/25/2015, 6:27 PM

## 2015-10-25 NOTE — Progress Notes (Signed)
In the LFA, old laceration cut and surgical sutures sites noted, old wound healing and approximate, "I cut because I am depressed, this doctor took me off my BP medications and pain medications, I am about to go off in a minute and f**k somebody ..." Patient was reassured that his medications will be reviewed and discuss with the doctor. CIWA=0, Scheduled Librium 50 mg given at 2043.

## 2015-10-26 DIAGNOSIS — F1110 Opioid abuse, uncomplicated: Secondary | ICD-10-CM | POA: Diagnosis present

## 2015-10-26 DIAGNOSIS — G894 Chronic pain syndrome: Secondary | ICD-10-CM | POA: Diagnosis present

## 2015-10-26 DIAGNOSIS — F332 Major depressive disorder, recurrent severe without psychotic features: Principal | ICD-10-CM

## 2015-10-26 MED ORDER — CARVEDILOL 6.25 MG PO TABS
12.5000 mg | ORAL_TABLET | Freq: Two times a day (BID) | ORAL | Status: DC
  Administered 2015-10-26 – 2015-10-27 (×2): 12.5 mg via ORAL
  Filled 2015-10-26 (×2): qty 2

## 2015-10-26 MED ORDER — TRAZODONE HCL 100 MG PO TABS: 200.0000 mg | ORAL_TABLET | Freq: Every day | ORAL | Status: AC

## 2015-10-26 MED ORDER — DULOXETINE HCL 60 MG PO CPEP: 60.0000 mg | ORAL_CAPSULE | Freq: Every day | ORAL | Status: AC

## 2015-10-26 MED ORDER — TRAZODONE HCL 100 MG PO TABS
200.0000 mg | ORAL_TABLET | Freq: Every day | ORAL | Status: DC
  Administered 2015-10-26: 200 mg via ORAL
  Filled 2015-10-26: qty 2

## 2015-10-26 MED ORDER — OXYCODONE HCL 5 MG PO TABS
10.0000 mg | ORAL_TABLET | Freq: Four times a day (QID) | ORAL | Status: DC | PRN
  Administered 2015-10-26 – 2015-10-27 (×3): 10 mg via ORAL
  Filled 2015-10-26 (×3): qty 2

## 2015-10-26 MED ORDER — TOPIRAMATE 100 MG PO TABS
100.0000 mg | ORAL_TABLET | Freq: Every day | ORAL | Status: DC
  Administered 2015-10-26: 100 mg via ORAL
  Filled 2015-10-26: qty 1

## 2015-10-26 MED ORDER — OXYCODONE HCL 5 MG PO TABS
30.0000 mg | ORAL_TABLET | Freq: Once | ORAL | Status: AC
  Administered 2015-10-26: 30 mg via ORAL
  Filled 2015-10-26: qty 6

## 2015-10-26 MED ORDER — DULOXETINE HCL 20 MG PO CPEP
60.0000 mg | ORAL_CAPSULE | Freq: Every day | ORAL | Status: DC
  Administered 2015-10-26 – 2015-10-27 (×2): 60 mg via ORAL
  Filled 2015-10-26 (×2): qty 3

## 2015-10-26 NOTE — Progress Notes (Signed)
[facility] MD Progress Note  10/26/2015 7:26 PM Jonathan Perez  MRN:  [id number]  Subjective:  Jonathan Perez is no longer suicidal or homicidal and was to be discharge this afternoon. His wife contacted the SW and expressed concern about her safety. We decided to postpone discharge until family meeting tomorrow. The patient admits that he has been more depressed lately and agreed to start medications. From his chart it is clear that the patient was misleading his providers but there is no record of violence against others. We know that he is no longer welcome at his psychiatrist office as he threw a fit during his last appointment. He will need a new psychiatrist.  Principal Problem: Major depressive disorder, recurrent, severe without psychotic features ([place]) Diagnosis:   Patient Active Problem List   Diagnosis Date Noted  . Opioid use disorder, mild, on maintenance therapy [F11.90] 10/26/2015  . Chronic pain syndrome [G89.4] 10/26/2015  . Cocaine use disorder, moderate, dependence ([place]) [F14.20] 10/25/2015  . Major depressive disorder, recurrent, severe without psychotic features ([place]) [F33.2] 10/25/2015  . Tobacco use disorder [F17.200] 10/25/2015  . Drug overdose [T50.901A] 10/22/2015  . Metabolic encephalopathy [A21.30] 10/22/2015  . QT prolongation [I45.81] 10/22/2015  . Suicide attempt ([place]) [T14.91] 10/22/2015  . Hypertensive urgency [I16.0] 10/22/2015  . Acute hypokalemia [E87.6] 10/22/2015  . Acute respiratory failure with hypoxia (HCC) [J96.01] 10/22/2015   Total Time spent with patient: 1 hour  Past Psychiatric History: depression and chronic pain.  Past Medical History:  Past Medical History  Diagnosis Date  . Bipolar disorder ([place])   . Anxiety   . Hypertension   . Sleep apnea     Past Surgical History  Procedure Laterality Date  . Back surgery  2005, 2009, 2011  . Other surgical history Left 2001, 2011    thumb  . Other surgical history Right 2002    thumb   Family  History: History reviewed. No pertinent family history. Family Psychiatric  History: none reported. Social History:  History  Alcohol Use No     History  Drug Use  . Yes  . Special: Benzodiazepines, Cocaine, Other-see comments, Oxycodone    Social History   Social History  . Marital Status: Single    Spouse Name: N/A  . Number of Children: N/A  . Years of Education: N/A   Social History Main Topics  . Smoking status: Current Every Day Smoker -- 0.25 packs/day    Types: Cigarettes  . Smokeless tobacco: None  . Alcohol Use: No  . Drug Use: Yes    Special: Benzodiazepines, Cocaine, Other-see comments, Oxycodone  . Sexual Activity: Yes   Other Topics Concern  . None   Social History Narrative   Additional Social History:                         Sleep: Fair  Appetite:  Fair  Current Medications: Current Facility-Administered Medications  Medication Dose Route Frequency Provider Last Rate Last Dose  . acetaminophen (TYLENOL) tablet 650 mg  650 mg Oral Q6H PRN Jolanta B Pucilowska, MD      . alum & mag hydroxide-simeth (MAALOX/MYLANTA) 200-200-20 MG/5ML suspension 30 mL  30 mL Oral Q4H PRN Jolanta B Pucilowska, MD      . carvedilol (COREG) tablet 12.5 mg  12.5 mg Oral BID WC Jolanta B Pucilowska, MD   12.5 mg at 10/26/15 1631  . chlordiazePOXIDE (LIBRIUM) capsule 50 mg  50 mg Oral QID Hildred Priest,  MD   50 mg at 10/26/15 1811  . DULoxetine (CYMBALTA) DR capsule 60 mg  60 mg Oral Daily Jolanta B Pucilowska, MD   60 mg at 10/26/15 1127  . magnesium hydroxide (MILK OF MAGNESIA) suspension 30 mL  30 mL Oral Daily PRN Jolanta B Pucilowska, MD      . nicotine (NICODERM CQ - dosed in mg/24 hours) patch 21 mg  21 mg Transdermal Q0600 Clovis Fredrickson, MD   21 mg at 10/26/15 9407  . oxyCODONE (Oxy IR/ROXICODONE) immediate release tablet 10 mg  10 mg Oral Q6H PRN Clovis Fredrickson, MD      . traZODone (DESYREL) tablet 200 mg  200 mg Oral QHS Clovis Fredrickson, MD        Lab Results:  Results for orders placed or performed during the hospital encounter of 10/22/15 (from the past 48 hour(s))  CBC     Status: Abnormal   Collection Time: 10/25/15  9:20 AM  Result Value Ref Range   WBC 8.2 4.0 - 10.5 K/uL   RBC 5.20 4.22 - 5.81 MIL/uL   Hemoglobin 17.2 (H) 13.0 - 17.0 g/dL   HCT 50.7 39.0 - 52.0 %   MCV 97.5 78.0 - 100.0 fL   MCH 33.1 26.0 - 34.0 pg   MCHC 33.9 30.0 - 36.0 g/dL   RDW 14.1 11.5 - 15.5 %   Platelets 217 150 - 400 K/uL  Basic metabolic panel     Status: None   Collection Time: 10/25/15  9:20 AM  Result Value Ref Range   Sodium 137 135 - 145 mmol/L   Potassium 3.6 3.5 - 5.1 mmol/L   Chloride 104 101 - 111 mmol/L   CO2 24 22 - 32 mmol/L   Glucose, Bld 89 65 - 99 mg/dL   BUN 14 6 - 20 mg/dL   Creatinine, Ser 1.01 0.61 - 1.24 mg/dL   Calcium 8.9 8.9 - 10.3 mg/dL   GFR calc non Af Amer >60 >60 mL/min   GFR calc Af Amer >60 >60 mL/min    Comment: (NOTE) The eGFR has been calculated using the CKD EPI equation. This calculation has not been validated in all clinical situations. eGFR's persistently <60 mL/min signify possible Chronic Kidney Disease.    Anion gap 9 5 - 15    Physical Findings: AIMS:  , ,  ,  ,    CIWA:  CIWA-Ar Total: 9 COWS:     Musculoskeletal: Strength & Muscle Tone: within normal limits Gait & Station: broad based Patient leans: N/A  Psychiatric Specialty Exam: Review of Systems  Musculoskeletal: Positive for back pain.  All other systems reviewed and are negative.   Blood pressure 122/94, pulse 88, temperature 98.4 F (36.9 C), temperature source Oral, resp. rate 18, height _0  (1.727 m), weight 97.07 kg (214 lb), SpO2 95 %.Body mass index is 32.55 kg/(m^2).  General Appearance: Fairly Groomed  Engineer, water::  Good  Speech:  Clear and Coherent  Volume:  Normal  Mood:  Depressed  Affect:  Tearful  Thought Process:  Goal Directed  Orientation:  Full (Time, Place, and Person)   Thought Content:  WDL  Suicidal Thoughts:  No  Homicidal Thoughts:  No  Memory:  Immediate;   Good Recent;   Fair Remote;   Fair  Judgement:  Impaired  Insight:  Shallow  Psychomotor Activity:  Normal  Concentration:  Fair  Recall:  [place]  Language: Fair  Akathisia:  No  Handed:  Right  AIMS (if indicated):     Assets:  Communication Skills Desire for Improvement Financial Resources/Insurance Housing Resilience  ADL's:  Intact  Cognition: WNL  Sleep:  Number of Hours: 7.45   Treatment Plan Summary: Daily contact with patient to assess and evaluate symptoms and progress in treatment and Medication management   Mr. Alter is a 39 year old male with a history of depression, anxiety, ADHD, chronic pain and suicide attempt admitted after another suicide attempt by medication overdose in the context of severe pain and social stressors.  1. Suicidal ideation the patient adamantly denies any thoughts intentions or plans to hurt himself or others. He is able to contract for safety.  2. Mood. He is now interested in starting an antidepressant. . We chose Cymbalta for depression, anxiety, and chronic pain. He has a history of treatment with Lithium and Tegretol. Will start Topamax for mood stabilization.   3. Chronic pain. There is information from the chart that the patient is a client at [facility] methadone clinic where he is prescribed 20 mg of methadone daily. He no longer wants to take methadone, rather he wants to go to pain clinic as he finds methadone not helpful. The patient has not been forthcoming with information about his opiate prescriptions. We resumed Oxycodone. We discussed, and the patient seemed to understand, the dangers of combining narcotic painkillers and benzodiazepines.  4. Hypertension. We continue carvedilol.  5. Anxiety. He has been prescribed Xanax by his primary psychiatrist. He was negative for benzodiazepines on admission. She was  given brief Librium detox.  6. Smoking. Nicotine patch was available.  7. Substance abuse. The patient denies drinking alcohol. He denies any problems with cocaine and claims that he took one dose at someone's birthday. He is not interested in substance abuse treatment.  8. Insomnia. This is partly due to pain. He responded well to trazodone.  9. Social. After wife voiced concerns bout her sfety, e invitedher to a meeting.   10. Disposition. He wil be discharged to home hopefully with his family. He will follow up with a new provider. He will follow up with methadone clinic for now until he gets up appointment at the pain clinic.   Jolanta Pucilowska 10/26/2015, 7:26 PM

## 2015-10-26 NOTE — Progress Notes (Signed)
D: Observed pt in room lying in bed. Patient alert and oriented x4. Patient denies SI/HI/AVH. Pt affect is anxious and and rates anxiety as 5 and depression as 1. Pt states "I'm on cloud nine" referring to a positive conversation he had with his wife, and his positive attitude towards toward discharge. Pt calm and cooperative this evening. Pt rates chronic back pain 8/10.  A: Offered active listening and support. Provided therapeutic communication. Administered scheduled medications. Gave Oxy prn for pain.  R: PRN pain medication appears effective as pt asleep. Pt remains calm and cooperative. Will continue Q15 min. checks.

## 2015-10-26 NOTE — Progress Notes (Signed)
Recreation Therapy Notes  INPATIENT RECREATION THERAPY ASSESSMENT  Patient Details Name: Jonathan Perez MRN: [id number] DOB: 05/21/1976 Today's Date: 10/26/2015  Patient Stressors: Other (Comment) (Bills, not being able to provide, living below the standard, putting stress on his wife)  Coping Skills:   Talking, Music  Personal Challenges: Anger, Communication, Concentration, Stress Management, Trusting Others  Leisure Interests (2+):  Individual - Other (Comment), Nature - ArchitectHunting (Ride motorcycles)  Biochemist, clinicalAwareness of Community Resources:  Yes  Community Resources:  Other (Comment) [facility](Community Center)  Current Use: Yes  If no, Barriers?:    Patient Strengths:  Caring, decisive  Patient Identified Areas of Improvement:  Stress management, getting over depression of disability  Current Recreation Participation:  Fishing  Patient Goal for Hospitalization:  To get on an antidepressant and face his depression head on  [place]idence[place]  [place]  [place] Residence[place]   Current [place] (including self-harm):  No  Current HI:  No  Consent to Intern Participation: N/A   Jacquelynn CreeGreene,Taran Haynesworth M, LRT/CTRS 10/26/2015, 2:42 PM

## 2015-10-26 NOTE — Progress Notes (Signed)
Disruptive, slamming doors, threatening, hostile, angry "I am as well get the f**k out of here...." Patient can not be trusted.

## 2015-10-26 NOTE — Plan of Care (Signed)
Problem: Diagnosis: Increased Risk For Suicide Attempt Goal: STG-Patient Will Comply With Medication Regime Outcome: Progressing Pt cooperative and taking all scheduled medications.

## 2015-10-26 NOTE — [facility] Group Notes (Signed)
[facility] Group Notes:  (Nursing/MHT/Case Management/Adjunct)  Date:  10/26/2015  Time:  11:50 AM  Type of Therapy:  Psychoeducational Skills  Participation Level:  Did Not Attend   Lynelle SmokeCara Travis [facility] 10/26/2015, 11:50 AM

## 2015-10-26 NOTE — [facility] Suicide Risk Assessment (Signed)
[facility] INPATIENT:  Family/Significant Other Suicide Prevention Education  Suicide Prevention Education:  Education Completed; Jonathan BarnsBecky Perez (wife) [phone] has been identified by the patient as the family member/significant other with whom the patient will be residing, and identified as the person(s) who will aid the patient in the event of a mental health crisis (suicidal ideations/suicide attempt).  With written consent from the patient, the family member/significant other has been provided the following suicide prevention education, prior to the and/or following the discharge of the patient.  The suicide prevention education provided includes the following:  Suicide risk factors  Suicide prevention and interventions  National Suicide Hotline telephone number  [facility] assessment telephone number  [facility] Emergency Assistance 911  [facility] and/or Residential Mobile Crisis Unit telephone number  Request made of family/significant other to:  Remove weapons (e.g., guns, rifles, knives), all items previously/currently identified as safety concern.    Remove drugs/medications (over-the-counter, prescriptions, illicit drugs), all items previously/currently identified as a safety concern.  The family member/significant other verbalizes understanding of the suicide prevention education information provided.  The family member/significant other agrees to remove the items of safety concern listed above.  Lulu RidingIngle, Denton Derks T, MSW, LCSWA 10/26/2015, 3:56 PM

## 2015-10-26 NOTE — Progress Notes (Signed)
Recreation Therapy Notes  Date: 11.16.16 Time: 3:00 pm Location: Craft Room  Group Topic: Self-esteem  Goal Area(s) Addresses:  Patient will write down at least one positive trait. Patient will verbalize benefit of having a healthy self-esteem.  Behavioral Response: Did not attend  Intervention: I Am  Activity: Patients were given a worksheet with the letter I on it and instructed to write as many positive traits about themselves as they could inside the letter.  Education: LRT educated patients on ways they can increase their self-esteem.  Education Outcome: Patient did not attend group.  Clinical Observations/Feedback: Patient did not attend group.  Jacquelynn CreeGreene,Trace Wirick M, LRT/CTRS 10/26/2015 4:14 PM

## 2015-10-26 NOTE — Plan of Care (Signed)
Problem: [facility] Participation in Recreation Therapeutic Interventions Goal: STG-Patient will identify at least five coping skills for ** STG: Coping Skills - Within 3 treatment sessions, patient will verbalize at least 5 coping skills for anger in one treatment session to increase anger management skills post d/c. Outcome: Completed/Met Date Met:  10/26/15 Treatment Session 1; Completed 1 out of 1: At approximately 11:40 am, LRT met with patient in craft room. Patient verbalized 5 coping skills for anger. Patient verbalized what triggers him to get angry, how his body responds to anger, and how he is going to remember to use his healthy coping skills. LRT provided suggestions as well. Intervention Used: Coping Skills worksheet  Leonette Monarch, LRT/CTRS 11.16.16 2:46 pm Goal: STG-Other Recreation Therapy Goal (Specify) STG: Stress Management - Within 3 treatment sessions, patient will verbalize understanding of the stress management techniques in one treatment session to increase stress management skills post d/c. Outcome: Completed/Met Date Met:  10/26/15 Treatment Session 1; Completed 1 out of 1: At approximately 11:40 am, LRT met with patient in craft room. LRT educated and provided patient with handouts on stress management techniques. Patient verbalized understanding. LRT encouraged patient to read over and practice the stress management techniques. Intervention Used: Mindfulness and Progressive Muscle Relaxation handouts  Leonette Monarch, LRT/CTRS 11.16.16 2:48 pm

## 2015-10-26 NOTE — [facility] Suicide Risk Assessment (Addendum)
[name] Admission Suicide Risk Assessment   Nursing information obtained from:  Patient Demographic factors:  Male, Unemployed Current Mental Status:  Self-harm behaviors Loss Factors:  Financial problems / change in socioeconomic status, Decline in physical health Historical Factors:  Prior suicide attempts Risk Reduction Factors:  Positive social support, Living with another person, especially a relative Total Time spent with patient: 1 hour Principal Problem: Major depressive disorder, recurrent, severe without psychotic features (HCC) Diagnosis:   Patient Active Problem List   Diagnosis Date Noted  . Opioid use disorder, mild, on maintenance therapy [F11.90] 10/26/2015  . Chronic pain syndrome [G89.4] 10/26/2015  . Cocaine use disorder, moderate, dependence (HCC) [F14.20] 10/25/2015  . Major depressive disorder, recurrent, severe without psychotic features (HCC) [F33.2] 10/25/2015  . Tobacco use disorder [F17.200] 10/25/2015  . Drug overdose [T50.901A] 10/22/2015  . Metabolic encephalopathy [G93.41] 10/22/2015  . QT prolongation [I45.81] 10/22/2015  . Suicide attempt (HCC) [T14.91] 10/22/2015  . Hypertensive urgency [I16.0] 10/22/2015  . Acute hypokalemia [E87.6] 10/22/2015  . Acute respiratory failure with hypoxia (HCC) [J96.01] 10/22/2015     Continued Clinical Symptoms:  Alcohol Use Disorder Identification Test Final Score (AUDIT): 4 The "Alcohol Use Disorders Identification Test", Guidelines for Use in Primary Care, Second Edition.  World Science writer [name]). Score between 0-7:  no or low risk or alcohol related problems. Score between 8-15:  moderate risk of alcohol related problems. Score between 16-19:  high risk of alcohol related problems. Score 20 or above:  warrants further diagnostic evaluation for alcohol dependence and treatment.   CLINICAL FACTORS:   Depression:   Impulsivity Insomnia Severe Chronic Pain   Musculoskeletal: Strength & Muscle Tone:  within normal limits Gait & Station: broad based Patient leans: N/A  Psychiatric Specialty Exam: Physical Exam  Nursing note and vitals reviewed. Constitutional: He is oriented to person, place, and time. He appears well-developed and well-nourished.  HENT:  Head: Normocephalic and atraumatic.  Eyes: Conjunctivae and EOM are normal. Pupils are equal, round, and reactive to light.  Neck: Normal range of motion. Neck supple.  Cardiovascular: Normal rate, regular rhythm and normal heart sounds.   Respiratory: Effort normal and breath sounds normal.  GI: Soft. Bowel sounds are normal.  Musculoskeletal: Normal range of motion.  Neurological: He is alert and oriented to person, place, and time.  Skin: Skin is warm and dry.    Review of Systems  Musculoskeletal: Positive for back pain.  All other systems reviewed and are negative.   Blood pressure 119/89, pulse 90, temperature 97.7 F (36.5 C), temperature source Oral, resp. rate 22, height  (1.727 m), weight 97.07 kg (214 lb), SpO2 95 %.Body mass index is 32.55 kg/(m^2).  General Appearance: Casual  Eye Contact::  Good  Speech:  Clear and Coherent  Volume:  Normal  Mood:  Euthymic  Affect:  Appropriate  Thought Process:  Goal Directed  Orientation:  Full (Time, Place, and Person)  Thought Content:  WDL  Suicidal Thoughts:  No  Homicidal Thoughts:  No  Memory:  Immediate;   Fair Recent;   Fair Remote;   Fair  Judgement:  Impaired  Insight:  Fair  Psychomotor Activity:  Normal  Concentration:  Fair  Recall:  [employer] of Knowledge:Fair  Language: Fair  Akathisia:  No  Handed:  Right  AIMS (if indicated):     Assets:  Communication Skills Desire for Improvement Financial Resources/Insurance Housing Intimacy Resilience Social Support Transportation  Sleep:  Number of Hours: 7.45  Cognition:  WNL  ADL's:  Intact     COGNITIVE FEATURES THAT CONTRIBUTE TO RISK:  None    SUICIDE RISK:   Minimal: No  identifiable suicidal ideation.  Patients presenting with no risk factors but with morbid ruminations; may be classified as minimal risk based on the severity of the depressive symptoms  PLAN OF CARE: Hospital admission, medication management, substance abuse counseling, discharge planning.  Medical Decision Making:  New problem, with additional work up planned, Review of Psycho-Social Stressors (1), Review or order clinical lab tests (1), Review of Medication Regimen & Side Effects (2) and Review of New Medication or Change in Dosage (2)   Jonathan Perez is a 39 year old male with a history of depression, anxiety, ADHD, chronic pain and suicide attempt admitted after another suicide attempt by medication overdose in the context of severe pain and social stressors.  1. Suicidal ideation the patient adamantly denies any thoughts intentions or plans to hurt himself or others. He is able to contract for safety.  2. Mood. He is now interested in starting an antidepressant. . We chose Cymbalta for depression, anxiety, and chronic pain.  3. Chronic pain. There is information from the chart that the [facility] where he is prescribed 20 mg of methadone daily. He no longer wants to take methadone, rather he wants to go to pain clinic as he finds methadone most helpful. The patient has not been forthcoming with information about his opiate prescriptions. He was given 1 dose of Oxycodone prior to discharge. We discussed and the patient seemed to understand the dangers of combining narcotic painkillers and benzodiazepines.  4. Hypertension. We will continue carvedilol.  5. Anxiety. He has been prescribed Xanax by his primary psychiatrist. He was negative for benzodiazepines on admission. She was given brief Librium detox.  6. Smoking. Nicotine patch was available.  7. Substance abuse. The patient denies drinking alcohol. He denies any problems with cocaine and claims  that he took one dose at someone's birthday. He is not interested in substance abuse treatment.  8. Insomnia. This is partly due to pain. He responded well to trazodone.  9. Disposition. He was discharged to home with his family. He will follow up with his regular psychiatrist Dr. Blima Led[facility]athryn McDonald [facility]. He will follow up with methadone clinic for now until he gets up appointment at the pain clinic.   6. ADHD. No stimulants were offered.   I certify that inpatient services furnished can reasonably be expected to improve the patient's condition.   Jonathan Perez 10/26/2015, 11:08 AM

## 2015-10-26 NOTE — Plan of Care (Signed)
Problem: Ineffective individual coping Goal: STG: Patient will remain free from self harm Outcome: Progressing Denies suicidal ideations.     

## 2015-10-26 NOTE — [facility] Group Notes (Signed)
[facility] Note   10/26/2015 11:01 AM  Patient did not attend.  Jenel LucksJasmine Perez, Clinical Social Work Intern 10/26/15  Beryl MeagerJason Vedha Perez, MSW, LCSWA 10/26/15
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
## 2015-10-26 NOTE — [facility] Group Notes (Signed)
[facility] LCSW Group Therapy  10/26/2015 4:32 PM  Type of Therapy:  Group Therapy  Participation Level:  Did Not Attend  Modes of Intervention:  Discussion, Education, Socialization and Support  Summary of Progress/Problems: Emotional Regulation: Patients will identify both negative and positive emotions. They will discuss emotions they have difficulty regulating and how they impact their lives. Patients will be asked to identify healthy coping skills to combat unhealthy reactions to negative emotions.     Sael Furches L Zada Haser MSW, LCSWA  10/26/2015, 4:32 PM 
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
## 2015-10-26 NOTE — BHH Suicide Risk Assessment (Addendum)
[facility]   Demographic Factors:  Male and Caucasian  Total Time spent with patient: 1 hour  Musculoskeletal: Strength & Muscle Tone: within normal limits Gait & Station: broad based Patient leans: N/A  Psychiatric Specialty Exam: Physical Exam  Nursing note and vitals reviewed.   Review of Systems  Musculoskeletal: Positive for back pain.  All other systems reviewed and are negative.   Blood pressure 119/89, pulse 90, temperature 97.7 F (36.5 C), temperature source Oral, resp. rate 22, height 5\' 8"  (1.727 m), weight 97.07 kg (214 lb), SpO2 95 %.Body mass index is 32.55 kg/(m^2).  See SRA.                                                     He states that he is frequently tired and psychiatrist. He everything he he said is not very dose of pain medication Have you used any form of tobacco in the last 30 days? (Cigarettes, Smokeless Tobacco, Cigars, and/or Pipes): Yes  Has this patient used any form of tobacco in the last 30 days? (Cigarettes, Smokeless Tobacco, Cigars, and/or Pipes) Yes, A prescription for an FDA-approved tobacco cessation medication was offered at discharge and the patient refused  Mental Status Per Nursing Assessment::   On Admission:  Self-harm behaviors  Current Mental Status by Physician: NA  Loss Factors: Decline in physical health  Historical Factors: Prior suicide attempts and Impulsivity  Risk Reduction Factors:   Responsible for children under 41 years of age, Sense of responsibility to family, Living with another person, especially a relative, Positive social support and Positive therapeutic relationship  Continued Clinical Symptoms:  Depression:   Comorbid alcohol abuse/dependence Impulsivity Alcohol/Substance Abuse/Dependencies  Cognitive Features That Contribute To Risk:  None    Suicide Risk:  Minimal: No identifiable suicidal ideation.  Patients presenting with no  risk factors but with morbid ruminations; may be classified as minimal risk based on the severity of the depressive symptoms  Principal Problem: Major depressive disorder, recurrent, severe without psychotic features [facility] [facility](HCC) Discharge Diagnoses:  Patient Active Problem List   Diagnosis Date Noted  . Opioid use disorder, mild, on maintenance therapy [F11.90] 10/26/2015  . Chronic pain syndrome [G89.4] 10/26/2015  . Cocaine use disorder, moderate, dependence ([facility]) [F14.20] 10/25/2015  . Major depressive disorder, recurrent, severe without psychotic features ([facility]) [F33.2] 10/25/2015  . Tobacco use disorder [F17.200] 10/25/2015  . Drug overdose [T50.901A] 10/22/2015  . Metabolic encephalopathy [G93.41] 10/22/2015  . QT prolongation [I45.81] 10/22/2015  . Suicide attempt ([facility]) [T14.91] 10/22/2015  . Hypertensive urgency [I16.0] 10/22/2015  . Acute hypokalemia [E87.6] 10/22/2015  . Acute respiratory failure with hypoxia ([facility]) [J96.01] 10/22/2015      Plan Of Care/Follow-up recommendations:  Activity:  As tolerated. Diet:  Low sodium heart healthy. Other:  Keep follow-up appointments.  Is patient on multiple antipsychotic therapies at discharge:  No   Has Patient had three or more failed trials of antipsychotic monotherapy by history:  No  Recommended Plan for Multiple Antipsychotic Therapies: NA    Trysta Showman 10/26/2015, 11:33 AM

## 2015-10-26 NOTE — Progress Notes (Signed)
D: Stated appetite is good and energy level  Is normal. Stated concentration is good . Stated on Depression scale 0 , hopeless 0 and anxiety 10.( low 0-10 high) Denies suicidal  homicidal ideations  .  No auditory hallucinations  No pain concerns . Appropriate ADL'S. Interacting with peers and staff. Patient was told of discharge , but rinformed this was cancelled. Patient voice of his wife lying to him ,upset crying about her leaving him. A: Encourage patient participation with unit programming . Instruction  Given on  Medication , verbalize understanding. R: Voice no other concerns. Staff continue to monitor

## 2015-10-26 NOTE — Progress Notes (Signed)
   10/26/15 0910  Clinical Encounter Type  Visited With Patient;Patient not available  Visit Type Initial;Behavioral Health  Referral From Nurse  Consult/Referral To Chaplain  Patient had been reported  presenting aggressive and confrontational behavior. Patient was sleeping upon my visit. Consulting with MD, I did not wake the patient, but advised med staff to notify me if patient did desire to meet w/chaplain. Chap. Willis Holquin G. Genova Kiner, ext. [phone]

## 2015-10-27 MED ORDER — TOPIRAMATE 100 MG PO TABS: 100.0000 mg | ORAL_TABLET | Freq: Every day | ORAL | Status: DC

## 2015-10-27 NOTE — BHH Counselor (Signed)
Adult Comprehensive Assessment  Patient ID: Ardis HughsDavid B Bodin, male   DOB: 08/24/1976, 39 y.o.   MRN: [id number]  Information Source: Information source: Patient  Current Stressors:     Living/Environment/Situation:  Living Arrangements: Spouse/significant other What is atmosphere in current home: Supportive  Family History:  Marital status: Married  Childhood History:     Education:  Currently a Consulting civil engineerstudent?: No Learning disability?: No  Employment/Work Situation:   Employment situation: On disability  Architectinancial Resources:   Financial resources: Insurance claims handlereceives SSDI Does patient have a Lawyerrepresentative payee or guardian?: No  Alcohol/Substance Abuse:   What has been your use of drugs/alcohol within the last 12 months?: denies  Social Support System:   Lub[employer]ol CorporationPatient's Community Support System: Fair  Leisure/Recreation:      Strengths/Needs:      Discharge Plan:   Does patient have access to transportation?: No Plan for no access to transportation at discharge: cab voucher Will patient be returning to same living situation after discharge?: Yes Currently receiving community mental health services: Yes (From Whom) ([facility] ) Does patient have financial barriers related to discharge medications?: No  Summary/Recommendations:  Patient is a married 39 year old admitted for depression and SI and denies substance abuse however patient has fused spinal discs and suffer from chronic pain. Patient has been receiving Methadone for pain but no longer wants Methadone and [facility] [place]. Patient is married has been living with his wife Ulysees BarnsBecky Zaman (wife) [phone] but she was scheduled to meet with this Clinical research associatewriter today for family meeting but did not show and has not answered several phone calls and is not returning calls today either. Patient is encouraged to participate in med management, group therapy, and therapeutic milieu but will likely  discharge today.     Lulu RidingIngle, Lauralie Blacksher T., MSW, Theresia MajorsLCSWA  10/27/2015

## 2015-10-27 NOTE — Progress Notes (Signed)
Recreation Therapy Notes  INPATIENT RECREATION TR PLAN  Patient Details Name: Jonathan Perez MRN: [id number] DOB: 1976/04/14 Today's Date: 10/27/2015  Rec Therapy Plan Is patient appropriate for Therapeutic Recreation?: Yes Treatment times per week: At least once a week TR Treatment/Interventions: 1:1 session, Group participation (Comment) (Appropriate participation in daily recreation therapy tx)  Discharge Criteria Pt will be discharged from therapy if:: Treatment goals are met, Discharged Treatment plan/goals/alternatives discussed and agreed upon by:: Patient/family  Discharge Summary Short term goals set: See Care Plan Short term goals met: Complete Progress toward goals comments: One-to-one attended Which groups?: Leisure education One-to-one attended: Stress management, anger management Reason goals not met: N/A Therapeutic equipment acquired: None Reason patient discharged from therapy: Discharge from hospital Pt/family agrees with progress & goals achieved: Yes Date patient discharged from therapy: 10/27/15   Leonette Monarch, LRT/CTRS 10/27/2015, 4:31 PM

## 2015-10-27 NOTE — Tx Team (Signed)
Interdisciplinary Treatment Plan Update (Adult)  Date:  10/27/2015 Time Reviewed:  3:21 PM  Progress in Treatment: Attending groups: No. Participating in groups:  No. Taking medication as prescribed:  Yes. Tolerating medication:  Yes. Family/Significant othe contact made:  Yes, individual(s) contacted:  Webb Weed (wife) [phone] Patient understands diagnosis:  Yes. Discussing patient identified problems/goals with staff:  Yes. Medical problems stabilized or resolved:  Yes. Denies suicidal/homicidal ideation: Yes. Issues/concerns per patient self-inventory:  No. Other:  New problem(s) identified: No, Describe:  none reported  Discharge Plan or Barriers:Patient was admitted for SI and depression and his wife was suppose to meet for family meeting today as she was concnered for patient however she did not answer or call this Probation officer today so patient is discharged home as planned yesterday and discussed with the wife Jacqlyn Larsen with this Probation officer. Patient can no longer see Dr. Sherryle Lis for psychiatry so patient is referred to [facility] in [place] and is provided with cab voucher for transportation as patient is not able to attain a ride. Patient has Medicaid and Medicare.   Reason for Continuation of Hospitalization: Depression Suicidal ideation  Comments:  Estimated length of stay:0 days expected disccharge 10/27/15  New goal(s):  Review of initial/current patient goals per problem list:   1.  Goal(s):particiapte in care planning  Met:  Yes  Target date:by discharge  2.  Goal (s):decrease depression  Met:  Yes  Target date:by discharge  3.  Goal(s):eliminate SI  Met:  Yes  Target date:by discharge  Attendees: Physician:  Orson Slick, MD 11/17/20163:21 PM  Nursing:   Polly Cobia, RN 11/17/20163:21 PM  Other:  Carmell Austria, LCSWA 11/17/20163:21 PM  Other:   11/17/20163:21 PM  Other:   11/17/20163:21 PM  Other:  11/17/20163:21 PM  Other:  11/17/20163:21 PM   Other:  11/17/20163:21 PM  Other:  11/17/20163:21 PM  Other:  11/17/20163:21 PM  Other:  11/17/20163:21 PM  Other:   11/17/20163:21 PM   Scribe for Treatment Team:   Carmell Austria T, 10/27/2015, 3:21 PM

## 2015-10-27 NOTE — Progress Notes (Signed)
Recreation Therapy Notes  Date: 11.17.16 Time: 3:00 pm Location: Craft Room  Group Topic: Leisure Education  Goal Area(s) Addresses:  Patient will identify activities for each letter of the alphabet. Patient will verbalize ability to integrate positive leisure into life post d/c. Patient will verbalize ability to use leisure as a Associate Professorcoping skill.  Behavioral Response: Attentive, Interactive, Left early  Intervention: Leisure Alphabet  Activity: Patients were given a Leisure Information systems managerAlphabet worksheet and instructed to write healthy leisure activities for each letter of the alphabet.  Education: LRT educated patients on what is needed to participate in leisure.  Education Outcome: Acknowledges education/In group clarification offered  Clinical Observations/Feedback: Patient completed activity by writing healthy leisure activities. Patient contributed to group discussion by stating some of the healthy leisure activities he wrote down, and what is needed to participate in leisure. Patient left group at approximately 3:30 pm with nursing. Patient did not return to group.  Jacquelynn CreeGreene,Jonathan Perez, LRT/CTRS 10/27/2015 4:29 PM

## 2015-10-27 NOTE — Progress Notes (Signed)
  [facility] Management Discharge Plan :  Will you be returning to the same living situation after discharge:  Yes,  home with friend and wife At discharge, do you have transportation home?: No. Patient is provided with cab voucher as patient does not live in bus system routes Do you have the ability to pay for your medications: Yes,  patient has Medicare and Medicaid  Release of information consent forms completed and in the chart;  Patient's signature needed at discharge.  Patient to Follow up at: Follow-up Information    Call [facility].   Why:  Patient referral sent and awaiting a callback for appt for pain management appointment.    Contact information:   [address] [place], [place] [address] [phone] Fax [phone]      Follow up with [facility]. Go on 10/31/2015.   Why:  For follow-up care appt Monday 10/31/15 at 8:00am. This appt is for assessment and is a walk in appt. Bring ID, Medication List, and insurance card to appt.  (walk in hours are M-F 8-4)    Contact information:   [address] [place], [place] Ph [phone] Fax [phone]       Next level of care provider has [facility] to [facility] Link:no  Patient denies SI/HI: Yes,  patient denies SI/HI    Safety Planning and Suicide Prevention discussed: Yes,  SPE discussed with patient and Ulysees BarnsBecky Vanderpool (wife) [phone]   Have you used any form of tobacco in the last 30 days? (Cigarettes, Smokeless Tobacco, Cigars, and/or Pipes): Yes  Has patient been referred to the Quitline?: Patient refused referral  Lulu Ridingngle, Alley Neils T, MSW, LCSWA 10/27/2015, 3:19 PM

## 2015-10-27 NOTE — [facility] Group Notes (Signed)
[facility] Group Notes:  (Nursing/MHT/Case Management/Adjunct)  Date:  10/27/2015  Time:  1:45 PM  Type of Therapy:  Psychoeducational Skills  Participation Level:  Did Not Attend    Jonathan Perez Ellison 10/27/2015, 1:45 PM

## 2015-10-27 NOTE — Progress Notes (Signed)
D:Patient aware of discharge this shift . Patient returning home . Patient received all belonging locked up . Patient denies  Suicidal  And homicidal ideations  .  A: Writer instructed on discharge criteria  . Received prescriptions   . Aware  Of follow up appointment . R: Patient left unit with no questions via taxi .

## 2015-10-28 ENCOUNTER — Encounter (HOSPITAL_COMMUNITY): Payer: Self-pay | Admitting: Emergency Medicine

## 2015-11-26 ENCOUNTER — Ambulatory Visit (HOSPITAL_COMMUNITY)
Admission: RE | Admit: 2015-11-26 | Discharge: 2015-11-26 | Disposition: A | Discharge status: Home / Self Care | Payer: Medicaid Other | Attending: Psychiatry | Admitting: Psychiatry

## 2015-11-26 ENCOUNTER — Encounter (HOSPITAL_COMMUNITY): Payer: Self-pay

## 2015-11-26 ENCOUNTER — Emergency Department (HOSPITAL_COMMUNITY)
Admission: EM | Admit: 2015-11-26 | Discharge: 2015-11-26 | Discharge status: Against Medical Advice | Payer: Medicare Other | Attending: Emergency Medicine | Admitting: Emergency Medicine

## 2015-11-26 DIAGNOSIS — F17210 Nicotine dependence, cigarettes, uncomplicated: Secondary | ICD-10-CM | POA: Diagnosis not present

## 2015-11-26 DIAGNOSIS — G8929 Other chronic pain: Secondary | ICD-10-CM | POA: Insufficient documentation

## 2015-11-26 DIAGNOSIS — I10 Essential (primary) hypertension: Secondary | ICD-10-CM | POA: Diagnosis not present

## 2015-11-26 DIAGNOSIS — Z008 Encounter for other general examination: Secondary | ICD-10-CM | POA: Insufficient documentation

## 2015-11-26 LAB — COMPREHENSIVE METABOLIC PANEL
ALT: 16 U/L — AB (ref 17–63)
AST: 18 U/L (ref 15–41)
Albumin: 4 g/dL (ref 3.5–5.0)
Alkaline Phosphatase: 125 U/L (ref 38–126)
Anion gap: 10 (ref 5–15)
BUN: 12 mg/dL (ref 6–20)
CHLORIDE: 112 mmol/L — AB (ref 101–111)
CO2: 18 mmol/L — AB (ref 22–32)
Calcium: 9 mg/dL (ref 8.9–10.3)
Creatinine, Ser: 1.26 mg/dL — ABNORMAL HIGH (ref 0.61–1.24)
GFR calc Af Amer: >60 mL/min (ref >60)
Glucose, Bld: 99 mg/dL (ref 65–99)
POTASSIUM: 3.4 mmol/L — AB (ref 3.5–5.1)
SODIUM: 140 mmol/L (ref 135–145)
Total Bilirubin: 0.9 mg/dL (ref 0.3–1.2)
Total Protein: 7.1 g/dL (ref 6.5–8.1)

## 2015-11-26 LAB — CBC
HEMATOCRIT: 51.8 % (ref 39.0–52.0)
Hemoglobin: 18.2 g/dL — ABNORMAL HIGH (ref 13.0–17.0)
MCH: 34.1 pg — AB (ref 26.0–34.0)
MCHC: 35.1 g/dL (ref 30.0–36.0)
MCV: 97 fL (ref 78.0–100.0)
Platelets: 233 10*3/uL (ref 150–400)
RBC: 5.34 MIL/uL (ref 4.22–5.81)
RDW: 14.2 % (ref 11.5–15.5)
WBC: 11 10*3/uL — AB (ref 4.0–10.5)

## 2015-11-26 LAB — RAPID URINE DRUG SCREEN, HOSP PERFORMED
AMPHETAMINES: NOT DETECTED
BARBITURATES: NOT DETECTED
Benzodiazepines: POSITIVE — AB
Cocaine: POSITIVE — AB
OPIATES: NOT DETECTED
TETRAHYDROCANNABINOL: NOT DETECTED

## 2015-11-26 LAB — ETHANOL

## 2015-11-26 NOTE — BH Assessment (Addendum)
Tele Assessment Note   Jonathan Perez is an 39 y.o. male [facility] father, [name], at the pt's request after having a seizure this morning (11/26/15). Pt reports that he has been using Alprazolam for 5 years and when his wife left him on 11/13/15, she took the rest of his medication with him. Pt reports that he thought he could withdrawn without a problem but he had a seizure which frightened him into coming to the hospital. Pt reports that since the last time he took the Alprazolam he has been nauseous, having diarrhea, double vision, and unsteady. Pt reports that he has been seeing Dr. Lethea Killingsaroline McDonald as a psychiatrist and that he has another prescription for Alprazolam but it cannot be filled till 12/14/15. Pt reports that he was suicidal a few months ago and tried to overdose on blood pressure medications at that time. Pt reports that he went inpatient for a few days at that time. Pt denies SI, HI, A/V hallucinations, delusions, and SA. Pt reports that he has not been eating or sleeping in the last few days and lost 20lbs in the last 3 months.   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 [name] reported that the pt attempted suicide twice a few months ago.   Per Fransisca KaufmannLaura Davis, NP, pt needs to be medically cleared before any further disposition can be determined. [facility].      Diagnosis: F31.32 Bipolar I disorder, current or most recent episode depressed, moderate  Past Medical History:  Past Medical History  Diagnosis Date  . Chronic back pain   . Kidney stones   . Kidney stones   . ADD (attention deficit disorder)   . Methadone use (HCC)   . Bipolar disorder (HCC)   . Anxiety   . Hypertension   . Sleep apnea     Past Surgical History  Procedure Laterality Date  . Back surgery    . Hand surgery    . Hip surgery    . Back  surgery  2005, 2009, 2011  . Other surgical history Left 2001, 2011    thumb  . Other surgical history Right 2002    thumb    Family History:  Family History  Problem Relation Age of Onset  . Diabetes Other   . Stroke Other   . Diabetes Mother     Social History:  reports that he has been smoking Cigarettes.  He has been smoking about 0.25 packs per day. He does not have any smokeless tobacco history on file. He reports that he uses illicit drugs (Benzodiazepines, Cocaine, Other-see comments, and Oxycodone). He reports that he does not drink alcohol.  Additional Social History:  Alcohol / Drug Use Pain Medications: pt denies Prescriptions: pt denies Over the Counter: pt denies Withdrawal Symptoms: Diarrhea, Irritability, Seizures, Nausea / Vomiting Onset of Seizures: 11/26/15 Date of most recent seizure: 11/26/15  CIWA:   COWS:    PATIENT STRENGTHS: (choose at least two) Capable of independent living Supportive family/friends Work skills  Allergies:  Allergies  Allergen Reactions  . Toradol [Ketorolac Tromethamine] Itching    Patient stated he would not take this medication ever again   . Reglan [Metoclopramide] Itching  . Reglan [Metoclopramide] Other (See Comments)    Patient stated he can't take this due to  burning in his GI tract.   . Toradol [Ketorolac Tromethamine] Itching    Home Medications:  (Not in a hospital admission)  OB/GYN Status:  No LMP for male patient.  General Assessment Data Location of Assessment: [facility] Assessment Services TTS Assessment: In system Is this a Tele or Face-to-Face Assessment?: Face-to-Face Is this an Initial Assessment or a Re-assessment for this encounter?: Initial Assessment Marital status: Married Is patient pregnant?: No Pregnancy Status: No Living Arrangements: Spouse/significant other Can pt return to current living arrangement?: Yes Admission Status: Voluntary Is patient capable of signing voluntary admission?:  Yes Referral Source: Self/Family/Friend Insurance type: medicare  Medical Screening Exam [facility] Walk-in ONLY) Medical Exam completed: No  Crisis Care Plan Living Arrangements: Spouse/significant other  Education Status Is patient currently in school?: No Highest grade of school patient has completed: high school  Risk to self with the past 6 months Suicidal Ideation: No-Not Currently/Within Last 6 Months Has patient been a risk to self within the past 6 months prior to admission? : No Suicidal Intent: No-Not Currently/Within Last 6 Months Has patient had any suicidal intent within the past 6 months prior to admission? : No Is patient at risk for suicide?: No Suicidal Plan?: No-Not Currently/Within Last 6 Months Has patient had any suicidal plan within the past 6 months prior to admission? 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 : No Thoughts of Harm to Others: No-Not Currently Present/Within Last 6 Months Current Homicidal Intent: No Current Homicidal Plan: No Access to Homicidal Means: No Identified Victim: none History of harm to others?: No Assessment of Violence:  None Noted Does patient have access to weapons?: No Criminal Charges Pending?: No Does patient have a court date: No Is patient on probation?: No  Psychosis Hallucinations: None noted Delusions: None noted  Mental Status Report Appearance/Hygiene: Body odor, Disheveled Eye Contact: Good Motor Activity: Restlessness, Freedom of movement Speech: Logical/coherent Level of Consciousness: Quiet/awake Mood: Depressed, Anxious, Sad, Helpless Affect: Blunted Anxiety Level: Moderate Thought Processes: Coherent, Relevant Judgement: Impaired Orientation: Person, Time, Place, Situation Obsessive Compulsive Thoughts/Behaviors: None  Cognitive Functioning Concentration: Decreased Memory: Remote Intact, Recent Impaired IQ: Average Insight: Good Impulse Control: Fair Appetite: Poor Weight Loss: 20 Sleep: Decreased Total Hours of Sleep: 0 Vegetative Symptoms: None  ADLScreening [facility] Assessment Services) Patient's cognitive ability adequate to safely complete daily activities?: Yes Patient able to express need for assistance with ADLs?: Yes Independently performs ADLs?: Yes (appropriate for developmental age)  Prior Inpatient Therapy Prior Inpatient Therapy: Yes Prior Therapy Dates: 2016 Prior [facility] Reason for Treatment: SI  Prior Outpatient Therapy Prior Outpatient Therapy: Yes Prior Therapy Dates: ongoing Prior Therapy Facilty/Provider(s): Dr. Rayfield [facility] Reason for Treatment: anxiety, depression Does patient have an ACCT team?: No Does patient have Intensive In-House Services?  : No Does patient have  [facility] services? : No Does patient have P4CC services?: No  ADL Screening (condition at time of admission) Patient's cognitive ability adequate to safely complete daily activities?: Yes Is the patient deaf or have difficulty hearing?: No Does the patient have difficulty seeing, even when wearing glasses/contacts?:  No Does the patient have difficulty concentrating, remembering, or making decisions?: Yes Patient able to express need for assistance with ADLs?: Yes Does the patient have difficulty dressing or bathing?: No Independently performs ADLs?: Yes (appropriate for developmental age) Does the patient have difficulty walking or climbing stairs?: No       Abuse/Neglect Assessment (Assessment to be complete while patient is alone) Physical Abuse: Denies Verbal Abuse: Denies Sexual Abuse: Denies Exploitation of patient/patient's resources: Denies Self-Neglect: Denies Values / Beliefs Cultural Requests During Hospitalization: None Spiritual Requests During Hospitalization: None Consults Spiritual Care Consult Needed: No Social Work Consult Needed: No Merchant navy officer (For Healthcare) Does patient have an advance directive?: No Would patient like information on creating an advanced directive?: No - patient declined information    Additional Information 1:1 In Past 12 Months?: No CIRT Risk: No Elopement Risk: No Does patient have medical clearance?: No     Disposition:  Disposition Initial Assessment Completed for this Encounter: Yes Disposition of Patient: Other dispositions (sent to ed for medical clearance)  Rollen Sox, MA, LPCA, LCASA Therapeutic Triage Specialist [facility]   11/26/2015 12:44 PM

## 2015-11-26 NOTE — ED Notes (Signed)
Went to room to update patient and he was no longer in the room.  Not found in the bathroom either.

## 2015-11-26 NOTE — ED Notes (Signed)
Reported to Wille CelesteJanie, RN in psych ED and to be brought back after EDP sees patient.

## 2015-11-26 NOTE — ED Notes (Signed)
Pt states he ran out of his alprazolam 3 days ago.  States he hadn't felt well since.  States his girlfriend woke him up and that he was having a seizure.  [facility] but he was sent here for med clearance. C/O N/V/D, sweats, shakes, pain to hips and legs.  In triage pt is pink, warm, dry and not shaking.

## 2015-11-26 NOTE — ED Notes (Signed)
No signs of pt on [facility] campus

## 2015-12-05 ENCOUNTER — Emergency Department (HOSPITAL_BASED_OUTPATIENT_CLINIC_OR_DEPARTMENT_OTHER)
Admission: EM | Admit: 2015-12-05 | Discharge: 2015-12-05 | Disposition: A | Discharge status: Home / Self Care | Payer: Medicare Other | Attending: Emergency Medicine | Admitting: Emergency Medicine

## 2015-12-05 ENCOUNTER — Encounter (HOSPITAL_BASED_OUTPATIENT_CLINIC_OR_DEPARTMENT_OTHER): Payer: Self-pay | Admitting: Emergency Medicine

## 2015-12-05 ENCOUNTER — Emergency Department (HOSPITAL_BASED_OUTPATIENT_CLINIC_OR_DEPARTMENT_OTHER): Payer: Medicare Other

## 2015-12-05 DIAGNOSIS — S40012A Contusion of left shoulder, initial encounter: Secondary | ICD-10-CM | POA: Insufficient documentation

## 2015-12-05 DIAGNOSIS — Z87442 Personal history of urinary calculi: Secondary | ICD-10-CM | POA: Insufficient documentation

## 2015-12-05 DIAGNOSIS — F909 Attention-deficit hyperactivity disorder, unspecified type: Secondary | ICD-10-CM | POA: Insufficient documentation

## 2015-12-05 DIAGNOSIS — Y92410 Unspecified street and highway as the place of occurrence of the external cause: Secondary | ICD-10-CM | POA: Insufficient documentation

## 2015-12-05 DIAGNOSIS — G8929 Other chronic pain: Secondary | ICD-10-CM | POA: Diagnosis not present

## 2015-12-05 DIAGNOSIS — Y9389 Activity, other specified: Secondary | ICD-10-CM | POA: Diagnosis not present

## 2015-12-05 DIAGNOSIS — Z79899 Other long term (current) drug therapy: Secondary | ICD-10-CM | POA: Diagnosis not present

## 2015-12-05 DIAGNOSIS — F419 Anxiety disorder, unspecified: Secondary | ICD-10-CM | POA: Insufficient documentation

## 2015-12-05 DIAGNOSIS — F17210 Nicotine dependence, cigarettes, uncomplicated: Secondary | ICD-10-CM | POA: Insufficient documentation

## 2015-12-05 DIAGNOSIS — Y998 Other external cause status: Secondary | ICD-10-CM | POA: Diagnosis not present

## 2015-12-05 DIAGNOSIS — S3992XA Unspecified injury of lower back, initial encounter: Secondary | ICD-10-CM | POA: Diagnosis not present

## 2015-12-05 DIAGNOSIS — I10 Essential (primary) hypertension: Secondary | ICD-10-CM | POA: Insufficient documentation

## 2015-12-05 DIAGNOSIS — F319 Bipolar disorder, unspecified: Secondary | ICD-10-CM | POA: Insufficient documentation

## 2015-12-05 DIAGNOSIS — S4992XA Unspecified injury of left shoulder and upper arm, initial encounter: Secondary | ICD-10-CM | POA: Diagnosis present

## 2015-12-05 MED ORDER — ACETAMINOPHEN 325 MG PO TABS
650.0000 mg | ORAL_TABLET | ORAL | Status: DC | PRN
  Administered 2015-12-05: 650 mg via ORAL
  Filled 2015-12-05: qty 2

## 2015-12-05 MED ORDER — METHOCARBAMOL 500 MG PO TABS
1000.0000 mg | ORAL_TABLET | Freq: Once | ORAL | Status: AC
  Administered 2015-12-05: 1000 mg via ORAL
  Filled 2015-12-05: qty 2

## 2015-12-05 MED ORDER — METHOCARBAMOL 500 MG PO TABS: 1000.0000 mg | ORAL_TABLET | Freq: Three times a day (TID) | ORAL | Status: AC | PRN
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
## 2015-12-05 NOTE — Discharge Instructions (Signed)

## 2015-12-26 ENCOUNTER — Emergency Department (HOSPITAL_COMMUNITY)
Admission: EM | Admit: 2015-12-26 | Discharge: 2015-12-27 | Payer: Medicare Other | Attending: Emergency Medicine | Admitting: Emergency Medicine

## 2015-12-26 ENCOUNTER — Encounter (HOSPITAL_COMMUNITY): Payer: Self-pay | Admitting: *Deleted

## 2015-12-26 DIAGNOSIS — F1410 Cocaine abuse, uncomplicated: Secondary | ICD-10-CM | POA: Diagnosis not present

## 2015-12-26 DIAGNOSIS — R45851 Suicidal ideations: Secondary | ICD-10-CM | POA: Diagnosis present

## 2015-12-26 DIAGNOSIS — Z79899 Other long term (current) drug therapy: Secondary | ICD-10-CM | POA: Diagnosis not present

## 2015-12-26 DIAGNOSIS — F3130 Bipolar disorder, current episode depressed, mild or moderate severity, unspecified: Secondary | ICD-10-CM | POA: Diagnosis not present

## 2015-12-26 DIAGNOSIS — F419 Anxiety disorder, unspecified: Secondary | ICD-10-CM | POA: Diagnosis not present

## 2015-12-26 DIAGNOSIS — Z87442 Personal history of urinary calculi: Secondary | ICD-10-CM | POA: Insufficient documentation

## 2015-12-26 DIAGNOSIS — G8929 Other chronic pain: Secondary | ICD-10-CM | POA: Diagnosis not present

## 2015-12-26 DIAGNOSIS — F1310 Sedative, hypnotic or anxiolytic abuse, uncomplicated: Secondary | ICD-10-CM | POA: Insufficient documentation

## 2015-12-26 DIAGNOSIS — F17210 Nicotine dependence, cigarettes, uncomplicated: Secondary | ICD-10-CM | POA: Diagnosis not present

## 2015-12-26 DIAGNOSIS — I10 Essential (primary) hypertension: Secondary | ICD-10-CM | POA: Diagnosis not present

## 2015-12-26 LAB — CBC
HCT: 46.5 % (ref 39.0–52.0)
Hemoglobin: 15.8 g/dL (ref 13.0–17.0)
MCH: 34.1 pg — AB (ref 26.0–34.0)
MCHC: 34 g/dL (ref 30.0–36.0)
MCV: 100.2 fL — AB (ref 78.0–100.0)
PLATELETS: 217 10*3/uL (ref 150–400)
RBC: 4.64 MIL/uL (ref 4.22–5.81)
RDW: 13.7 % (ref 11.5–15.5)
WBC: 16.8 10*3/uL — AB (ref 4.0–10.5)

## 2015-12-26 LAB — RAPID URINE DRUG SCREEN, HOSP PERFORMED
Amphetamines: NOT DETECTED
BENZODIAZEPINES: POSITIVE — AB
Barbiturates: NOT DETECTED
Cocaine: POSITIVE — AB
Opiates: NOT DETECTED
Tetrahydrocannabinol: NOT DETECTED

## 2015-12-26 LAB — COMPREHENSIVE METABOLIC PANEL
ALT: 14 U/L — ABNORMAL LOW (ref 17–63)
ANION GAP: 9 (ref 5–15)
AST: 17 U/L (ref 15–41)
Albumin: 3.6 g/dL (ref 3.5–5.0)
Alkaline Phosphatase: 78 U/L (ref 38–126)
BILIRUBIN TOTAL: 0.6 mg/dL (ref 0.3–1.2)
BUN: 16 mg/dL (ref 6–20)
CO2: 25 mmol/L (ref 22–32)
Calcium: 8.6 mg/dL — ABNORMAL LOW (ref 8.9–10.3)
Chloride: 104 mmol/L (ref 101–111)
Creatinine, Ser: 1.19 mg/dL (ref 0.61–1.24)
GFR calc Af Amer: >60 mL/min (ref >60)
Glucose, Bld: 119 mg/dL — ABNORMAL HIGH (ref 65–99)
POTASSIUM: 3.9 mmol/L (ref 3.5–5.1)
Sodium: 138 mmol/L (ref 135–145)
TOTAL PROTEIN: 6.5 g/dL (ref 6.5–8.1)

## 2015-12-26 LAB — ETHANOL

## 2015-12-26 LAB — ACETAMINOPHEN LEVEL: Acetaminophen (Tylenol), Serum: <10 ug/mL — ABNORMAL LOW (ref 10–30)

## 2015-12-26 LAB — SALICYLATE LEVEL: Salicylate Lvl: <4 mg/dL (ref 2.8–30.0)

## 2015-12-26 MED ORDER — ALPRAZOLAM 1 MG PO TABS
2.0000 mg | ORAL_TABLET | Freq: Once | ORAL | Status: AC
  Administered 2015-12-26: 2 mg via ORAL
  Filled 2015-12-26: qty 2
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
## 2015-12-26 NOTE — ED Notes (Signed)
[facility] to overdose. Patient denies HI and AVH at this time. Patient oriented to unit. Plan of care discussed with patient. Patient voices no complaints or concerns at this time. Encouragement and support provided and safety maintain. Q 15 min safety checks in place.

## 2015-12-26 NOTE — Progress Notes (Addendum)
Patient has referred at: [facility] - per Leotis ShamesLauren, fax referral for review. [facility] - per Verlee Monteora, fax referral, d/c in am. [facility] - no answer. [facility] - left voicemail [facility] - per [place], fax referral for the waitlist. [facility] - per French Anaracy, 1 adolescent male and 1 adult male bed.  At [facility]: [facility] - [facility] tonight  Melbourne AbtsCatia Gorje Iyer, [place] Disposition staff 12/26/2015 11:01 PM
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
## 2015-12-27 MED ORDER — ONDANSETRON HCL 4 MG PO TABS: 4.0000 mg | ORAL_TABLET | Freq: Three times a day (TID) | ORAL | Status: DC | PRN

## 2015-12-27 MED ORDER — ALUM & MAG HYDROXIDE-SIMETH 200-200-20 MG/5ML PO SUSP: 30.0000 mL | ORAL | Status: DC | PRN

## 2015-12-27 MED ORDER — ACETAMINOPHEN 325 MG PO TABS
650.0000 mg | ORAL_TABLET | ORAL | Status: DC | PRN
  Administered 2015-12-27: 650 mg via ORAL
  Filled 2015-12-27: qty 2

## 2015-12-27 MED ORDER — NICOTINE 21 MG/24HR TD PT24: 21.0000 mg | MEDICATED_PATCH | Freq: Every day | TRANSDERMAL | Status: DC

## 2015-12-27 MED ORDER — LORAZEPAM 1 MG PO TABS
1.0000 mg | ORAL_TABLET | Freq: Three times a day (TID) | ORAL | Status: DC | PRN
  Administered 2015-12-27 (×2): 1 mg via ORAL
  Filled 2015-12-27 (×2): qty 1

## 2015-12-27 NOTE — ED Notes (Signed)
Patient reports increase anxiety at this time. Respirations equal and unlabored, skin warm and dry. NAD noted. Dr. Frederick Peers informed of patient increased anxiety and new orders received and verified. Encouragement and support provided and safety maintain. Q 15 min safety checks in place.

## 2015-12-27 NOTE — ED Notes (Signed)
Pt presents with increased anxiety. Pt c/o pain, but refusing pain medication. [facility] transport at facility to transfer pt to [facility] per MD order. Pt denies SI/HI. Denies AVH. Personal belongings returned, pt signed for personal belongings. Ambulatory out of facility

## 2015-12-27 NOTE — BH Assessment (Signed)
[facility] Assessment Progress Note   Per Amy at [facility], patient accepted to services of Dr. Betti Cruz.  Can come to [facility] A unit.  Nurse call report to [phone].  Can come after 09:00.  Pt is voluntary.

## 2015-12-27 NOTE — ED Provider Notes (Signed)
CSN: [id number]     Arrival date & time 12/26/15  1731 History   First MD Initiated Contact with Patient 12/26/15 1921     Chief Complaint  Patient presents with  . Suicidal   HPI Jonathan Perez is a 40 y.o. M PMH significant for bipolar disorder, anxiety, HTN, chronic back pain presenting with SI. He cut his wrists on Friday, 12/22/14, was admitted to [facility], received 8 staples.He was discharged from [facility] today, and he feels like they should not have let him go so early, as he is having the same SI thoughts. He has an appointment with [facility] on 1/18, and he does not think he will keep his appointment. He states his plan is to take pills or slit his wrists again.  He feels like he is a burden to everyone. He denies fevers, chills, CP, SOB, abdominal pain, N/V, change in bowel/bladder habits.  Past Medical History  Diagnosis Date  . Chronic back pain   . Kidney stones   . Kidney stones   . ADD (attention deficit disorder)   . Methadone use (HCC)   . Bipolar disorder (HCC)   . Anxiety   . Hypertension   . Sleep apnea    Past Surgical History  Procedure Laterality Date  . Back surgery    . Hand surgery    . Hip surgery    . Back surgery  2005, 2009, 2011  . Other surgical history Left 2001, 2011    thumb  . Other surgical history Right 2002    thumb   Family History  Problem Relation Age of Onset  . Diabetes Other   . Stroke Other   . Diabetes Mother    Social History  Substance Use Topics  . Smoking status: Current Every Day Smoker -- 0.50 packs/day    Types: Cigarettes  . Smokeless tobacco: None  . Alcohol Use: No     Comment: socially    Review of Systems  Ten systems are reviewed and are negative for acute change except as noted in the HPI  Allergies  Reglan and Toradol  Home Medications   Prior to Admission medications   Medication Sig Start Date End Date Taking? Authorizing Provider  ALPRAZolam Prudy Feeler) 2 MG tablet Take 1 tablet (2 mg total) by mouth 4  (four) times daily as needed for anxiety. 10/06/15  Yes Simonne Martinet, NP  Aspirin-Salicylamide-Caffeine (BC HEADACHE POWDER PO) Take 1 each by mouth daily as needed (pain).   Yes Historical Provider, MD  diphenhydrAMINE (BENADRYL) 25 mg capsule Take 25 mg by mouth every 6 (six) hours as needed for sleep.   Yes Historical Provider, MD  DULoxetine (CYMBALTA) 60 MG capsule Take 1 capsule (60 mg total) by mouth daily. 10/26/15  Yes Jolanta B Pucilowska, MD  topiramate (TOPAMAX) 100 MG tablet Take 1 tablet (100 mg total) by mouth at bedtime. 10/27/15  Yes Jolanta B Pucilowska, MD  methocarbamol (ROBAXIN) 500 MG tablet Take 2 tablets (1,000 mg total) by mouth every 8 (eight) hours as needed for muscle spasms. Patient not taking: Reported on 12/26/2015 12/05/15   Loren Racer, MD  traZODone (DESYREL) 100 MG tablet Take 2 tablets (200 mg total) by mouth at bedtime. Patient not taking: Reported on 12/26/2015 10/26/15   Shari Prows, MD   BP 141/87 mmHg  Pulse 74  Temp(Src) 97.6 F (36.4 C) (Oral)  Resp 18  Ht  (1.575 m)  Wt 90.719 kg  BMI 36.57 kg/m2  SpO2 99% Physical Exam  Constitutional: He appears well-developed and well-nourished. No distress.  HENT:  Head: Normocephalic and atraumatic.  Mouth/Throat: Oropharynx is clear and moist. No oropharyngeal exudate.  Eyes: Conjunctivae are normal. Pupils are equal, round, and reactive to light. Right eye exhibits no discharge. Left eye exhibits no discharge. No scleral icterus.  Neck: No tracheal deviation present.  Cardiovascular: Normal rate, regular rhythm, normal heart sounds and intact distal pulses.  Exam reveals no gallop and no friction rub.   No murmur heard. Pulmonary/Chest: Effort normal and breath sounds normal. No respiratory distress. He has no wheezes. He has no rales. He exhibits no tenderness.  Abdominal: Soft. Bowel sounds are normal. He exhibits no distension and no mass. There is no tenderness. There is no rebound  and no guarding.  Musculoskeletal: He exhibits no edema.  Lymphadenopathy:    He has no cervical adenopathy.  Neurological: He is alert. Coordination normal.  Skin: Skin is warm and dry. No rash noted. He is not diaphoretic. No erythema.  Psychiatric:  Anxious and depressed appearing  Nursing note and vitals reviewed.   ED Course  Procedures  Labs Review Labs Reviewed  COMPREHENSIVE METABOLIC PANEL - Abnormal; Notable for the following:    Glucose, Bld 119 (*)    Calcium 8.6 (*)    ALT 14 (*)    All other components within normal limits  ACETAMINOPHEN LEVEL - Abnormal; Notable for the following:    Acetaminophen (Tylenol), Serum <10 (*)    All other components within normal limits  CBC - Abnormal; Notable for the following:    WBC 16.8 (*)    MCV 100.2 (*)    MCH 34.1 (*)    All other components within normal limits  URINE RAPID DRUG SCREEN, HOSP PERFORMED - Abnormal; Notable for the following:    Cocaine POSITIVE (*)    Benzodiazepines POSITIVE (*)    All other components within normal limits  ETHANOL  SALICYLATE LEVEL    MDM   Final diagnoses:  None   Leukocytosis of 16.8. UDS positive for cocaine, benzodiazepine. Otherwise, labs unremarkable. Patient is medically cleared for psych evaluation. TTS evaluated patient and patient meets inpatient criteria. Patient can be safely transferred to psych hold.   After transfer, RN informed me the patient is requesting something for anxiety. He takes 2 mg of xanax four times daily PRN so I will give him his home dose.   Melton Krebs, PA-C 12/27/15 [id number]  Lavera Guise, MD 12/27/15 [phone]

## 2016-01-02 ENCOUNTER — Emergency Department (HOSPITAL_COMMUNITY)
Admission: EM | Admit: 2016-01-02 | Discharge: 2016-01-02 | Disposition: A | Discharge status: Home / Self Care | Payer: Medicare Other | Attending: Emergency Medicine | Admitting: Emergency Medicine

## 2016-01-02 ENCOUNTER — Encounter (HOSPITAL_COMMUNITY): Payer: Self-pay | Admitting: Family Medicine

## 2016-01-02 ENCOUNTER — Emergency Department (HOSPITAL_COMMUNITY): Payer: Medicare Other

## 2016-01-02 DIAGNOSIS — L02414 Cutaneous abscess of left upper limb: Secondary | ICD-10-CM | POA: Insufficient documentation

## 2016-01-02 DIAGNOSIS — Z8669 Personal history of other diseases of the nervous system and sense organs: Secondary | ICD-10-CM | POA: Insufficient documentation

## 2016-01-02 DIAGNOSIS — F419 Anxiety disorder, unspecified: Secondary | ICD-10-CM | POA: Insufficient documentation

## 2016-01-02 DIAGNOSIS — Z79899 Other long term (current) drug therapy: Secondary | ICD-10-CM | POA: Diagnosis not present

## 2016-01-02 DIAGNOSIS — F319 Bipolar disorder, unspecified: Secondary | ICD-10-CM | POA: Diagnosis not present

## 2016-01-02 DIAGNOSIS — L0390 Cellulitis, unspecified: Secondary | ICD-10-CM

## 2016-01-02 DIAGNOSIS — I10 Essential (primary) hypertension: Secondary | ICD-10-CM | POA: Diagnosis not present

## 2016-01-02 DIAGNOSIS — F17210 Nicotine dependence, cigarettes, uncomplicated: Secondary | ICD-10-CM | POA: Diagnosis not present

## 2016-01-02 DIAGNOSIS — Z915 Personal history of self-harm: Secondary | ICD-10-CM | POA: Insufficient documentation

## 2016-01-02 DIAGNOSIS — L03114 Cellulitis of left upper limb: Secondary | ICD-10-CM | POA: Diagnosis not present

## 2016-01-02 DIAGNOSIS — G8929 Other chronic pain: Secondary | ICD-10-CM | POA: Diagnosis not present

## 2016-01-02 DIAGNOSIS — Z87442 Personal history of urinary calculi: Secondary | ICD-10-CM | POA: Insufficient documentation

## 2016-01-02 DIAGNOSIS — L0291 Cutaneous abscess, unspecified: Secondary | ICD-10-CM

## 2016-01-02 DIAGNOSIS — L089 Local infection of the skin and subcutaneous tissue, unspecified: Secondary | ICD-10-CM | POA: Diagnosis present

## 2016-01-02 LAB — COMPREHENSIVE METABOLIC PANEL
ALK PHOS: 90 U/L (ref 38–126)
ALT: 24 U/L (ref 17–63)
AST: 26 U/L (ref 15–41)
Albumin: 3.8 g/dL (ref 3.5–5.0)
Anion gap: 14 (ref 5–15)
BUN: 19 mg/dL (ref 6–20)
CALCIUM: 9.4 mg/dL (ref 8.9–10.3)
CHLORIDE: 107 mmol/L (ref 101–111)
CO2: 21 mmol/L — AB (ref 22–32)
CREATININE: 1.53 mg/dL — AB (ref 0.61–1.24)
GFR, EST NON AFRICAN AMERICAN: 55 mL/min — AB (ref >60)
Glucose, Bld: 146 mg/dL — ABNORMAL HIGH (ref 65–99)
Potassium: 4.7 mmol/L (ref 3.5–5.1)
Sodium: 142 mmol/L (ref 135–145)
Total Bilirubin: 0.5 mg/dL (ref 0.3–1.2)
Total Protein: 7.3 g/dL (ref 6.5–8.1)

## 2016-01-02 LAB — CBC WITH DIFFERENTIAL/PLATELET
BASOS ABS: 0 10*3/uL (ref 0.0–0.1)
Basophils Relative: 0 %
EOS PCT: 0 %
Eosinophils Absolute: 0.1 10*3/uL (ref 0.0–0.7)
HCT: 53.7 % — ABNORMAL HIGH (ref 39.0–52.0)
HEMOGLOBIN: 19 g/dL — AB (ref 13.0–17.0)
LYMPHS ABS: 2.6 10*3/uL (ref 0.7–4.0)
LYMPHS PCT: 15 %
MCH: 34.7 pg — AB (ref 26.0–34.0)
MCHC: 35.4 g/dL (ref 30.0–36.0)
MCV: 98.2 fL (ref 78.0–100.0)
Monocytes Absolute: 1 10*3/uL (ref 0.1–1.0)
Monocytes Relative: 6 %
NEUTROS ABS: 14.1 10*3/uL — AB (ref 1.7–7.7)
NEUTROS PCT: 79 %
PLATELETS: 296 10*3/uL (ref 150–400)
RBC: 5.47 MIL/uL (ref 4.22–5.81)
RDW: 13.7 % (ref 11.5–15.5)
WBC: 17.8 10*3/uL — AB (ref 4.0–10.5)

## 2016-01-02 MED ORDER — CLINDAMYCIN HCL 150 MG PO CAPS: 450.0000 mg | ORAL_CAPSULE | Freq: Three times a day (TID) | ORAL | Status: AC

## 2016-01-02 MED ORDER — MORPHINE SULFATE (PF) 4 MG/ML IV SOLN
4.0000 mg | Freq: Once | INTRAVENOUS | Status: AC
  Administered 2016-01-02: 4 mg via INTRAVENOUS
  Filled 2016-01-02: qty 1

## 2016-01-02 MED ORDER — HYDROCODONE-ACETAMINOPHEN 5-325 MG PO TABS
1.0000 | ORAL_TABLET | Freq: Once | ORAL | Status: AC
  Administered 2016-01-02: 1 via ORAL
  Filled 2016-01-02 (×2): qty 1

## 2016-01-02 MED ORDER — CLINDAMYCIN PHOSPHATE 900 MG/50ML IV SOLN
900.0000 mg | Freq: Once | INTRAVENOUS | Status: AC
  Administered 2016-01-02: 900 mg via INTRAVENOUS
  Filled 2016-01-02: qty 50

## 2016-01-02 MED ORDER — HYDROCODONE-ACETAMINOPHEN 5-325 MG PO TABS: 1.0000 | ORAL_TABLET | ORAL | Status: AC | PRN

## 2016-01-02 MED ORDER — LIDOCAINE HCL 1 % IJ SOLN
10.0000 mL | Freq: Once | INTRAMUSCULAR | Status: AC
  Administered 2016-01-02: 10 mL via INTRADERMAL
  Filled 2016-01-02: qty 10

## 2016-01-02 NOTE — ED Notes (Signed)
Pt denies SI/HI 

## 2016-01-02 NOTE — ED Provider Notes (Signed)
Pt seen and examined. Has some swelling around his right wrist stable line. Staples removed. This ultrasound suggest that contains fluid collection. Discussed with hand surgery by resident. Recommendation is drainage of the subcutaneous fluid collection ER. This performed by resident. Simple sanguinous fluid noted. Wound will be left open and dressed for secondary intention closure. Clindamycin coverage. ER for recheck in 48-72 hours.  Rolland Porter, MD 01/02/16 [phone]

## 2016-01-02 NOTE — Discharge Instructions (Signed)

## 2016-01-02 NOTE — ED Notes (Signed)
Pt here with possible wrist infection to left wrist where staples were placed 10 days ago. Area very red and swollen.

## 2016-01-03 NOTE — ED Provider Notes (Signed)
CSN: [id number]     Arrival date & time 01/02/16  1608 History   First MD Initiated Contact with Patient 01/02/16 1956     Chief Complaint  Patient presents with  . Wrist Problem     (Consider location/radiation/quality/duration/timing/severity/associated sxs/prior Treatment) HPI   68 y M w PMH bipolar disorder who was seen at [facility] for suicide attempt 10 days ago where he used a knife to cut his L volar wrist.  He had staples placed at that time and was discharged to inpatient psych.  Patient has been discharged from inpatient psych and notes his mood is much better, he denies si/hi/hallucinations, but comes in today because of concern for infection of his wrist wound.  He has noticed increased redness/swelling over the past couple of days, no fever.  Past Medical History  Diagnosis Date  . Chronic back pain   . Kidney stones   . Kidney stones   . ADD (attention deficit disorder)   . Methadone use (HCC)   . Bipolar disorder (HCC)   . Anxiety   . Hypertension   . Sleep apnea    Past Surgical History  Procedure Laterality Date  . Back surgery    . Hand surgery    . Hip surgery    . Back surgery  2005, 2009, 2011  . Other surgical history Left 2001, 2011    thumb  . Other surgical history Right 2002    thumb   Family History  Problem Relation Age of Onset  . Diabetes Other   . Stroke Other   . Diabetes Mother    Social History  Substance Use Topics  . Smoking status: Current Every Day Smoker -- 0.50 packs/day    Types: Cigarettes  . Smokeless tobacco: None  . Alcohol Use: No     Comment: socially    Review of Systems  Constitutional: Negative for fever and chills.  Eyes: Negative for redness.  Respiratory: Negative for cough and shortness of breath.   Cardiovascular: Negative for chest pain.  Gastrointestinal: Negative for nausea, vomiting, abdominal pain and diarrhea.  Genitourinary: Negative for dysuria.  Skin: Positive for rash and wound.   Neurological: Negative for headaches.  All other systems reviewed and are negative.     Allergies  Reglan and Toradol  Home Medications   Prior to Admission medications   Medication Sig Start Date End Date Taking? Authorizing Provider  Aspirin-Salicylamide-Caffeine (BC HEADACHE POWDER PO) Take 1 each by mouth daily as needed (pain).    Historical Provider, MD  clindamycin (CLEOCIN) 150 MG capsule Take 3 capsules (450 mg total) by mouth 3 (three) times daily. 01/02/16 01/11/16  Silas Flood, MD  diphenhydrAMINE (BENADRYL) 25 mg capsule Take 25 mg by mouth every 6 (six) hours as needed for sleep.    Historical Provider, MD  DULoxetine (CYMBALTA) 60 MG capsule Take 1 capsule (60 mg total) by mouth daily. 10/26/15   Shari Prows, MD  HYDROcodone-acetaminophen (NORCO/VICODIN) 5-325 MG tablet Take 1 tablet by mouth every 4 (four) hours as needed. 01/02/16   Silas Flood, MD  methocarbamol (ROBAXIN) 500 MG tablet Take 2 tablets (1,000 mg total) by mouth every 8 (eight) hours as needed for muscle spasms. Patient not taking: Reported on 12/26/2015 12/05/15   Loren Racer, MD  traZODone (DESYREL) 100 MG tablet Take 2 tablets (200 mg total) by mouth at bedtime. Patient not taking: Reported on 12/26/2015 10/26/15   Shari Prows, MD   BP 152/93 mmHg  Pulse  77  Temp(Src) 98.1 F (36.7 C) (Oral)  Resp 16  SpO2 96% Physical Exam  Constitutional: He is oriented to person, place, and time. No distress.  HENT:  Head: Normocephalic and atraumatic.  Eyes: EOM are normal. Pupils are equal, round, and reactive to light.  Neck: Normal range of motion. Neck supple.  Cardiovascular: Normal rate.   Pulmonary/Chest: Effort normal. No respiratory distress.  Abdominal: Soft. There is no tenderness.  Musculoskeletal: Normal range of motion.  Neurological: He is alert and oriented to person, place, and time.  Skin: He is not diaphoretic.  There is a 4cm laceration on the volar surface of the L  wrist that is closed with multiple staples.  There is redness and induration of the skin surrounding the wound with mild fluctuance.  There is no crepitus.  The flexor mechanisms are intact at pip/dip of all 5 digits and there is good cap refill in the fingers.  Psychiatric: He has a normal mood and affect.    ED Course  .Marland KitchenIncision and Drainage Date/Time: 01/03/2016 1:03 PM Performed by: Silas Flood Authorized by: Silas Flood Consent: Verbal consent obtained. Risks and benefits: risks, benefits and alternatives were discussed Consent given by: patient Patient identity confirmed: verbally with patient Type: abscess Body area: upper extremity Location details: left wrist Anesthesia: local infiltration Local anesthetic: lidocaine 1% without epinephrine Anesthetic total: 3 ml Risk factor: underlying major vessel and  underlying major nerve Scalpel size: 11 Incision type: single straight Complexity: simple Drainage: serosanguinous and  purulent Drainage amount: scant Wound treatment: wound left open Patient tolerance: Patient tolerated the procedure well with no immediate complications   (including critical care time) Labs Review Labs Reviewed  CBC WITH DIFFERENTIAL/PLATELET - Abnormal; Notable for the following:    WBC 17.8 (*)    Hemoglobin 19.0 (*)    HCT 53.7 (*)    MCH 34.7 (*)    Neutro Abs 14.1 (*)    All other components within normal limits  COMPREHENSIVE METABOLIC PANEL - Abnormal; Notable for the following:    CO2 21 (*)    Glucose, Bld 146 (*)    Creatinine, Ser 1.53 (*)    GFR calc non Af Amer 55 (*)    All other components within normal limits    Imaging Review Dg Wrist Complete Left  01/02/2016  CLINICAL DATA:  Redness on 1 of the anterior wrist EXAM: LEFT WRIST - COMPLETE 3+ VIEW COMPARISON:  None. FINDINGS: There is no evidence of fracture or dislocation. There is no evidence of arthropathy or other focal bone abnormality. Soft tissues demonstrate anterior  swelling. No soft tissue emphysema. IMPRESSION: No osseous abnormality. Anterior soft tissue swelling. Electronically Signed   By: Ted Mcalpine M.D.   On: 01/02/2016 21:27   I have personally reviewed and evaluated these images and lab results as part of my medical decision-making.   EKG Interpretation None      MDM   Final diagnoses:  Cellulitis and abscess    56 y M w PMH bipolar disorder who was seen at [facility] for suicide attempt 10 days ago where he used a knife to cut his L volar wrist.   He presents now with concern for infection.    Exam as above.  The wound appears infected.  I have removed the staples from the wound.  I have spoken with hand surgery on call regarding management given the location of the wound and was advised to incise it and that no specific f/u  w hand surgery was necessary.  I have incised with wound with scan drainage of purulent fluid and a small amount of sanguinous drainage.  After drainage I verified hemostasis and PIP/dip function in all five digits continued to be intact. I feel there is a large component of cellulitis but there doesn't appear to be infection tracking in to the hand, he also has no sig pain with passive extension of the digits to suggest flexor sheath involvement. I have given clinda IV in the ED and will discharge on clinda PO.  I have also given him a few tabs of norco to go home with.  Labs obtained in the ED show leukocytosis but he is afebrile and doesn't appear systemically ill so I feel he is safe for outpatient management at this time  I have discussed the results, Dx and Tx plan with the pt. They expressed understanding and agree with the plan and were told to return to ED with any worsening of condition or concern.    Disposition: Discharge  Condition: Good  Discharge Medication List as of 01/02/2016 11:05 PM    START taking these medications   Details  clindamycin (CLEOCIN) 150 MG capsule Take 3 capsules (450 mg  total) by mouth 3 (three) times daily., Starting 01/02/2016, Until Wed 01/11/16, Print    HYDROcodone-acetaminophen (NORCO/VICODIN) 5-325 MG tablet Take 1 tablet by mouth every 4 (four) hours as needed., Starting 01/02/2016, Until Discontinued, Print        Follow Up: [facility] [address] [place] [address] [phone]  please establish primary care for wound check in two days.  If you can not establish primary care, you may return to the ED for wound check if you feel your wound isn't improving.   Pt seen in conjunction with Dr. Gus Puma, MD 01/03/16 1304  Rolland Porter, MD 01/11/16 [phone]

## 2016-01-16 ENCOUNTER — Emergency Department (HOSPITAL_COMMUNITY): Payer: Medicare Other

## 2016-01-16 ENCOUNTER — Inpatient Hospital Stay (HOSPITAL_COMMUNITY)
Admission: EM | Admit: 2016-01-16 | Discharge: 2016-02-08 | DRG: 917 | Disposition: E | Payer: Medicare Other | Attending: Internal Medicine | Admitting: Internal Medicine

## 2016-01-16 ENCOUNTER — Encounter (HOSPITAL_COMMUNITY): Payer: Self-pay

## 2016-01-16 ENCOUNTER — Inpatient Hospital Stay (HOSPITAL_COMMUNITY): Payer: Medicare Other

## 2016-01-16 DIAGNOSIS — F17210 Nicotine dependence, cigarettes, uncomplicated: Secondary | ICD-10-CM | POA: Diagnosis present

## 2016-01-16 DIAGNOSIS — F1410 Cocaine abuse, uncomplicated: Secondary | ICD-10-CM | POA: Diagnosis present

## 2016-01-16 DIAGNOSIS — G4730 Sleep apnea, unspecified: Secondary | ICD-10-CM | POA: Diagnosis present

## 2016-01-16 DIAGNOSIS — G8929 Other chronic pain: Secondary | ICD-10-CM | POA: Diagnosis present

## 2016-01-16 DIAGNOSIS — J9601 Acute respiratory failure with hypoxia: Secondary | ICD-10-CM | POA: Diagnosis present

## 2016-01-16 DIAGNOSIS — I129 Hypertensive chronic kidney disease with stage 1 through stage 4 chronic kidney disease, or unspecified chronic kidney disease: Secondary | ICD-10-CM | POA: Diagnosis present

## 2016-01-16 DIAGNOSIS — Z915 Personal history of self-harm: Secondary | ICD-10-CM | POA: Diagnosis not present

## 2016-01-16 DIAGNOSIS — Z66 Do not resuscitate: Secondary | ICD-10-CM | POA: Diagnosis not present

## 2016-01-16 DIAGNOSIS — J690 Pneumonitis due to inhalation of food and vomit: Secondary | ICD-10-CM | POA: Diagnosis not present

## 2016-01-16 DIAGNOSIS — I4581 Long QT syndrome: Secondary | ICD-10-CM | POA: Diagnosis present

## 2016-01-16 DIAGNOSIS — E872 Acidosis: Secondary | ICD-10-CM | POA: Diagnosis present

## 2016-01-16 DIAGNOSIS — T6592XA Toxic effect of unspecified substance, intentional self-harm, initial encounter: Secondary | ICD-10-CM | POA: Diagnosis present

## 2016-01-16 DIAGNOSIS — A419 Sepsis, unspecified organism: Secondary | ICD-10-CM | POA: Diagnosis not present

## 2016-01-16 DIAGNOSIS — I469 Cardiac arrest, cause unspecified: Secondary | ICD-10-CM | POA: Diagnosis present

## 2016-01-16 DIAGNOSIS — R402212 Coma scale, best verbal response, none, at arrival to emergency department: Secondary | ICD-10-CM | POA: Diagnosis present

## 2016-01-16 DIAGNOSIS — Z79899 Other long term (current) drug therapy: Secondary | ICD-10-CM | POA: Diagnosis not present

## 2016-01-16 DIAGNOSIS — G9340 Encephalopathy, unspecified: Secondary | ICD-10-CM | POA: Insufficient documentation

## 2016-01-16 DIAGNOSIS — Z452 Encounter for adjustment and management of vascular access device: Secondary | ICD-10-CM | POA: Diagnosis not present

## 2016-01-16 DIAGNOSIS — N189 Chronic kidney disease, unspecified: Secondary | ICD-10-CM | POA: Diagnosis present

## 2016-01-16 DIAGNOSIS — R402112 Coma scale, eyes open, never, at arrival to emergency department: Secondary | ICD-10-CM | POA: Diagnosis present

## 2016-01-16 DIAGNOSIS — E876 Hypokalemia: Secondary | ICD-10-CM | POA: Diagnosis present

## 2016-01-16 DIAGNOSIS — Z515 Encounter for palliative care: Secondary | ICD-10-CM | POA: Diagnosis not present

## 2016-01-16 DIAGNOSIS — F319 Bipolar disorder, unspecified: Secondary | ICD-10-CM | POA: Diagnosis present

## 2016-01-16 DIAGNOSIS — J9600 Acute respiratory failure, unspecified whether with hypoxia or hypercapnia: Secondary | ICD-10-CM | POA: Insufficient documentation

## 2016-01-16 DIAGNOSIS — R6521 Severe sepsis with septic shock: Secondary | ICD-10-CM | POA: Diagnosis not present

## 2016-01-16 DIAGNOSIS — N179 Acute kidney failure, unspecified: Secondary | ICD-10-CM | POA: Diagnosis present

## 2016-01-16 DIAGNOSIS — R402312 Coma scale, best motor response, none, at arrival to emergency department: Secondary | ICD-10-CM | POA: Diagnosis present

## 2016-01-16 DIAGNOSIS — R570 Cardiogenic shock: Secondary | ICD-10-CM | POA: Diagnosis present

## 2016-01-16 DIAGNOSIS — G931 Anoxic brain damage, not elsewhere classified: Secondary | ICD-10-CM | POA: Diagnosis present

## 2016-01-16 DIAGNOSIS — Z6832 Body mass index (BMI) 32.0-32.9, adult: Secondary | ICD-10-CM | POA: Diagnosis not present

## 2016-01-16 DIAGNOSIS — J9690 Respiratory failure, unspecified, unspecified whether with hypoxia or hypercapnia: Secondary | ICD-10-CM

## 2016-01-16 LAB — I-STAT CHEM 8, ED
BUN: 17 mg/dL (ref 6–20)
CALCIUM ION: 1.07 mmol/L — AB (ref 1.12–1.23)
CHLORIDE: 103 mmol/L (ref 101–111)
CREATININE: 1.7 mg/dL — AB (ref 0.61–1.24)
Glucose, Bld: 244 mg/dL — ABNORMAL HIGH (ref 65–99)
HCT: 50 % (ref 39.0–52.0)
Hemoglobin: 17 g/dL (ref 13.0–17.0)
Potassium: 3.3 mmol/L — ABNORMAL LOW (ref 3.5–5.1)
SODIUM: 140 mmol/L (ref 135–145)
TCO2: 18 mmol/L (ref 0–100)

## 2016-01-16 LAB — COMPREHENSIVE METABOLIC PANEL
ALBUMIN: 3.2 g/dL — AB (ref 3.5–5.0)
ALT: 126 U/L — ABNORMAL HIGH (ref 17–63)
AST: 133 U/L — AB (ref 15–41)
Alkaline Phosphatase: 95 U/L (ref 38–126)
Anion gap: 25 — ABNORMAL HIGH (ref 5–15)
BUN: 12 mg/dL (ref 6–20)
CALCIUM: 8.3 mg/dL — AB (ref 8.9–10.3)
CHLORIDE: 97 mmol/L — AB (ref 101–111)
CO2: 16 mmol/L — AB (ref 22–32)
CREATININE: 2.09 mg/dL — AB (ref 0.61–1.24)
GFR calc Af Amer: 44 mL/min — ABNORMAL LOW (ref >60)
GFR calc non Af Amer: 38 mL/min — ABNORMAL LOW (ref >60)
Glucose, Bld: 263 mg/dL — ABNORMAL HIGH (ref 65–99)
Potassium: 3.3 mmol/L — ABNORMAL LOW (ref 3.5–5.1)
SODIUM: 138 mmol/L (ref 135–145)
Total Bilirubin: 0.4 mg/dL (ref 0.3–1.2)
Total Protein: 5.8 g/dL — ABNORMAL LOW (ref 6.5–8.1)

## 2016-01-16 LAB — CBC WITH DIFFERENTIAL/PLATELET
BASOS ABS: 0.2 10*3/uL — AB (ref 0.0–0.1)
Basophils Relative: 1 %
EOS ABS: 0.2 10*3/uL (ref 0.0–0.7)
Eosinophils Relative: 1 %
HCT: 47.3 % (ref 39.0–52.0)
Hemoglobin: 14.8 g/dL (ref 13.0–17.0)
LYMPHS ABS: 6.3 10*3/uL — AB (ref 0.7–4.0)
LYMPHS PCT: 35 %
MCH: 33 pg (ref 26.0–34.0)
MCHC: 31.3 g/dL (ref 30.0–36.0)
MCV: 105.3 fL — ABNORMAL HIGH (ref 78.0–100.0)
MONOS PCT: 7 %
Monocytes Absolute: 1.3 10*3/uL — ABNORMAL HIGH (ref 0.1–1.0)
NEUTROS ABS: 9.9 10*3/uL — AB (ref 1.7–7.7)
Neutrophils Relative %: 56 %
Platelets: 258 10*3/uL (ref 150–400)
RBC: 4.49 MIL/uL (ref 4.22–5.81)
RDW: 13.9 % (ref 11.5–15.5)
WBC: 17.9 10*3/uL — AB (ref 4.0–10.5)

## 2016-01-16 LAB — URINALYSIS, ROUTINE W REFLEX MICROSCOPIC
BILIRUBIN URINE: NEGATIVE
GLUCOSE, UA: NEGATIVE mg/dL
Ketones, ur: NEGATIVE mg/dL
NITRITE: NEGATIVE
PH: 6 (ref 5.0–8.0)
Protein, ur: 100 mg/dL — AB
SPECIFIC GRAVITY, URINE: 1.017 (ref 1.005–1.030)

## 2016-01-16 LAB — URINE MICROSCOPIC-ADD ON

## 2016-01-16 LAB — I-STAT TROPONIN, ED: Troponin i, poc: 0 ng/mL (ref 0.00–0.08)

## 2016-01-16 LAB — I-STAT CG4 LACTIC ACID, ED: Lactic Acid, Venous: 13.61 mmol/L (ref 0.5–2.0)

## 2016-01-16 MED ORDER — ASPIRIN 325 MG PO TABS
325.0000 mg | ORAL_TABLET | Freq: Once | ORAL | Status: AC
  Administered 2016-01-17: 325 mg
  Filled 2016-01-16: qty 1

## 2016-01-16 MED ORDER — NOREPINEPHRINE BITARTRATE 1 MG/ML IV SOLN: 0.0000 ug/min | INTRAVENOUS | Status: DC

## 2016-01-16 MED ORDER — PROPOFOL 1000 MG/100ML IV EMUL
5.0000 ug/kg/min | INTRAVENOUS | Status: DC
  Administered 2016-01-16: 30 ug/kg/min via INTRAVENOUS
  Administered 2016-01-17: 50 ug/kg/min via INTRAVENOUS
  Administered 2016-01-17: 50.251 ug/kg/min via INTRAVENOUS
  Administered 2016-01-17 (×4): 50 ug/kg/min via INTRAVENOUS
  Administered 2016-01-18: 25 ug/kg/min via INTRAVENOUS
  Administered 2016-01-18 (×3): 50 ug/kg/min via INTRAVENOUS
  Filled 2016-01-16 (×14): qty 100

## 2016-01-16 MED ORDER — FENTANYL CITRATE (PF) 100 MCG/2ML IJ SOLN
100.0000 ug | Freq: Once | INTRAMUSCULAR | Status: AC
  Administered 2016-01-16: 200 ug via INTRAVENOUS

## 2016-01-16 MED ORDER — ASPIRIN 300 MG RE SUPP: 300.0000 mg | RECTAL | Status: DC

## 2016-01-16 MED ORDER — FENTANYL BOLUS VIA INFUSION
50.0000 ug | INTRAVENOUS | Status: DC | PRN
  Filled 2016-01-16: qty 50

## 2016-01-16 MED ORDER — CISATRACURIUM BOLUS VIA INFUSION
0.0500 mg/kg | INTRAVENOUS | Status: DC | PRN
  Filled 2016-01-16: qty 5

## 2016-01-16 MED ORDER — ALBUTEROL SULFATE (2.5 MG/3ML) 0.083% IN NEBU: 2.5000 mg | INHALATION_SOLUTION | RESPIRATORY_TRACT | Status: DC | PRN

## 2016-01-16 MED ORDER — MIDAZOLAM HCL 2 MG/2ML IJ SOLN
INTRAMUSCULAR | Status: AC
  Filled 2016-01-16: qty 8

## 2016-01-16 MED ORDER — HEPARIN SODIUM (PORCINE) 5000 UNIT/ML IJ SOLN
5000.0000 [IU] | Freq: Three times a day (TID) | INTRAMUSCULAR | Status: DC
  Administered 2016-01-16 – 2016-01-20 (×11): 5000 [IU] via SUBCUTANEOUS
  Filled 2016-01-16 (×12): qty 1

## 2016-01-16 MED ORDER — FENTANYL CITRATE (PF) 100 MCG/2ML IJ SOLN
INTRAMUSCULAR | Status: AC
  Filled 2016-01-16: qty 4

## 2016-01-16 MED ORDER — SODIUM CHLORIDE 0.9 % IV SOLN
1.0000 mg/h | INTRAVENOUS | Status: DC
  Administered 2016-01-16: 5 mg/h via INTRAVENOUS
  Filled 2016-01-16: qty 10

## 2016-01-16 MED ORDER — CALCIUM GLUCONATE 10 % IV SOLN
1.0000 g | Freq: Once | INTRAVENOUS | Status: AC
Start: 2016-01-16 — End: 2016-01-17
  Administered 2016-01-17: 1 g via INTRAVENOUS
  Filled 2016-01-16: qty 10

## 2016-01-16 MED ORDER — SODIUM CHLORIDE 0.9 % IV SOLN
2000.0000 mL | Freq: Once | INTRAVENOUS | Status: AC
  Administered 2016-01-16: 1000 mL via INTRAVENOUS

## 2016-01-16 MED ORDER — POTASSIUM CHLORIDE 20 MEQ/15ML (10%) PO SOLN
40.0000 meq | Freq: Once | ORAL | Status: AC
  Administered 2016-01-16: 40 meq
  Filled 2016-01-16: qty 30

## 2016-01-16 MED ORDER — SODIUM CHLORIDE 0.9 % IV SOLN: INTRAVENOUS | Status: DC

## 2016-01-16 MED ORDER — INSULIN ASPART 100 UNIT/ML ~~LOC~~ SOLN
2.0000 [IU] | SUBCUTANEOUS | Status: DC
  Administered 2016-01-17: 4 [IU] via SUBCUTANEOUS

## 2016-01-16 MED ORDER — SODIUM CHLORIDE 0.9 % IV SOLN
INTRAVENOUS | Status: DC
  Administered 2016-01-19: 15:00:00 via INTRAVENOUS

## 2016-01-16 MED ORDER — SODIUM CHLORIDE 0.9 % IV BOLUS (SEPSIS)
1000.0000 mL | Freq: Once | INTRAVENOUS | Status: AC
  Administered 2016-01-16: 1000 mL via INTRAVENOUS

## 2016-01-16 MED ORDER — SODIUM CHLORIDE 0.9 % IV SOLN
25.0000 ug/h | INTRAVENOUS | Status: DC
  Administered 2016-01-16: 100 ug/h via INTRAVENOUS
  Filled 2016-01-16: qty 50

## 2016-01-16 MED ORDER — SODIUM CHLORIDE 0.9 % IV SOLN
1.0000 ug/kg/min | INTRAVENOUS | Status: DC
  Administered 2016-01-16: 1 ug/kg/min via INTRAVENOUS
  Filled 2016-01-16: qty 20

## 2016-01-16 MED ORDER — MIDAZOLAM HCL 2 MG/2ML IJ SOLN
2.0000 mg | Freq: Once | INTRAMUSCULAR | Status: AC
Start: 2016-01-16 — End: 2016-01-16
  Administered 2016-01-16: 4 mg via INTRAVENOUS

## 2016-01-16 MED ORDER — MIDAZOLAM BOLUS VIA INFUSION
2.0000 mg | INTRAVENOUS | Status: DC | PRN
  Filled 2016-01-16: qty 2

## 2016-01-16 MED ORDER — NOREPINEPHRINE BITARTRATE 1 MG/ML IV SOLN
0.0000 ug/min | INTRAVENOUS | Status: DC
  Administered 2016-01-16: 10 ug/min via INTRAVENOUS
  Administered 2016-01-17 – 2016-01-18 (×2): 2 ug/min via INTRAVENOUS
  Filled 2016-01-16 (×3): qty 4

## 2016-01-16 MED ORDER — SODIUM CHLORIDE 0.9 % IV SOLN
INTRAVENOUS | Status: AC | PRN
  Administered 2016-01-16: 1000 mL via INTRAVENOUS

## 2016-01-16 MED ORDER — ARTIFICIAL TEARS OP OINT
1.0000 "application " | TOPICAL_OINTMENT | Freq: Three times a day (TID) | OPHTHALMIC | Status: DC
  Administered 2016-01-16 – 2016-01-20 (×11): 1 via OPHTHALMIC
  Filled 2016-01-16 (×2): qty 3.5

## 2016-01-16 MED ORDER — HYDRALAZINE HCL 20 MG/ML IJ SOLN
10.0000 mg | INTRAMUSCULAR | Status: DC | PRN
  Filled 2016-01-16: qty 1

## 2016-01-16 MED ORDER — CISATRACURIUM BOLUS VIA INFUSION
0.1000 mg/kg | Freq: Once | INTRAVENOUS | Status: DC
  Administered 2016-01-16: 9.1 mg via INTRAVENOUS
  Filled 2016-01-16: qty 10

## 2016-01-16 MED ORDER — FAMOTIDINE IN NACL 20-0.9 MG/50ML-% IV SOLN
20.0000 mg | Freq: Two times a day (BID) | INTRAVENOUS | Status: DC
  Administered 2016-01-16 – 2016-01-20 (×8): 20 mg via INTRAVENOUS
  Filled 2016-01-16 (×8): qty 50

## 2016-01-16 NOTE — Consult Note (Signed)
Cardiology Consult    Patient ID: Jonathan Perez MRN: [id number], DOB/AGE: December 17, 1975   Admit date: 01/23/2016 Date of Consult: 02/07/2016  Primary Physician: No PCP Per Patient Primary Cardiologist: None Requesting Provider: ER  Patient Profile    [name] w/ a PMH significant for bipolar d/o and alcohol/drug abuse presenting s/p witnessed cardiac arrest.  Past Medical History   Past Medical History  Diagnosis Date  . Chronic back pain   . Kidney stones   . Kidney stones   . ADD (attention deficit disorder)   . Methadone use (HCC)   . Bipolar disorder (HCC)   . Anxiety   . Hypertension   . Sleep apnea     Past Surgical History  Procedure Laterality Date  . Back surgery    . Hand surgery    . Hip surgery    . Back surgery  2005, 2009, 2011  . Other surgical history Left 2001, 2011    thumb  . Other surgical history Right 2002    thumb    Allergies  Allergies  Allergen Reactions  . Reglan [Metoclopramide] Itching    Agitation   . Toradol [Ketorolac Tromethamine] Itching and Rash   History of Present Illness    Briefly, pt has longstanding psych hx and has tried to kill himself multiple times. Previously admitted for hypoxemic/hypercarbic respiratory failure 2/2 drug overdose. Today checked into hotel and shortly thereafter was seen to collapse. Not accompanied by anyone else. EMS called and initial rhythm asystole. Received CPR for >20 minutes and ~5 rounds of epi. On arrival to ER was intubated and started on NE. Initial EKG showed minimal STE in aVR and diffuse ST depression/T wave inversions in all leads. Vitals stable and oxygenating well at the time of my PEX. Repeat EKG showed minimal upsloping ST depressions in inferolateral leads. Bedside TTE w/ limited windows but in subcostal view LVEF appears normal-hyperdynamic.  Inpatient Medications    . artificial tears  1 application Both Eyes 3 times per day  . aspirin  300 mg Rectal NOW  . cisatracurium   0.1 mg/kg Intravenous Once  . fentaNYL (SUBLIMAZE) injection  100 mcg Intravenous Once  . heparin  5,000 Units Subcutaneous 3 times per day  . midazolam  2 mg Intravenous Once  . potassium chloride  40 mEq Per Tube Once   Family History    Family History  Problem Relation Age of Onset  . Diabetes Other   . Stroke Other   . Diabetes Mother    Social History    Social History   Social History  . Marital Status: Single    Spouse Name: N/A  . Number of Children: N/A  . Years of Education: N/A   Occupational History  . Not on file.   Social History Main Topics  . Smoking status: Current Every Day Smoker -- 0.50 packs/day    Types: Cigarettes  . Smokeless tobacco: Not on file  . Alcohol Use: No     Comment: socially  . Drug Use: No     Comment: no longer uses  . Sexual Activity: Yes    Birth Control/ Protection: None   Other Topics Concern  . Not on file   Social History Narrative   ** Merged History Encounter **        Review of Systems    Unable to obtain due to intubation and sedation  Physical Exam    Blood pressure 178/115, pulse  109, temperature 97.1 F (36.2 C), temperature source Temporal, resp. rate 15, SpO2 99 %.  General: Intubated and sedated Psych: Deferred Neuro: Does not withdrawal extremities to pain. HEENT: Normal  Neck: Supple without bruits or JVD. Lungs:  Resp regular and unlabored, CTA. Heart: tachy regular no s3, s4, or murmurs. Abdomen: Soft, non-tender, non-distended, BS + x 4.  Extremities: No clubbing, cyanosis or edema. DP/PT/Radials 2+ and equal bilaterally.  Labs    Troponin [facility] of Care Test)  Recent Labs  01/27/2016 1958  TROPIPOC 0.00   No results for input(s): CKTOTAL, CKMB, TROPONINI in the last 72 hours. Lab Results  Component Value Date   WBC 17.9* 01/15/2016   HGB 17.0 01/31/2016   HCT 50.0 01/24/2016   MCV 105.3* 02/04/2016   PLT 258 01/25/2016    Recent Labs Lab 01/24/2016 1950 01/27/2016 2030  NA 138  140  K 3.3* 3.3*  CL 97* 103  CO2 16*  --   BUN 12 17  CREATININE 2.09* 1.70*  CALCIUM 8.3*  --   PROT 5.8*  --   BILITOT 0.4  --   ALKPHOS 95  --   ALT 126*  --   AST 133*  --   GLUCOSE 263* 244*   No results found for: CHOL, HDL, LDLCALC, TRIG Lab Results  Component Value Date   DDIMER * 03/03/2011    0.70        AT THE INHOUSE ESTABLISHED CUTOFF VALUE OF 0.48 ug/mL FEU, THIS ASSAY HAS BEEN DOCUMENTED IN THE LITERATURE TO HAVE A SENSITIVITY AND NEGATIVE PREDICTIVE VALUE OF AT LEAST 98 TO 99%.  THE TEST RESULT SHOULD BE CORRELATED WITH AN ASSESSMENT OF THE CLINICAL PROBABILITY OF DVT / VTE.   Radiology Studies    Dg Wrist Complete Left  01/02/2016  CLINICAL DATA:  Redness on 1 of the anterior wrist EXAM: LEFT WRIST - COMPLETE 3+ VIEW COMPARISON:  None. FINDINGS: There is no evidence of fracture or dislocation. There is no evidence of arthropathy or other focal bone abnormality. Soft tissues demonstrate anterior swelling. No soft tissue emphysema. IMPRESSION: No osseous abnormality. Anterior soft tissue swelling. Electronically Signed   By: Ted Mcalpine M.D.   On: 01/02/2016 21:27   Dg Chest Port 1 View  01/30/2016  CLINICAL DATA:  Endotracheal tube and orogastric tube placement EXAM: PORTABLE CHEST 1 VIEW COMPARISON:  12/05/2015 FINDINGS: Lines and tubes partially obscured by spinal rods. Endotracheal tube tip is about 2.5 cm above the carina. Orogastric tube crosses the gastroesophageal junction and loops within the stomach. The stomach is gaseous lead distended. Vascular congestion is present as well as mild cardiac enlargement. IMPRESSION: Lines and tubes as described. Electronically Signed   By: Esperanza Heir M.D.   On: 01/23/2016 20:20   ECG & Cardiac Imaging    EKG - initially ST elevation in aVR and diffuse ST depression, repeat improved  Bedside TTE - normal-hyperdynamic EF, further assessment deferred   Assessment & Plan  [name] w/ a PMH  significant for bipolar d/o and alcohol/drug abuse presenting s/p witnessed cardiac arrest.  #s/p Cardiac Arrest: Likely pulmonary 2/2 drug overdose. Initial rhythm asystole. Prolonged down time and lactic acid 13. Initial EKG concerning for global ischemia but 2nd much improved. Based on age, hx, and EKG this is less likely to be a primary cardiac arrest (i.e. VT/VF) and more likely primary pulmonary 2/2 drug overdose. Would admit to ICU, trend EKG/CE, and obtain formal echo in AM. -  admit to ICU -trend EKG/CE -agree with therapeutic hypothermia if no contraindication -formal TTE in AM -defer urgent/emergent LHC  Signed, Vista Mink, MD 02/03/2016, 8:57 PM

## 2016-01-16 NOTE — Progress Notes (Signed)
Patient transferred from ED to 2H without any complications. RT will continue to monitor. 

## 2016-01-16 NOTE — ED Notes (Signed)
2nd cold saline hung

## 2016-01-16 NOTE — ED Provider Notes (Signed)
CSN: [id number]     Arrival date & time 02/03/2016  1942 History   None    Chief Complaint  Patient presents with  . Cardiac Arrest   Level V caveat secondary to cardiac arrest  (Consider location/radiation/quality/duration/timing/severity/associated sxs/prior Treatment) The history is provided by the EMS personnel. The history is limited by the condition of the patient.   patient is a 40 year old male who presents via EMS status post cardiac arrest. It is reported that he checked into a hotel by himself and soon later was found slumped against the wall. Initial rhythm by EMS of asystole. CPR was administered. Narcan was given without improvement. King airway placed. Patient received a total of 20 minutes versus a dictation by EMS. PEA was also observed. IO was placed on the field.  Past Medical History  Diagnosis Date  . Chronic back pain   . Kidney stones   . Kidney stones   . ADD (attention deficit disorder)   . Methadone use (HCC)   . Bipolar disorder (HCC)   . Anxiety   . Hypertension   . Sleep apnea    Past Surgical History  Procedure Laterality Date  . Back surgery    . Hand surgery    . Hip surgery    . Back surgery  2005, 2009, 2011  . Other surgical history Left 2001, 2011    thumb  . Other surgical history Right 2002    thumb   Family History  Problem Relation Age of Onset  . Diabetes Other   . Stroke Other   . Diabetes Mother    Social History  Substance Use Topics  . Smoking status: Current Every Day Smoker -- 0.50 packs/day    Types: Cigarettes  . Smokeless tobacco: None  . Alcohol Use: No     Comment: socially    Review of Systems  Unable to perform ROS: Acuity of condition      Allergies  Reglan and Toradol  Home Medications   Prior to Admission medications   Medication Sig Start Date End Date Taking? Authorizing Provider  Aspirin-Salicylamide-Caffeine (BC HEADACHE POWDER PO) Take 1 each by mouth daily as needed (pain).    Historical  Provider, MD  diphenhydrAMINE (BENADRYL) 25 mg capsule Take 25 mg by mouth every 6 (six) hours as needed for sleep.    Historical Provider, MD  DULoxetine (CYMBALTA) 60 MG capsule Take 1 capsule (60 mg total) by mouth daily. 10/26/15   Shari Prows, MD  HYDROcodone-acetaminophen (NORCO/VICODIN) 5-325 MG tablet Take 1 tablet by mouth every 4 (four) hours as needed. 01/02/16   Silas Flood, MD  methocarbamol (ROBAXIN) 500 MG tablet Take 2 tablets (1,000 mg total) by mouth every 8 (eight) hours as needed for muscle spasms. Patient not taking: Reported on 12/26/2015 12/05/15   Loren Racer, MD  traZODone (DESYREL) 100 MG tablet Take 2 tablets (200 mg total) by mouth at bedtime. Patient not taking: Reported on 12/26/2015 10/26/15   Jolanta B Pucilowska, MD   BP 186/140 mmHg  Pulse 61  Temp(Src) 96.1 F (35.6 C) (Core (Comment))  Resp 9  Ht 5\' 8"  (1.727 m)  Wt 99.5 kg  BMI 33.36 kg/m2  SpO2 98% Physical Exam  Constitutional: He appears well-developed and well-nourished.  HENT:  Head: Normocephalic and atraumatic.  Eyes:  Pupils equal and dilated. Unresponsive to light  Neck: No JVD present.  Cardiovascular: Normal rate, normal heart sounds and intact distal pulses.   Pulmonary/Chest:  Mechanically ventilated  Abdominal:  Soft. He exhibits no distension.  Musculoskeletal: He exhibits no edema.  Neurological: He is unresponsive. He displays no seizure activity. GCS eye subscore is 1. GCS verbal subscore is 1. GCS motor subscore is 1.  Skin: No rash noted. No erythema.    ED Course  .Intubation Date/Time: 01/17/2016 12:11 AM Performed by: Marijean Niemann Authorized by: Marijean Niemann Consent: The procedure was performed in an emergent situation. Verbal consent not obtained. Written consent not obtained. Patient identity confirmed: anonymous protocol, patient vented/unresponsive Indications: airway protection and  hypoxemia Intubation method: video-assisted Patient status:  unconscious Preoxygenation: BVM Laryngoscope size: Mac 4 Tube size: 7.5 mm Tube type: cuffed Number of attempts: 1 Cords visualized: yes Post-procedure assessment: chest rise,  ETCO2 monitor and CO2 detector Breath sounds: equal Cuff inflated: yes Tube secured with: ETT holder Chest x-ray interpreted by me. Chest x-ray findings: endotracheal tube in appropriate position Patient tolerance: Patient tolerated the procedure well with no immediate complications      EMERGENCY DEPARTMENT [place] CARDIAC EXAM "Study: Limited Ultrasound of the heart and pericardium"  INDICATIONS:Hypotension and Cardiac arrest Multiple views of the heart and pericardium were obtained in real-time with a multi-frequency probe.  PERFORMED ZO:XWRUEA  IMAGES ARCHIVED?: No  FINDINGS: No pericardial effusion  LIMITATIONS:  Emergent procedure  VIEWS USED: Subcostal 4 chamber  INTERPRETATION: Cardiac activity present and Pericardial effusioin absent     (including critical care time) Labs Review Labs Reviewed  CBC WITH DIFFERENTIAL/PLATELET - Abnormal; Notable for the following:    WBC 17.9 (*)    MCV 105.3 (*)    Neutro Abs 9.9 (*)    Lymphs Abs 6.3 (*)    Monocytes Absolute 1.3 (*)    Basophils Absolute 0.2 (*)    All other components within normal limits  COMPREHENSIVE METABOLIC PANEL - Abnormal; Notable for the following:    Potassium 3.3 (*)    Chloride 97 (*)    CO2 16 (*)    Glucose, Bld 263 (*)    Creatinine, Ser 2.09 (*)    Calcium 8.3 (*)    Total Protein 5.8 (*)    Albumin 3.2 (*)    AST 133 (*)    ALT 126 (*)    GFR calc non Af Amer 38 (*)    GFR calc Af Amer 44 (*)    Anion gap 25 (*)    All other components within normal limits  URINALYSIS, ROUTINE W REFLEX MICROSCOPIC (NOT AT [facility]) - Abnormal; Notable for the following:    APPearance CLOUDY (*)    Hgb urine dipstick LARGE (*)    Protein, ur 100 (*)    Leukocytes, UA SMALL (*)    All other components within normal limits   URINE MICROSCOPIC-ADD ON - Abnormal; Notable for the following:    Squamous Epithelial / LPF 0-5 (*)    Bacteria, UA MANY (*)    All other components within normal limits  GLUCOSE, CAPILLARY - Abnormal; Notable for the following:    Glucose-Capillary 214 (*)    All other components within normal limits  I-STAT CG4 LACTIC ACID, ED - Abnormal; Notable for the following:    Lactic Acid, Venous 13.61 (*)    All other components within normal limits  I-STAT CHEM 8, ED - Abnormal; Notable for the following:    Potassium 3.3 (*)    Creatinine, Ser 1.70 (*)    Glucose, Bld 244 (*)    Calcium, Ion 1.07 (*)    All other components within normal  limits  APTT  PROTIME-INR  MAGNESIUM  ACETAMINOPHEN LEVEL  SALICYLATE LEVEL  BLOOD GAS, ARTERIAL  URINE RAPID DRUG SCREEN, HOSP PERFORMED  OSMOLALITY  TROPONIN I  TROPONIN I  TROPONIN I  BASIC METABOLIC PANEL  BASIC METABOLIC PANEL  BASIC METABOLIC PANEL  BASIC METABOLIC PANEL  PROTIME-INR  PROTIME-INR  APTT  APTT  BLOOD GAS, ARTERIAL  BLOOD GAS, ARTERIAL  CBC  BASIC METABOLIC PANEL  MAGNESIUM  BLOOD GAS, ARTERIAL  PHOSPHORUS  LACTIC ACID, PLASMA  LACTIC ACID, PLASMA  LACTIC ACID, PLASMA  PROCALCITONIN  PROCALCITONIN  TROPONIN I  I-STAT TROPOININ, ED  I-STAT TROPOININ, ED    Imaging Review Ct Head Wo Contrast  01/31/2016  CLINICAL DATA:  40 year old male with cardiac arrest EXAM: CT HEAD WITHOUT CONTRAST TECHNIQUE: Contiguous axial images were obtained from the base of the skull through the vertex without intravenous contrast. COMPARISON:  Head CT dated 03/09/2014 FINDINGS: There is mild diffuse hypoattenuation of the brain parenchyma with a degree of indistinctness of the great white matter differentiation concerning for underlying edema. There is also partial effacement of sulci when compared to the prior study. No intracranial hemorrhage identified. There is no midline shift. There is hypoattenuation of the anterior and middle  cerebral arteries bilaterally which may represent hemoconcentration or be related to a background of parenchymal edema. There is diffuse mucoperiosteal thickening and partial opacification of paranasal sinuses. The mastoid air cells all clear. The calvarium is intact. An endotracheal and enteric tubes are partially visualized. IMPRESSION: Diffuse hypoattenuation of the brain parenchymal with a degree of loss of gray-white matter discrimination concerning for edema and suggestive of a degree of hypoxic injury. Clinical correlation and close follow-up recommended. MRI may provide better evaluation. No acute intracranial hemorrhage or midline shift. These results were called by telephone at the time of interpretation on 01/12/2016 at 9:44 pm to Dr. Silverio Lay, who verbally acknowledged these results. Electronically Signed   By: Elgie Collard M.D.   On: 01/29/2016 21:44   Dg Chest Port 1 View  01/26/2016  CLINICAL DATA:  Central line placement.  Initial encounter. EXAM: PORTABLE CHEST 1 VIEW COMPARISON:  Chest radiograph performed earlier today at 8:04 p.m. FINDINGS: The patient's endotracheal tube is seen ending 2-3 cm above the carina. A right IJ line is noted extending across the midline, ending about the left brachiocephalic vein. An enteric tube is noted extending below the diaphragm. Vascular congestion is noted, with mild bilateral atelectasis. No definite pleural effusion or pneumothorax is seen. The cardiomediastinal silhouette is borderline normal in size. No acute osseous abnormalities are identified. IMPRESSION: 1. Right IJ line noted extending across the midline, ending about the left brachiocephalic vein. 2. Endotracheal tube seen ending 2-3 cm above the carina. 3. Vascular congestion, with mild bilateral atelectasis. Electronically Signed   By: Roanna Raider M.D.   On: 01/15/2016 23:24   Dg Chest Port 1 View  01/22/2016  CLINICAL DATA:  Endotracheal tube and orogastric tube placement EXAM: PORTABLE CHEST 1  VIEW COMPARISON:  12/05/2015 FINDINGS: Lines and tubes partially obscured by spinal rods. Endotracheal tube tip is about 2.5 cm above the carina. Orogastric tube crosses the gastroesophageal junction and loops within the stomach. The stomach is gaseous lead distended. Vascular congestion is present as well as mild cardiac enlargement. IMPRESSION: Lines and tubes as described. Electronically Signed   By: Esperanza Heir M.D.   On: 02/07/2016 20:20   I have personally reviewed and evaluated these images and lab results as part of  my medical decision-making.   EKG Interpretation   Date/Time:  Monday Jan 17, 2016 19:48:04 EST Ventricular Rate:  98 PR Interval:  146 QRS Duration: 108 QT Interval:  390 QTC Calculation: 498 R Axis:   82 Text Interpretation:  Sinus rhythm Repol abnrm, severe global ischemia  (LM/MVD) ischemic changes new since previous  Confirmed by YAO  MD, Nasiah  ([id number]) on 17-Jan-2016 8:15:47 PM      MDM   Final diagnoses:  Cardiac arrest (HCC)  Encounter for central line placement    Patient is a 40 year old male with past medical history of multiple suicide attempts who presents today via EMS after cardiac arrest. The patient intubated upon arrival as above. Patient started on norepinephrine drip for hypotension. Initial EKG with wide spread ST depressions which resolved after repeated EKGs. Cardiology consulted. Patient will be admitted to ICU for further management and evaluation. Cooling initiated.     Marijean Niemann, MD 01/17/16 1131  Richardean Canal, MD 01/17/16 [id number]

## 2016-01-16 NOTE — Procedures (Signed)
Central Venous Catheter Insertion Procedure Note Jonathan Perez [id number] 10/13/1976  Procedure: Insertion of Central Venous Catheter Indications: Assessment of intravascular volume, Drug and/or fluid administration and Frequent blood sampling  Procedure Details Consent: Unable to obtain consent because of emergent medical necessity. Time Out: Verified patient identification, verified procedure, site/side was marked, verified correct patient position, special equipment/implants available, medications/allergies/relevent history reviewed, required imaging and test results available.  Performed  Maximum sterile technique was used including antiseptics, cap, gloves, gown, hand hygiene, mask and sheet. Skin prep: Chlorhexidine; local anesthetic administered A antimicrobial bonded/coated triple lumen catheter was placed in the right internal jugular vein using the Seldinger technique.  Evaluation Blood flow good Complications: No apparent complications Patient did tolerate procedure well. Chest X-ray ordered to verify placement.  CXR: pending.  Jonathan Perez Jan 29, 2016, 10:18 PM  [place]  Jonathan Perez. Jonathan Perez Alias, MD, FACP Pgr: [phone] [facility]

## 2016-01-16 NOTE — Code Documentation (Addendum)
Patient arrived by ems, pt checked in to hotel and then walked out and collapsed, pt received apporx 20 mins of compressions prior to gaining pulses back, with ems initial rhythm was asystole, pt was given 4 narcan and 5 epi with ems. Pt arrived with IO to left tibfib, per ems no drug paraphanalia noted on scene. Pt arrived with king airway in  Place.

## 2016-01-16 NOTE — Progress Notes (Signed)
Spoke to patient's stepfather who is listed int he chart as patient's emergency contact. Informed him of events of today and patient's status. He was thankful and stated he would relay the information to patient's mother. He states that they are all he has. He also mentioned that he talked to the patient earlier today and that he had no SI. He has been staying in a hotel because he has no where else to go and the stepfather has been paying for it. Family will be in tomorrow morning but states please call for any important updates.   Caryl Ada, DO 02-05-16, 11:14 PM PGY-2, [facility]

## 2016-01-16 NOTE — Procedures (Signed)
Transport from ED to CT then to 2H06 without complications.

## 2016-01-16 NOTE — Code Documentation (Signed)
Attempted to intubate with no success

## 2016-01-16 NOTE — ED Notes (Signed)
Pads placed on pt 

## 2016-01-16 NOTE — ED Notes (Signed)
1st bolus cold saline hung

## 2016-01-16 NOTE — H&P (Signed)
PULMONARY / CRITICAL CARE MEDICINE   Name: Jonathan Perez MRN: [id number] DOB: 1976-03-11    ADMISSION DATE:  2016/01/25 CONSULTATION DATE:  01-25-2016  REFERRING MD:  Marijean Niemann, MD  CHIEF COMPLAINT: Cardiac Arrest  HISTORY OF PRESENT ILLNESS:   Jonathan Perez is a 40 y.o. male with PMH significant for HTN, bipolar, chronic pain, multiple prior suicide attempts who presented to the ED by EMS after witnessed cardiac arrest. Patient checked himself into a hotel and about 5 minutes later walked out and collapsed. Patient received approxiametly 20 minutes of CPR with ROSC. Initially with PEA. Was given Narcan with no response and 5 epi. I/O placed in field. In ED patient was intubated and started on NE. Patient with known suicidal attempts by overdose with last attempt 10/2015. Unknown substance currently.   PAST MEDICAL HISTORY :  He  has a past medical history of Chronic back pain; Kidney stones; Kidney stones; ADD (attention deficit disorder); Methadone use (HCC); Bipolar disorder (HCC); Anxiety; Hypertension; and Sleep apnea.  PAST SURGICAL HISTORY: He  has past surgical history that includes Back surgery; Hand surgery; Hip surgery; Back surgery (2005, 2009, 2011); Other surgical history (Left, 2001, 2011); and Other surgical history (Right, 2002).  Allergies  Allergen Reactions  . Reglan [Metoclopramide] Itching    Agitation   . Toradol [Ketorolac Tromethamine] Itching and Rash    No current facility-administered medications on file prior to encounter.   Current Outpatient Prescriptions on File Prior to Encounter  Medication Sig  . Aspirin-Salicylamide-Caffeine (BC HEADACHE POWDER PO) Take 1 each by mouth daily as needed (pain).  Marland Kitchen diphenhydrAMINE (BENADRYL) 25 mg capsule Take 25 mg by mouth every 6 (six) hours as needed for sleep.  . DULoxetine (CYMBALTA) 60 MG capsule Take 1 capsule (60 mg total) by mouth daily.  Marland Kitchen HYDROcodone-acetaminophen (NORCO/VICODIN) 5-325 MG tablet Take  1 tablet by mouth every 4 (four) hours as needed.  . methocarbamol (ROBAXIN) 500 MG tablet Take 2 tablets (1,000 mg total) by mouth every 8 (eight) hours as needed for muscle spasms. (Patient not taking: Reported on 12/26/2015)  . traZODone (DESYREL) 100 MG tablet Take 2 tablets (200 mg total) by mouth at bedtime. (Patient not taking: Reported on 12/26/2015)    FAMILY HISTORY:  His indicated that his mother is alive. He indicated that his father is alive.   SOCIAL HISTORY: He  reports that he has been smoking Cigarettes.  He has been smoking about 0.50 packs per day. He does not have any smokeless tobacco history on file. He reports that he does not drink alcohol or use illicit drugs.  REVIEW OF SYSTEMS:   Unable to obtain due to acute encephalopthy  SUBJECTIVE:  Unresponsive on full vent support without sedation.   VITAL SIGNS: BP 178/115 mmHg  Pulse 109  Temp(Src) 97.1 F (36.2 C) (Temporal)  Resp 15  SpO2 99%  HEMODYNAMICS:    VENTILATOR SETTINGS: Vent Mode:  [-] PRVC FiO2 (%):  [100 %] 100 % Set Rate:  [14 bmp] 14 bmp Vt Set:  [550 mL] 550 mL PEEP:  [5 cmH20] 5 cmH20 Plateau Pressure:  [16 cmH20] 16 cmH20  INTAKE / OUTPUT:    PHYSICAL EXAMINATION: General:  Unresponsive, intubated Neuro: no corneal reflex, absent reflexes  HEENT: dilated pupils bilaterally Cardiovascular: Tachycardia. Regular rhythm  Lungs: CTAB  Abdomen:  Soft, no masses  Musculoskeletal:   Skin:  Warm and dry, intact.   LABS:  BMET  Recent Labs Lab 25-Jan-2016 2030  NA  140  K 3.3*  CL 103  BUN 17  CREATININE 1.70*  GLUCOSE 244*    Electrolytes No results for input(s): CALCIUM, MG, PHOS in the last 168 hours.  CBC  Recent Labs Lab Jan 21, 2016 1950  WBC 17.9*  HGB 14.8  HCT 47.3  PLT 258    Coag's No results for input(s): APTT, INR in the last 168 hours.  Sepsis Markers  Recent Labs Lab 01/15/2016 2001  LATICACIDVEN 13.61*    ABG No results for input(s): PHART,  PCO2ART, PO2ART in the last 168 hours.  Liver Enzymes No results for input(s): AST, ALT, ALKPHOS, BILITOT, ALBUMIN in the last 168 hours.  Cardiac Enzymes No results for input(s): TROPONINI, PROBNP in the last 168 hours.  Glucose No results for input(s): GLUCAP in the last 168 hours.  Imaging Dg Chest Port 1 View  01/24/2016  CLINICAL DATA:  Endotracheal tube and orogastric tube placement EXAM: PORTABLE CHEST 1 VIEW COMPARISON:  12/05/2015 FINDINGS: Lines and tubes partially obscured by spinal rods. Endotracheal tube tip is about 2.5 cm above the carina. Orogastric tube crosses the gastroesophageal junction and loops within the stomach. The stomach is gaseous lead distended. Vascular congestion is present as well as mild cardiac enlargement. IMPRESSION: Lines and tubes as described. Electronically Signed   By: Esperanza Heir M.D.   On: 02/04/2016 20:20    STUDIES:  CT head 2/6>>>  CULTURES: Blood 2/6>> Urine 2/6>>  ANTIBIOTICS: N/A  SIGNIFICANT EVENTS: 2/6 -   LINES/TUBES: ETT PIVs  DISCUSSION: 40 y.o. male with multiple prior attempts  ASSESSMENT / PLAN:  PULMONARY A: Acute Hypoxic Respiratory 2/2 Cardiac Arrest Sleep Apnea At risk for aspiration  P:   Full vent support Follow ABGs Hold SBTs until off paralytics or rewarmed Albuterol prn Differ antibiotics for now  Assess Pct and if high consider starting Abx for aspiration coverage CXR in AM  CARDIOVASCULAR A:  Cardiac arrest - PEA due to presumed overdose/suicide attempt(multiple prior attempts) with 20 minutes of ACLS prior to ROSC H/o HTN  P:  Telemtry  Continue Levo Trend troponin MAP >80 Follow CVPs EKG in AM Echo  Trend lactic acid Cardiology following   RENAL A:   Acute on Chronic Kidney Disease - Cr 2.09 on admission; baseline ~1 Hypokalemia - 3.3 on admission Hypocalcemia Anion gap metabolic acidosis - Lactic acid 13.6, AG 25  P:   BMET in AM Replaced K Monitor  electrolytes Calcium gluconate used Follow-up on UDS, tox levels, acetaminophen/salicylate   GASTROINTESTINAL A:   No acute issues  P:   PPI SCDs NPO IVF Pepcid   HEMATOLOGIC A:   No acute issues  P:  Follow CBC Collect coags  INFECTIOUS A:   No acute issues At risk for aspiration Leukocytosis - but in setting of ACLS  P:   Pan culture Trend WBC Hold off on Abx for now Depending on Pct can consider treatment for anaphylaxis   ENDOCRINE A:   No acute issues   Anticipate hypoglycemia due to cooling  P:   Monitor Hypoglycemia protocol  NEUROLOGIC A:   Acute encephalopathy in setting of cardiac arrest Anoxic brain injury Presumed suicide attempt by overdose(multiple prior attempts including IVC to behavorial health in Oct) - unknown substance - did receive narcan by EMS with no response Chronic pain on methadone  P:   RASS goal: -5 Therapeutic cooling Sedation with fentanyl Paralytics - Nimbex  EEG Consider neuro consult Suicide precautions once extubated Psych consult if return to baseline  and awake   FAMILY  - Updates:   - Inter-disciplinary family meet or Palliative Care meeting due by:  01/23/16   Caryl Ada, DO 01/15/2016, 8:26 PM PGY-2, [facility]  STAFF NOTE: I, Rory Percy, MD FACP have personally reviewed patient's available data, including medical history, events of note, physical examination and test results as part of my evaluation. I have discussed with resident/NP and other care providers such as pharmacist, RN and RRT. In addition, I personally evaluated patient and elicited key findings of: unresponsive, some intermittent head forward movement unclar if myoclonus or gag, pupils 4 mm sluggish, lactic concerning 13 for a poor outcome, but he was witnessed and pea less 30 mn , will pursue hypothermia for now, get eeg r/o subclinical seizures, goal temp 32-34 degrees, CT head on way up, initiate nimbex, prop, fent,  goal MAP 80, ABG stat, place line, get cvp, a line consideration , would be an ME case discussion is expires, get drug levels tylenal, sal, tox screen, has attempted suicide in past, if re arrest would be futile to repeat cpr, goal rass -5 then paralysis, NO role BIS The patient is critically ill with multiple organ systems failure and requires high complexity decision making for assessment and support, frequent evaluation and titration of therapies, application of advanced monitoring technologies and extensive interpretation of multiple databases.   Critical Care Time devoted to patient care services described in this note is45 Minutes. This time reflects time of care of this signee: Rory Percy, MD FACP. This critical care time does not reflect procedure time, or teaching time or supervisory time of PA/NP/Med student/Med Resident etc but could involve care discussion time. Rest per NP/medical resident whose note is outlined above and that I agree with   [name]. [name], MD, FACP Pgr: [phone] [facility] 01/29/2016 10:21 PM

## 2016-01-17 ENCOUNTER — Inpatient Hospital Stay (HOSPITAL_COMMUNITY): Payer: Medicare Other

## 2016-01-17 DIAGNOSIS — G9340 Encephalopathy, unspecified: Secondary | ICD-10-CM

## 2016-01-17 DIAGNOSIS — J9601 Acute respiratory failure with hypoxia: Secondary | ICD-10-CM

## 2016-01-17 LAB — POCT I-STAT 3, ART BLOOD GAS (G3+)
ACID-BASE DEFICIT: 6 mmol/L — AB (ref 0.0–2.0)
Acid-base deficit: 9 mmol/L — ABNORMAL HIGH (ref 0.0–2.0)
Bicarbonate: 20.2 mEq/L (ref 20.0–24.0)
Bicarbonate: 20.6 mEq/L (ref 20.0–24.0)
O2 SAT: 100 %
O2 SAT: 97 %
PCO2 ART: 47.5 mmHg — AB (ref 35.0–45.0)
PCO2 ART: 37.4 mmHg (ref 35.0–45.0)
PH ART: 7.217 — AB (ref 7.350–7.450)
PH ART: 7.326 — AB (ref 7.350–7.450)
PO2 ART: 202 mmHg — AB (ref 80.0–100.0)
Patient temperature: 33.4
TCO2: 22 mmol/L (ref 0–100)
TCO2: 22 mmol/L (ref 0–100)
pO2, Arterial: 78 mmHg — ABNORMAL LOW (ref 80.0–100.0)

## 2016-01-17 LAB — GLUCOSE, CAPILLARY
GLUCOSE-CAPILLARY: 101 mg/dL — AB (ref 65–99)
GLUCOSE-CAPILLARY: 109 mg/dL — AB (ref 65–99)
GLUCOSE-CAPILLARY: 114 mg/dL — AB (ref 65–99)
GLUCOSE-CAPILLARY: 127 mg/dL — AB (ref 65–99)
GLUCOSE-CAPILLARY: 152 mg/dL — AB (ref 65–99)
GLUCOSE-CAPILLARY: 158 mg/dL — AB (ref 65–99)
GLUCOSE-CAPILLARY: 164 mg/dL — AB (ref 65–99)
GLUCOSE-CAPILLARY: 169 mg/dL — AB (ref 65–99)
GLUCOSE-CAPILLARY: 191 mg/dL — AB (ref 65–99)
GLUCOSE-CAPILLARY: 214 mg/dL — AB (ref 65–99)
GLUCOSE-CAPILLARY: 221 mg/dL — AB (ref 65–99)
GLUCOSE-CAPILLARY: 88 mg/dL (ref 65–99)
GLUCOSE-CAPILLARY: 95 mg/dL (ref 65–99)
Glucose-Capillary: 170 mg/dL — ABNORMAL HIGH (ref 65–99)
Glucose-Capillary: 113 mg/dL — ABNORMAL HIGH (ref 65–99)
Glucose-Capillary: 89 mg/dL (ref 65–99)
Glucose-Capillary: 152 mg/dL — ABNORMAL HIGH (ref 65–99)
Glucose-Capillary: 58 mg/dL — ABNORMAL LOW (ref 65–99)

## 2016-01-17 LAB — TROPONIN I
TROPONIN I: 0.63 ng/mL — AB (ref <0.031)
TROPONIN I: 1.22 ng/mL — AB (ref <0.031)
TROPONIN I: 1.09 ng/mL — AB (ref <0.031)
Troponin I: 0.21 ng/mL — ABNORMAL HIGH (ref <0.031)

## 2016-01-17 LAB — BASIC METABOLIC PANEL
ANION GAP: 12 (ref 5–15)
ANION GAP: 13 (ref 5–15)
Anion gap: 17 — ABNORMAL HIGH (ref 5–15)
Anion gap: 13 (ref 5–15)
BUN: 16 mg/dL (ref 6–20)
BUN: 17 mg/dL (ref 6–20)
BUN: 14 mg/dL (ref 6–20)
BUN: 16 mg/dL (ref 6–20)
CALCIUM: 8 mg/dL — AB (ref 8.9–10.3)
CALCIUM: 8.2 mg/dL — AB (ref 8.9–10.3)
CALCIUM: 8.2 mg/dL — AB (ref 8.9–10.3)
CO2: 19 mmol/L — AB (ref 22–32)
CO2: 18 mmol/L — ABNORMAL LOW (ref 22–32)
CO2: 20 mmol/L — ABNORMAL LOW (ref 22–32)
CO2: 20 mmol/L — AB (ref 22–32)
CREATININE: 1.29 mg/dL — AB (ref 0.61–1.24)
CREATININE: 1.29 mg/dL — AB (ref 0.61–1.24)
CREATININE: 1.24 mg/dL (ref 0.61–1.24)
Calcium: 7.8 mg/dL — ABNORMAL LOW (ref 8.9–10.3)
Chloride: 105 mmol/L (ref 101–111)
Chloride: 112 mmol/L — ABNORMAL HIGH (ref 101–111)
Chloride: 112 mmol/L — ABNORMAL HIGH (ref 101–111)
Chloride: 112 mmol/L — ABNORMAL HIGH (ref 101–111)
Creatinine, Ser: 1.6 mg/dL — ABNORMAL HIGH (ref 0.61–1.24)
GFR calc Af Amer: >60 mL/min (ref >60)
GFR calc non Af Amer: >60 mL/min (ref >60)
GFR, EST NON AFRICAN AMERICAN: 52 mL/min — AB (ref >60)
GLUCOSE: 212 mg/dL — AB (ref 65–99)
Glucose, Bld: 118 mg/dL — ABNORMAL HIGH (ref 65–99)
Glucose, Bld: 106 mg/dL — ABNORMAL HIGH (ref 65–99)
Glucose, Bld: 101 mg/dL — ABNORMAL HIGH (ref 65–99)
POTASSIUM: 3.4 mmol/L — AB (ref 3.5–5.1)
Potassium: 4.8 mmol/L (ref 3.5–5.1)
Potassium: 3.8 mmol/L (ref 3.5–5.1)
Potassium: 3.5 mmol/L (ref 3.5–5.1)
SODIUM: 142 mmol/L (ref 135–145)
SODIUM: 145 mmol/L (ref 135–145)
SODIUM: 144 mmol/L (ref 135–145)
Sodium: 142 mmol/L (ref 135–145)

## 2016-01-17 LAB — PROTIME-INR
INR: 1.1 (ref 0.00–1.49)
INR: 1.12 (ref 0.00–1.49)
Prothrombin Time: 14.4 seconds (ref 11.6–15.2)
Prothrombin Time: 14.6 seconds (ref 11.6–15.2)

## 2016-01-17 LAB — POCT I-STAT, CHEM 8
BUN: 16 mg/dL (ref 6–20)
BUN: 18 mg/dL (ref 6–20)
BUN: 18 mg/dL (ref 6–20)
CALCIUM ION: 0.97 mmol/L — AB (ref 1.12–1.23)
CALCIUM ION: 1.07 mmol/L — AB (ref 1.12–1.23)
CALCIUM ION: 1.1 mmol/L — AB (ref 1.12–1.23)
CHLORIDE: 109 mmol/L (ref 101–111)
CHLORIDE: 110 mmol/L (ref 101–111)
Chloride: 106 mmol/L (ref 101–111)
Creatinine, Ser: 1 mg/dL (ref 0.61–1.24)
Creatinine, Ser: 1.1 mg/dL (ref 0.61–1.24)
Creatinine, Ser: 1.3 mg/dL — ABNORMAL HIGH (ref 0.61–1.24)
GLUCOSE: 118 mg/dL — AB (ref 65–99)
GLUCOSE: 212 mg/dL — AB (ref 65–99)
Glucose, Bld: 120 mg/dL — ABNORMAL HIGH (ref 65–99)
HCT: 54 % — ABNORMAL HIGH (ref 39.0–52.0)
HCT: 60 % — ABNORMAL HIGH (ref 39.0–52.0)
HEMATOCRIT: 53 % — AB (ref 39.0–52.0)
HEMOGLOBIN: 18.4 g/dL — AB (ref 13.0–17.0)
Hemoglobin: 18 g/dL — ABNORMAL HIGH (ref 13.0–17.0)
Hemoglobin: 20.4 g/dL — ABNORMAL HIGH (ref 13.0–17.0)
POTASSIUM: 4.9 mmol/L (ref 3.5–5.1)
Potassium: 3.3 mmol/L — ABNORMAL LOW (ref 3.5–5.1)
Potassium: 4.6 mmol/L (ref 3.5–5.1)
SODIUM: 143 mmol/L (ref 135–145)
SODIUM: 143 mmol/L (ref 135–145)
SODIUM: 144 mmol/L (ref 135–145)
TCO2: 19 mmol/L (ref 0–100)
TCO2: 20 mmol/L (ref 0–100)
TCO2: 22 mmol/L (ref 0–100)

## 2016-01-17 LAB — MAGNESIUM
MAGNESIUM: 1.6 mg/dL — AB (ref 1.7–2.4)
Magnesium: 1.7 mg/dL (ref 1.7–2.4)

## 2016-01-17 LAB — CBC
HCT: 51.9 % (ref 39.0–52.0)
HCT: 49.7 % (ref 39.0–52.0)
Hemoglobin: 17.4 g/dL — ABNORMAL HIGH (ref 13.0–17.0)
Hemoglobin: 17.2 g/dL — ABNORMAL HIGH (ref 13.0–17.0)
MCH: 33 pg (ref 26.0–34.0)
MCH: 33.9 pg (ref 26.0–34.0)
MCHC: 33.5 g/dL (ref 30.0–36.0)
MCHC: 34.6 g/dL (ref 30.0–36.0)
MCV: 98.3 fL (ref 78.0–100.0)
MCV: 97.8 fL (ref 78.0–100.0)
PLATELETS: 265 10*3/uL (ref 150–400)
PLATELETS: 283 10*3/uL (ref 150–400)
RBC: 5.28 MIL/uL (ref 4.22–5.81)
RBC: 5.08 MIL/uL (ref 4.22–5.81)
RDW: 13.8 % (ref 11.5–15.5)
RDW: 13.9 % (ref 11.5–15.5)
WBC: 33.8 10*3/uL — AB (ref 4.0–10.5)
WBC: 32.9 10*3/uL — ABNORMAL HIGH (ref 4.0–10.5)

## 2016-01-17 LAB — APTT
APTT: 30 s (ref 24–37)
aPTT: 30 seconds (ref 24–37)

## 2016-01-17 LAB — RAPID URINE DRUG SCREEN, HOSP PERFORMED
Amphetamines: NOT DETECTED
BARBITURATES: NOT DETECTED
BENZODIAZEPINES: POSITIVE — AB
Cocaine: POSITIVE — AB
Opiates: POSITIVE — AB
TETRAHYDROCANNABINOL: NOT DETECTED

## 2016-01-17 LAB — PROCALCITONIN
PROCALCITONIN: 2.58 ng/mL
PROCALCITONIN: 5.66 ng/mL

## 2016-01-17 LAB — ACETAMINOPHEN LEVEL: Acetaminophen (Tylenol), Serum: <10 ug/mL — ABNORMAL LOW (ref 10–30)

## 2016-01-17 LAB — LACTIC ACID, PLASMA
LACTIC ACID, VENOUS: 2.7 mmol/L — AB (ref 0.5–2.0)
Lactic Acid, Venous: 2.7 mmol/L (ref 0.5–2.0)

## 2016-01-17 LAB — OSMOLALITY: OSMOLALITY: 300 mosm/kg — AB (ref 275–295)

## 2016-01-17 LAB — SALICYLATE LEVEL: Salicylate Lvl: <4 mg/dL (ref 2.8–30.0)

## 2016-01-17 LAB — MRSA PCR SCREENING: MRSA BY PCR: NEGATIVE

## 2016-01-17 LAB — PATHOLOGIST SMEAR REVIEW

## 2016-01-17 LAB — PHOSPHORUS: Phosphorus: 3.9 mg/dL (ref 2.5–4.6)

## 2016-01-17 MED ORDER — SODIUM CHLORIDE 0.9% FLUSH
10.0000 mL | Freq: Two times a day (BID) | INTRAVENOUS | Status: DC
  Administered 2016-01-17 – 2016-01-19 (×5): 10 mL
  Administered 2016-01-20: 30 mL
  Administered 2016-01-20: 10 mL

## 2016-01-17 MED ORDER — CISATRACURIUM BESYLATE (PF) 200 MG/20ML IV SOLN
1.0000 ug/kg/min | INTRAVENOUS | Status: DC
  Administered 2016-01-17: 1 ug/kg/min via INTRAVENOUS
  Filled 2016-01-17 (×2): qty 20

## 2016-01-17 MED ORDER — CHLORHEXIDINE GLUCONATE 0.12% ORAL RINSE (MEDLINE KIT)
15.0000 mL | Freq: Two times a day (BID) | OROMUCOSAL | Status: DC
  Administered 2016-01-17 – 2016-01-20 (×7): 15 mL via OROMUCOSAL

## 2016-01-17 MED ORDER — SODIUM CHLORIDE 0.9% FLUSH: 10.0000 mL | INTRAVENOUS | Status: DC | PRN

## 2016-01-17 MED ORDER — DEXTROSE 50 % IV SOLN
INTRAVENOUS | Status: AC
  Administered 2016-01-17: 25 mL
  Filled 2016-01-17: qty 50

## 2016-01-17 MED ORDER — ANTISEPTIC ORAL RINSE SOLUTION (CORINZ): 7.0000 mL | Freq: Four times a day (QID) | OROMUCOSAL | Status: DC

## 2016-01-17 MED ORDER — SODIUM CHLORIDE 0.9 % IV SOLN
25.0000 ug/h | INTRAVENOUS | Status: DC
  Administered 2016-01-17 (×2): 250 ug/h via INTRAVENOUS
  Administered 2016-01-18: 50 ug/h via INTRAVENOUS
  Administered 2016-01-18: 250 ug/h via INTRAVENOUS
  Filled 2016-01-17 (×3): qty 50

## 2016-01-17 MED ORDER — POTASSIUM CHLORIDE 10 MEQ/50ML IV SOLN
10.0000 meq | INTRAVENOUS | Status: AC
  Administered 2016-01-17 (×4): 10 meq via INTRAVENOUS
  Filled 2016-01-17 (×4): qty 50

## 2016-01-17 MED ORDER — CISATRACURIUM BOLUS VIA INFUSION
0.1000 mg/kg | Freq: Once | INTRAVENOUS | Status: AC
  Filled 2016-01-17: qty 10

## 2016-01-17 MED ORDER — ANTISEPTIC ORAL RINSE SOLUTION (CORINZ)
7.0000 mL | OROMUCOSAL | Status: DC
  Administered 2016-01-17 – 2016-01-20 (×29): 7 mL via OROMUCOSAL

## 2016-01-17 MED ORDER — CISATRACURIUM BOLUS VIA INFUSION
0.0500 mg/kg | INTRAVENOUS | Status: DC | PRN
  Filled 2016-01-17: qty 5

## 2016-01-17 NOTE — Care Management Note (Signed)
Case Management Note  Patient Details  Name: Jonathan Perez MRN: [id number] Date of Birth: 1976-10-19  Subjective/Objective:           Adm w cardiac arrest, vent        Action/Plan: lives at home   Expected Discharge Date:                  Expected Discharge Plan:     In-House Referral:     Discharge planning Services     Post Acute Care Choice:    Choice offered to:     DME Arranged:    DME Agency:     HH Arranged:    HH Agency:     Status of Service:     Medicare Important Message Given:    Date Medicare IM Given:    Medicare IM give by:    Date Additional Medicare IM Given:    Additional Medicare Important Message give by:     If discussed at Long Length of Stay Meetings, dates discussed:    Additional Comments: ur review done  Hanley Hays, RN 01/17/2016, 8:32 AM

## 2016-01-17 NOTE — Progress Notes (Signed)
Pt. Blood glucose 53 at 2200.  Gave 25 mL of D50 per hypoglycemia protocol.  Blood glucose 152 when rechecked 15 min later.  Will continue to monitor.

## 2016-01-17 NOTE — Progress Notes (Signed)
RN unable to obtain CVP reading. No blood return from distal lumen.  Line flushes easily and all connections checked.  MD Byrum reviewed chest x-ray and central line placement confirmed.  MD Byrum aware RN unable to obtain CVP readings.

## 2016-01-17 NOTE — Procedures (Signed)
ELECTROENCEPHALOGRAM REPORT   Patient: Jonathan Perez      Room #: 2H-06 Age: 40 y.o.        Sex: male Referring Physician: Dr [name] CCM Report Date:  01/17/2016        Interpreting Physician: Omelia Blackwater  History: KERON NEENAN is an 40 y.o. male post cardiac arrest  Medications:  On hypothermia protocol on propofol, nimbex and fentanyl  Conditions of Recording:  This is a 16 channel EEG carried out with the patient in the intubated and sedated state while undergoing hypothermia protocol.  Description:  Markedly suppressed generalized low voltage background rhythm. No noted response to stimuli. No focal slowing or epileptiform activity. Normal sleep architecture not observed.   Hyperventilation was not performed. Intermittent photic stimulation was not performed.   IMPRESSION: Abnormal EEG due to the presence of generalized suppression of the background rhythm. This can be secondary to medication effect. Prognostic value limited in the setting of ongoing hypothermia protocol.    Elspeth Cho, DO [facility] [phone]  If 7pm- 7am, please page neurology on call as listed in AMION. 01/17/2016, 10:33 AM

## 2016-01-17 NOTE — Progress Notes (Signed)
RT increased RR to 18 per MD order. RT will continue to monitor.

## 2016-01-17 NOTE — Progress Notes (Signed)
Initial Nutrition Assessment  DOCUMENTATION CODES:   Obesity unspecified  INTERVENTION:  - Once re-warmed recommend Vital high protein @ goal rate of 10 mL/hr and 60 mL Prostat QID, which provides 1040 kcal, 141 grams of protein, and 200 mL of water   NUTRITION DIAGNOSIS:   Inadequate oral intake related to inability to eat as evidenced by NPO status.  GOAL:   Provide needs based on ASPEN/SCCM guidelines  MONITOR:   Diet advancement, Vent status  REASON FOR ASSESSMENT:   Malnutrition Screening Tool   ASSESSMENT:   Pt with PMH significant for HTN, bipolar, chronic pain, multiple prior suicide attempts who presented to the ED by EMS after witnessed cardiac arrest.  2/6-intubated  Conducted nutrition focused physical exam, identified no muscle or fat wasting.  Reviewed medications; propofol at 29.9 mL/hr, which provides 789 kcal.  Reviewed labs.   Diet Order:  Diet NPO time specified Except for: Sips with Meds  Skin:  Reviewed, no issues  Last BM:  unkown  Height:   Ht Readings from Last 1 Encounters:  01/31/2016  (1.727 m)    Weight:   Wt Readings from Last 1 Encounters:  01/17/2016 219 lb 5.7 oz (99.5 kg)    Ideal Body Weight:  70 kg  BMI:  Body mass index is 33.36 kg/(m^2).  Estimated Nutritional Needs:   Kcal:  1250-1450  Protein:  >140  Fluid:  1.5 L  EDUCATION NEEDS:   No education needs identified at this time  [place], Dietetic Intern Pager: [phone]

## 2016-01-17 NOTE — Progress Notes (Signed)
eLink Physician-Brief Progress Note Patient Name: INIOLUWA BARIS DOB: [birth year] MRN: [id number]   Date of Service  01/17/2016  HPI/Events of Note  K+ = 3.4 and Creatinine = 1.6.  eICU Interventions  Will replete K+.     Intervention Category Intermediate Interventions: Electrolyte abnormality - evaluation and management  Sommer,Steven Eugene 01/17/2016, 2:37 AM

## 2016-01-17 NOTE — Progress Notes (Signed)
eLink Physician-Brief Progress Note Patient Name: Jonathan Perez DOB: 06-24-1976 MRN: [id number]   Date of Service  01/17/2016  HPI/Events of Note  ABG on 100%/PRVC 14/TV 590/P 5 = 7.21/47.5/202  eICU Interventions  Will order: 1. Increase PRVC rate to 18 and repeat ABG at 5 AM.     Intervention Category Major Interventions: Respiratory failure - evaluation and management  Joshoa Shawler Eugene 01/17/2016, 2:09 AM

## 2016-01-17 NOTE — Progress Notes (Signed)
[employer] machine said that rewarming was supposed to begin at 22:30.  After checking MAR and confirming with RN that took care of him today rewarming is supposed to begin at 0230, 01/18/16, 24 hours after target temp was achieved.  [employer] called and said that sometimes the machine will try to re warm 24 hours after therapy was started not after target temp was achieved.  They said to add time to the machine. Time was added and rewarming will begin at 2:30 01/18/16.

## 2016-01-17 NOTE — Progress Notes (Signed)
SUBJECTIVE: intubated, sedated. Off Levo.  On nimbex. BP better overnight.   BP 114/90 mmHg  Pulse 76  Temp(Src) 92.8 F (33.8 C) (Core (Comment))  Resp 18  Ht  (1.727 m)  Wt 219 lb 5.7 oz (99.5 kg)  BMI 33.36 kg/m2  SpO2 100%  Intake/Output Summary (Last 24 hours) at 01/17/16 0745 Last data filed at 01/17/16 0600  Gross per 24 hour  Intake 2103.63 ml  Output   4100 ml  Net -1996.37 ml    PHYSICAL EXAM General: overweight male, sedated, intubated.  Neck: has R IJ CV line.  Lungs: coarse breathe sounds, limited exam anteriorly.  Heart: RRR with no murmurs noted. Abdomen: Bowel sounds are present. Soft, non-tender.  Extremities: No lower extremity edema.   LABS: Basic Metabolic Panel:  Recent Labs  16/10/96 1950  01/17/16 0025 01/17/16 0030 01/17/16 0408  NA 138  < > 142 144  --   K 3.3*  < > 3.4* 3.3*  --   CL 97*  < > 105 106  --   CO2 16*  --  20*  --   --   GLUCOSE 263*  < > 212* 212*  --   BUN 12  < > 14 16  --   CREATININE 2.09*  < > 1.60* 1.30*  --   CALCIUM 8.3*  --  7.8*  --   --   MG  --   --  1.6*  --  1.7  PHOS  --   --   --   --  3.9  < > = values in this interval not displayed. CBC:  Recent Labs  01/23/16 1950  01/17/16 0030 01/17/16 0445  WBC 17.9*  --   --  33.8*  NEUTROABS 9.9*  --   --   --   HGB 14.8  < > 20.4* 17.4*  HCT 47.3  < > 60.0* 51.9  MCV 105.3*  --   --  98.3  PLT 258  --   --  265  < > = values in this interval not displayed. Cardiac Enzymes:  Recent Labs  01/17/16 0025 01/17/16 0334  TROPONINI 0.21* 0.63*   Fasting Lipid Panel: No results for input(s): CHOL, HDL, LDLCALC, TRIG, CHOLHDL, LDLDIRECT in the last 72 hours.  Current Meds: . antiseptic oral rinse  7 mL Mouth Rinse QID  . artificial tears  1 application Both Eyes 3 times per day  . chlorhexidine gluconate  15 mL Mouth Rinse BID  . famotidine (PEPCID) IV  20 mg Intravenous Q12H  . heparin  5,000 Units Subcutaneous 3 times per day  .  insulin aspart  2-6 Units Subcutaneous 6 times per day     ASSESSMENT AND PLAN:  Active Problems:   Cardiac arrest [facility])   Encounter for central line placement   40 yo male presented s/p witnessed cardiac arrest with 20 mins of ACLS  Cardiac arrest - s/p 20 mins ACLS, was in PEA initially. EKG showed inferolateral ST depression but now repeat EKG shows resolution of that. Troponin mildly elevated likely 2/2 to CPR. Doubt cardiac etiology. This is most likely 2/2 to drug overdose (opiates vs benzo) causing resp failure leading to cardiac arrest. No prior cardiac hx. Currently on therapeutic hypothermia protocol. CT head showed hypoxic brain injury.  - continue to trend EKG. Echo pending. We will follow with you. Needs psych consult if patient's mental status improves.   AKI - crt improving. Wonder if  this is 2/2 to cocaine renal vasospasm vs hypoxic renal injury from cardiac arrest. Monitor closely. Has good UOP.  Lactic acidosis - improved.   Jonathan Perez  2/7/20177:45 AM  Patient seen and examined and history reviewed. Agree with above findings and plan. 40 yo WM with witnessed PEA arrest. Apparently had 20 minutes of CPR before ROSC. Now intubated, sedated, and cooled. Initial Ecg showed diffuse ST elevation that resolved on repeat. Now with prolonged QT probably related to cooling. Troponin minimally elevated consistent with arrest. Very unlikely to be a primary ACS. Will check Echo today. Unfortunately has some degree of anoxic encephalopathy. Will need to reevaluate once rewarmed. Will follow with CCM.   Jonathan Perez, MDFACC 01/17/2016 9:19 AM

## 2016-01-17 NOTE — Progress Notes (Signed)
EEG Completed; Results Pending  

## 2016-01-17 NOTE — Procedures (Signed)
Arterial Catheter Insertion Procedure Note MCCAULEY DIEHL [id number] [birth year]  Procedure: Insertion of Arterial Catheter  Indications: Blood pressure monitoring and Frequent blood sampling  Procedure Details Consent: Unable to obtain consent because of emergent medical necessity. Time Out: Verified patient identification, verified procedure, site/side was marked, verified correct patient position, special equipment/implants available, medications/allergies/relevent history reviewed, required imaging and test results available.  Performed  Maximum sterile technique was used including antiseptics, cap, gloves, gown, hand hygiene, mask and sheet. Skin prep: Chlorhexidine; local anesthetic administered 22 gauge catheter was inserted into left radial artery using the Seldinger technique.  Evaluation Blood flow good; BP tracing good. Complications: No apparent complications.   Letta Median 01/17/2016 2:30 AM

## 2016-01-17 NOTE — Progress Notes (Signed)
PULMONARY / CRITICAL CARE MEDICINE   Name: Jonathan Perez MRN: [id number] DOB: 18-Mar-1976    ADMISSION DATE:  2016-02-01 CONSULTATION DATE:  02/01/2016  REFERRING MD:  Marijean Niemann, MD  CHIEF COMPLAINT: Cardiac Arrest  HISTORY OF PRESENT ILLNESS:   Jonathan Perez is a 40 y.o. male with PMH significant for HTN, bipolar, chronic pain, multiple prior suicide attempts who presented to the ED by EMS after witnessed cardiac arrest. Patient checked himself into a hotel and about 5 minutes later walked out and collapsed. Patient received approxiametly 20 minutes of CPR with ROSC. Initially with PEA. Was given Narcan with no response and 5 epi. I/O placed in field. In ED patient was intubated and started on NE. Patient with known suicidal attempts by overdose with last attempt 10/2015. Unknown substance currently.    SUBJECTIVE:  Unresponsive on full vent support without sedation.    VITAL SIGNS: BP 108/87 mmHg  Pulse 68  Temp(Src) 92.3 F (33.5 C) (Core (Comment))  Resp 18  Ht  (1.727 m)  Wt 99.5 kg (219 lb 5.7 oz)  BMI 33.36 kg/m2  SpO2 100%  HEMODYNAMICS:    VENTILATOR SETTINGS: Vent Mode:  [-] PRVC FiO2 (%):  [50 %-100 %] 50 % Set Rate:  [14 bmp-18 bmp] 18 bmp Vt Set:  [550 mL-590 mL] 590 mL PEEP:  [5 cmH20] 5 cmH20 Plateau Pressure:  [10 cmH20-18 cmH20] 18 cmH20  INTAKE / OUTPUT: I/O last 3 completed shifts: In: 2164.6 [I.V.:1964.6; IV Piggyback:200] Out: 4100 [Urine:4100]  PHYSICAL EXAMINATION: General:  Unresponsive, intubated Neuro: no corneal reflex, absent reflexes  HEENT: dilated pupils bilaterally Cardiovascular: Tachycardia. Regular rhythm  Lungs: CTAB  Abdomen:  Soft, no masses  Musculoskeletal:   Skin:  Warm and dry, intact.   LABS:  BMET  Recent Labs Lab 02/01/16 1950  01/17/16 0025 01/17/16 0030 01/17/16 0826  NA 138  < > 142 144 143  K 3.3*  < > 3.4* 3.3* 4.9  CL 97*  < > 105 106 109  CO2 16*  --  20*  --   --   BUN 12  < > CREATININE 2.09*  < > 1.60* 1.30* 1.10  GLUCOSE 263*  < > 212* 212* 118*  < > = values in this interval not displayed.  Electrolytes  Recent Labs Lab 02/01/16 1950 01/17/16 0025 01/17/16 0408  CALCIUM 8.3* 7.8*  --   MG  --  1.6* 1.7  PHOS  --   --  3.9    CBC  Recent Labs Lab 2016-02-01 1950  01/17/16 0445 01/17/16 0826 01/17/16 0830  WBC 17.9*  --  33.8*  --  32.9*  HGB 14.8  < > 17.4* 18.4* 17.2*  HCT 47.3  < > 51.9 54.0* 49.7  PLT 258  --  265  --  283  < > = values in this interval not displayed.  Coag's  Recent Labs Lab 01/17/16 0025 01/17/16 0830  APTT 30 30  INR 1.10 1.12    Sepsis Markers  Recent Labs Lab 02-01-16 2001 01/17/16 0025 01/17/16 0341 01/17/16 0408  LATICACIDVEN 13.61* 2.7* 2.7*  --   PROCALCITON  --  2.58  --  5.66    ABG  Recent Labs Lab 01/17/16 0202 01/17/16 0414  PHART 7.217* 7.326*  PCO2ART 47.5* 37.4  PO2ART 202.0* 78.0*    Liver Enzymes  Recent Labs Lab 02-01-2016 1950  AST 133*  ALT 126*  ALKPHOS 95  BILITOT 0.4  ALBUMIN 3.2*    Cardiac Enzymes  Recent Labs Lab 01/17/16 0025 01/17/16 0334 01/17/16 0840  TROPONINI 0.21* 0.63* 1.22*    Glucose  Recent Labs Lab 01/17/16 0322 01/17/16 0406 01/17/16 0528 01/17/16 0610 01/17/16 0705 01/17/16 0823  GLUCAP 191* 169* 164* 152* 127* 109*    Imaging Ct Head Wo Contrast  01/20/2016  CLINICAL DATA:  40 year old male with cardiac arrest EXAM: CT HEAD WITHOUT CONTRAST TECHNIQUE: Contiguous axial images were obtained from the base of the skull through the vertex without intravenous contrast. COMPARISON:  Head CT dated 03/09/2014 FINDINGS: There is mild diffuse hypoattenuation of the brain parenchyma with a degree of indistinctness of the great white matter differentiation concerning for underlying edema. There is also partial effacement of sulci when compared to the prior study. No intracranial hemorrhage identified. There is no midline shift. There is  hypoattenuation of the anterior and middle cerebral arteries bilaterally which may represent hemoconcentration or be related to a background of parenchymal edema. There is diffuse mucoperiosteal thickening and partial opacification of paranasal sinuses. The mastoid air cells all clear. The calvarium is intact. An endotracheal and enteric tubes are partially visualized. IMPRESSION: Diffuse hypoattenuation of the brain parenchymal with a degree of loss of gray-white matter discrimination concerning for edema and suggestive of a degree of hypoxic injury. Clinical correlation and close follow-up recommended. MRI may provide better evaluation. No acute intracranial hemorrhage or midline shift. These results were called by telephone at the time of interpretation on 01/31/2016 at 9:44 pm to Dr. Silverio Lay, who verbally acknowledged these results. Electronically Signed   By: Elgie Collard M.D.   On: 01/19/2016 21:44   Dg Chest Port 1 View  01/17/2016  CLINICAL DATA:  Central line placement. EXAM: PORTABLE CHEST 1 VIEW COMPARISON:  01/27/2016. FINDINGS: Endotracheal tube, NG tube in stable position. Left chest tube noted over left lower chest. Heart size normal. Low lung volumes with bibasilar atelectasis. Mild infiltrate left mid lower lung. No pleural effusion or pneumothorax. Prior thoracolumbar spine fusion. IMPRESSION: 1. Endotracheal tube and NG tube in stable position. Left chest tube noted over left lower chest. No pneumothorax. 2. Persistent low lung volumes with basilar atelectasis. Left mid lung mild infiltrate noted. Electronically Signed   By: Maisie Fus  Register   On: 01/17/2016 07:20   Dg Chest Port 1 View  02/02/2016  CLINICAL DATA:  Central line placement.  Initial encounter. EXAM: PORTABLE CHEST 1 VIEW COMPARISON:  Chest radiograph performed earlier today at 8:04 p.m. FINDINGS: The patient's endotracheal tube is seen ending 2-3 cm above the carina. A right IJ line is noted extending across the midline, ending  about the left brachiocephalic vein. An enteric tube is noted extending below the diaphragm. Vascular congestion is noted, with mild bilateral atelectasis. No definite pleural effusion or pneumothorax is seen. The cardiomediastinal silhouette is borderline normal in size. No acute osseous abnormalities are identified. IMPRESSION: 1. Right IJ line noted extending across the midline, ending about the left brachiocephalic vein. 2. Endotracheal tube seen ending 2-3 cm above the carina. 3. Vascular congestion, with mild bilateral atelectasis. Electronically Signed   By: Roanna Raider M.D.   On: 02/01/2016 23:24   Dg Chest Port 1 View  01/24/2016  CLINICAL DATA:  Endotracheal tube and orogastric tube placement EXAM: PORTABLE CHEST 1 VIEW COMPARISON:  12/05/2015 FINDINGS: Lines and tubes partially obscured by spinal rods. Endotracheal tube tip is about 2.5 cm above the carina. Orogastric tube crosses the gastroesophageal junction and loops within the stomach.  The stomach is gaseous lead distended. Vascular congestion is present as well as mild cardiac enlargement. IMPRESSION: Lines and tubes as described. Electronically Signed   By: Esperanza Heir M.D.   On: Feb 14, 2016 20:20    STUDIES:  CT head 2/6>>> loss of gray/white interface, concern for ischemic injury and edema  CULTURES: Blood 2/6>> Urine 2/6>>  ANTIBIOTICS: N/A  SIGNIFICANT EVENTS: 2/6 -   LINES/TUBES: ETT PIVs  DISCUSSION: 40 y.o. male with multiple prior suicide attempts, resp and then cardiac arrest. Intubated and supported w therapeutic hypothermia. Head Ct concerning for ischemic injury  ASSESSMENT / PLAN:  PULMONARY A: Acute Hypoxic Respiratory failure 2/2 Cardiac Arrest Sleep Apnea At risk for aspiration  P:   Full vent support Hold SBTs until off paralytics or rewarmed Albuterol prn Differ antibiotics for now  Follow CXR for infiltrates, consider empiric abx if evolving. None on 2/7  CARDIOVASCULAR A:  Cardiac  arrest - PEA due to presumed overdose/suicide attempt (multiple prior attempts) with 20 minutes of ACLS prior to ROSC H/o HTN  P:  Telemtry  Norepi for goal MAP while cooled, MAP >80 Trend troponin Follow CVPs Echo pending Lactate has cleared 2/7 Cardiology following   RENAL A:   Acute on Chronic Kidney Disease - Cr 2.09 on admission; resolved Hypokalemia - 3.3 on admission, resolved Hypocalcemia Anion gap metabolic acidosis - Lactic acid 13.6, AG 25; resolved  P:   Monitor electrolytes   GASTROINTESTINAL A:   No acute issues  P:   PPI SCDs NPO IVF Pepcid   HEMATOLOGIC A:   No acute issues  P:  Follow CBC, coags   INFECTIOUS A:   No acute issues At risk for aspiration Leukocytosis - but in setting of ACLS  P:   Pan culture, follow Trend WBC Hold off on Abx for now  ENDOCRINE A:   No acute issues   Anticipate hypoglycemia due to cooling  P:   Monitor Follow CBG  NEUROLOGIC A:   Acute encephalopathy in setting of cardiac arrest Anoxic brain injury Presumed suicide attempt by overdose (multiple prior attempts including IVC to behavorial health in Oct) - unknown substance - did receive narcan by EMS with no response. ASA and APAP levels negative Chronic pain on methadone Cocaine positive P:   RASS goal: -5 Therapeutic cooling, rewarming to begin 0200 Sedation with fentanyl Paralytics - Nimbex  EEG pending now Consider neuro consult for prognosis as we progress Suicide precautions once extubated Psych consult if return to baseline and awake   FAMILY  - Updates: RB spoke with family at bedside 2/7  - Inter-disciplinary family meet or Palliative Care meeting due by:  01/23/16    Critical Care Time devoted to patient care services described in this note is45 Minutes. This critical care time does not reflect procedure time, or teaching time or supervisory time of PA/NP/Med student/Med Resident etc but could involve care discussion time.     Levy Pupa, MD, PhD 01/17/2016, 10:05 AM  Pulmonary and Critical Care [phone] or if no answer [phone]

## 2016-01-17 NOTE — Progress Notes (Signed)
Wasted 35cc of versed in the sink. Witnessed byTara Tomilinson,RN

## 2016-01-17 NOTE — Progress Notes (Signed)
CRITICAL VALUE ALERT  Critical value received:  Troponin 1.22  Date of notification:  01/17/2016  Time of notification:  0935  Critical value read back:Yes.    Nurse who received alert:  Kavin Leech  MD notified (1st page):  MD [place]  Time of first page:  0950  MD [place] responded at [phone] and informed of Troponin value.

## 2016-01-18 ENCOUNTER — Inpatient Hospital Stay (HOSPITAL_COMMUNITY): Payer: Medicare Other

## 2016-01-18 DIAGNOSIS — I469 Cardiac arrest, cause unspecified: Secondary | ICD-10-CM

## 2016-01-18 DIAGNOSIS — J9600 Acute respiratory failure, unspecified whether with hypoxia or hypercapnia: Secondary | ICD-10-CM | POA: Insufficient documentation

## 2016-01-18 LAB — BASIC METABOLIC PANEL
Anion gap: 11 (ref 5–15)
Anion gap: 12 (ref 5–15)
Anion gap: 12 (ref 5–15)
Anion gap: 8 (ref 5–15)
Anion gap: 9 (ref 5–15)
BUN: 14 mg/dL (ref 6–20)
BUN: 14 mg/dL (ref 6–20)
BUN: 16 mg/dL (ref 6–20)
BUN: 15 mg/dL (ref 6–20)
BUN: 15 mg/dL (ref 6–20)
CALCIUM: 8.3 mg/dL — AB (ref 8.9–10.3)
CALCIUM: 8.3 mg/dL — AB (ref 8.9–10.3)
CALCIUM: 8.4 mg/dL — AB (ref 8.9–10.3)
CALCIUM: 8 mg/dL — AB (ref 8.9–10.3)
CALCIUM: 8.3 mg/dL — AB (ref 8.9–10.3)
CHLORIDE: 114 mmol/L — AB (ref 101–111)
CHLORIDE: 114 mmol/L — AB (ref 101–111)
CHLORIDE: 113 mmol/L — AB (ref 101–111)
CHLORIDE: 114 mmol/L — AB (ref 101–111)
CO2: 20 mmol/L — ABNORMAL LOW (ref 22–32)
CO2: 19 mmol/L — AB (ref 22–32)
CO2: 20 mmol/L — ABNORMAL LOW (ref 22–32)
CO2: 19 mmol/L — AB (ref 22–32)
CO2: 19 mmol/L — ABNORMAL LOW (ref 22–32)
CREATININE: 1.23 mg/dL (ref 0.61–1.24)
CREATININE: 1.28 mg/dL — AB (ref 0.61–1.24)
CREATININE: 1.37 mg/dL — AB (ref 0.61–1.24)
CREATININE: 1.2 mg/dL (ref 0.61–1.24)
CREATININE: 1.21 mg/dL (ref 0.61–1.24)
Chloride: 114 mmol/L — ABNORMAL HIGH (ref 101–111)
GFR calc Af Amer: >60 mL/min (ref >60)
GFR calc Af Amer: >60 mL/min (ref >60)
GFR calc non Af Amer: >60 mL/min (ref >60)
GFR calc non Af Amer: >60 mL/min (ref >60)
GFR calc non Af Amer: >60 mL/min (ref >60)
Glucose, Bld: 101 mg/dL — ABNORMAL HIGH (ref 65–99)
Glucose, Bld: 103 mg/dL — ABNORMAL HIGH (ref 65–99)
Glucose, Bld: 80 mg/dL (ref 65–99)
Glucose, Bld: 90 mg/dL (ref 65–99)
Glucose, Bld: 99 mg/dL (ref 65–99)
Potassium: 3.1 mmol/L — ABNORMAL LOW (ref 3.5–5.1)
Potassium: 3.5 mmol/L (ref 3.5–5.1)
Potassium: 3.9 mmol/L (ref 3.5–5.1)
Potassium: 4 mmol/L (ref 3.5–5.1)
Potassium: 4.1 mmol/L (ref 3.5–5.1)
SODIUM: 142 mmol/L (ref 135–145)
SODIUM: 143 mmol/L (ref 135–145)
SODIUM: 145 mmol/L (ref 135–145)
SODIUM: 144 mmol/L (ref 135–145)
Sodium: 144 mmol/L (ref 135–145)

## 2016-01-18 LAB — CBC
HEMATOCRIT: 45.9 % (ref 39.0–52.0)
Hemoglobin: 15.5 g/dL (ref 13.0–17.0)
MCH: 32.7 pg (ref 26.0–34.0)
MCHC: 33.8 g/dL (ref 30.0–36.0)
MCV: 96.8 fL (ref 78.0–100.0)
PLATELETS: 242 10*3/uL (ref 150–400)
RBC: 4.74 MIL/uL (ref 4.22–5.81)
RDW: 14.2 % (ref 11.5–15.5)
WBC: 17.8 10*3/uL — AB (ref 4.0–10.5)

## 2016-01-18 LAB — GLUCOSE, CAPILLARY
GLUCOSE-CAPILLARY: 76 mg/dL (ref 65–99)
GLUCOSE-CAPILLARY: 88 mg/dL (ref 65–99)
GLUCOSE-CAPILLARY: 88 mg/dL (ref 65–99)
GLUCOSE-CAPILLARY: 91 mg/dL (ref 65–99)
Glucose-Capillary: 78 mg/dL (ref 65–99)

## 2016-01-18 LAB — PROCALCITONIN: Procalcitonin: 9.78 ng/mL

## 2016-01-18 MED ORDER — SODIUM CHLORIDE 0.9 % IV SOLN
3.0000 g | Freq: Four times a day (QID) | INTRAVENOUS | Status: DC
  Administered 2016-01-18 – 2016-01-20 (×7): 3 g via INTRAVENOUS
  Filled 2016-01-18 (×10): qty 3

## 2016-01-18 NOTE — Progress Notes (Signed)
SUBJECTIVE: intubated, sedated. Being cooled. On nimbex.  BP 98/76 mmHg  Pulse 76  Temp(Src) 93.7 F (34.3 C) (Core (Comment))  Resp 14  Ht  (1.727 m)  Wt 98 kg (216 lb 0.8 oz)  BMI 32.86 kg/m2  SpO2 99%  Intake/Output Summary (Last 24 hours) at 01/18/16 6578 Last data filed at 01/18/16 0600  Gross per 24 hour  Intake 1778.63 ml  Output   2230 ml  Net -451.37 ml    PHYSICAL EXAM General: overweight male, sedated, intubated.  Neck: has R IJ CV line.  Lungs: coarse breathe sounds, limited exam anteriorly.  Heart: RRR with no murmurs noted. Abdomen: Bowel sounds are present. Soft, non-tender.  Extremities: No lower extremity edema.   LABS: Basic Metabolic Panel:  Recent Labs  46/96/29 0025  01/17/16 0408  01/18/16 01/18/16 0355  NA 142  < >  --   < > 144 144  K 3.4*  < >  --   < > 3.1* 3.5  CL 105  < >  --   < > 113* 114*  CO2 20*  --   --   < > 19* 19*  GLUCOSE 212*  < >  --   < > 103* 99  BUN 14  < >  --   < > 15 15  CREATININE 1.60*  < >  --   < > 1.20 1.21  CALCIUM 7.8*  --   --   < > 8.0* 8.3*  MG 1.6*  --  1.7  --   --   --   PHOS  --   --  3.9  --   --   --   < > = values in this interval not displayed. CBC:  Recent Labs  January 17, 2016 1950  01/17/16 0830 01/17/16 1007 01/18/16 0355  WBC 17.9*  < > 32.9*  --  17.8*  NEUTROABS 9.9*  --   --   --   --   HGB 14.8  < > 17.2* 18.0* 15.5  HCT 47.3  < > 49.7 53.0* 45.9  MCV 105.3*  < > 97.8  --  96.8  PLT 258  < > 283  --  242  < > = values in this interval not displayed. Cardiac Enzymes:  Recent Labs  01/17/16 0334 01/17/16 0840 01/17/16 1430  TROPONINI 0.63* 1.22* 1.09*   Fasting Lipid Panel: No results for input(s): CHOL, HDL, LDLCALC, TRIG, CHOLHDL, LDLDIRECT in the last 72 hours.  Current Meds: . antiseptic oral rinse  7 mL Mouth Rinse 10 times per day  . artificial tears  1 application Both Eyes 3 times per day  . chlorhexidine gluconate  15 mL Mouth Rinse BID  . famotidine  (PEPCID) IV  20 mg Intravenous Q12H  . heparin  5,000 Units Subcutaneous 3 times per day  . insulin aspart  2-6 Units Subcutaneous 6 times per day  . sodium chloride flush  10-40 mL Intracatheter Q12H     ASSESSMENT AND PLAN:  Active Problems:   Cardiac arrest (HCC)   Encounter for central line placement   Encephalopathy acute   40 yo male presented s/p witnessed cardiac arrest with 20 mins of ACLS  Cardiac arrest - s/p 20 mins ACLS, was in PEA initially. EKG showed inferolateral ST depression but repeat EKG shows resolution of that. Troponin peaked at 1.22, likely 2/2 to CPR. Doubt cardiac etiology. This is most likely 2/2 to drug overdose (opiates vs benzo)  causing resp failure leading to cardiac arrest. No prior cardiac hx. Currently on therapeutic hypothermia protocol. CT head showed hypoxic brain injury. Prolonged QT could be from cooling.  - continue to trend EKG. Echo pending. We will follow with you. Needs psych consult if patient's mental status improves.  We will follow with you.   Acute hypoxic resp failure - could be the primary cause of cardiac arrest vs the result of it.  - trending CXR to see if any infiltrate. Not on abx. On full vent support. Management per CCM.   AKI - crt improved to back to baseline. Wonder if this is 2/2 to cocaine renal vasospasm. Good UOP.   AMS - CT showed hypoxic brain injury. Poor prognosis, re-assess when off cooling and paralytic.   Lactic acidosis - improved.   Hyacinth Meeker  2/8/20178:08 AM  Patient seen and examined and history reviewed. Agree with above findings and plan. Patient still sedated and intubated. Paralyzed. No significant arrhythmias. Troponin only minimally elevated c/w arrest and CPR. Awaiting Echo. Awaiting Neuro evaluation once rewarmed.  Deyon Chizek [place], MDFACC 01/18/2016 9:53 AM

## 2016-01-18 NOTE — Progress Notes (Signed)
RN increased FIO2 to 100% due to decreasing sat.

## 2016-01-18 NOTE — Progress Notes (Signed)
PULMONARY / CRITICAL CARE MEDICINE   Name: Jonathan Perez MRN: [id number] DOB: 01/29/1976    ADMISSION DATE:  01/25/2016 CONSULTATION DATE:  01/15/2016  REFERRING MD:  Marijean Niemann, MD  CHIEF COMPLAINT: Cardiac Arrest  HISTORY OF PRESENT ILLNESS:   Jonathan Perez is a 40 y.o. male with PMH significant for HTN, bipolar, chronic pain, multiple prior suicide attempts who presented to the ED by EMS after witnessed cardiac arrest. Patient checked himself into a hotel and about 5 minutes later walked out and collapsed. Patient received approxiametly 20 minutes of CPR with ROSC. Initially with PEA. Was given Narcan with no response and 5 epi. I/O placed in field. In ED patient was intubated and started on NE. Patient with known suicidal attempts by overdose with last attempt 10/2015. Unknown substance currently.    SUBJECTIVE:  Unresponsive on full vent support on nmb   VITAL SIGNS: BP 98/76 mmHg  Pulse 76  Temp(Src) 94.3 F (34.6 C) (Core (Comment))  Resp 18  Ht  (1.727 m)  Wt 216 lb 0.8 oz (98 kg)  BMI 32.86 kg/m2  SpO2 99%  HEMODYNAMICS:    VENTILATOR SETTINGS: Vent Mode:  [-] PRVC FiO2 (%):  [40 %-50 %] 40 % Set Rate:  [18 bmp] 18 bmp Vt Set:  [590 mL] 590 mL PEEP:  [5 cmH20] 5 cmH20 Plateau Pressure:  [16 cmH20-21 cmH20] 21 cmH20  INTAKE / OUTPUT: I/O last 3 completed shifts: In: 4028.7 [I.V.:3728.7; IV Piggyback:300] Out: 6485 [Urine:6485]  PHYSICAL EXAMINATION: General:  Unresponsive, intubated, on nmb Neuro: no corneal reflex, absent reflexes . On nmb HEENT: dilated pupils bilaterally Cardiovascular: Tachycardia. Regular rhythm  Lungs: CTAB  Abdomen:  Soft, no masses  Musculoskeletal:   Skin:  Warm and dry, intact.   LABS:  BMET  Recent Labs Lab 01/18/16 01/18/16 0355 01/18/16 0820  NA 144 144 143  K 3.1* 3.5 4.1  CL 113* 114* 114*  CO2 19* 19* 20*  BUN CREATININE 1.20 1.21 1.23  GLUCOSE 103* 99 101*    Electrolytes  Recent  Labs Lab 01/17/16 0025 01/17/16 0408  01/18/16 01/18/16 0355 01/18/16 0820  CALCIUM 7.8*  --   < > 8.0* 8.3* 8.3*  MG 1.6* 1.7  --   --   --   --   PHOS  --  3.9  --   --   --   --   < > = values in this interval not displayed.  CBC  Recent Labs Lab 01/17/16 0445  01/17/16 0830 01/17/16 1007 01/18/16 0355  WBC 33.8*  --  32.9*  --  17.8*  HGB 17.4*  < > 17.2* 18.0* 15.5  HCT 51.9  < > 49.7 53.0* 45.9  PLT 265  --  283  --  242  < > = values in this interval not displayed.  Coag's  Recent Labs Lab 01/17/16 0025 01/17/16 0830  APTT 30 30  INR 1.10 1.12    Sepsis Markers  Recent Labs Lab 01/30/2016 2001 01/17/16 0025 01/17/16 0341 01/17/16 0408 01/18/16 0355  LATICACIDVEN 13.61* 2.7* 2.7*  --   --   PROCALCITON  --  2.58  --  5.66 9.78    ABG  Recent Labs Lab 01/17/16 0202 01/17/16 0414  PHART 7.217* 7.326*  PCO2ART 47.5* 37.4  PO2ART 202.0* 78.0*    Liver Enzymes  Recent Labs Lab 01/31/2016 1950  AST 133*  ALT 126*  ALKPHOS 95  BILITOT 0.4  ALBUMIN  3.2*    Cardiac Enzymes  Recent Labs Lab 01/17/16 0334 01/17/16 0840 01/17/16 1430  TROPONINI 0.63* 1.22* 1.09*    Glucose  Recent Labs Lab 01/17/16 1947 01/17/16 2156 01/17/16 2220 01/18/16 0007 01/18/16 0353 01/18/16 0813  GLUCAP 95 58* 152* 91 88 88    Imaging Dg Chest Port 1 View  01/18/2016  CLINICAL DATA:  Acute respiratory failure EXAM: PORTABLE CHEST 1 VIEW COMPARISON:  01/17/2016 FINDINGS: Endotracheal tube in satisfactory position distal trachea. Catheter tip obscured by Harrington rods. Right jugular catheter tip turned to the left and likely is in the azygos vein. This is unchanged. NG coiled in the stomach. Bibasilar atelectasis unchanged from the prior study. IMPRESSION: Endotracheal tube distal trachea with the carina difficult to visualize. Central venous catheter tip in the azygos vein Bibasilar atelectasis is unchanged. Electronically Signed   By: Marlan Palau M.D.    On: 01/18/2016 07:18    STUDIES:  CT head 2/6>>> loss of gray/white interface, concern for ischemic injury and edema  CULTURES: Blood 2/6>> Urine 2/6>>neg  ANTIBIOTICS: N/A  SIGNIFICANT EVENTS:   LINES/TUBES: ETT PIVs  DISCUSSION: 40 y.o. male with multiple prior suicide attempts, resp and then cardiac arrest. Intubated and supported w therapeutic hypothermia. Head Ct concerning for ischemic injury  ASSESSMENT / PLAN:  PULMONARY A: Acute Hypoxic Respiratory failure 2/2 Cardiac Arrest Sleep Apnea At risk for aspiration  P:   Full vent support Hold SBTs until off paralytics and rewarmed Albuterol prn Given increase in secretions and sputum and Bibasilar infiltrates will start abx on 2/8  CARDIOVASCULAR A:  Cardiac arrest - PEA due to presumed overdose/suicide attempt (multiple prior attempts) with 20 minutes of ACLS prior to ROSC H/o HTN + cocaine P:  Telemtry  Norepi for goal MAP while cooled, MAP >80 Trend troponin Follow CVPs Echo pending Lactate has cleared 2/7 Cardiology following   RENAL Lab Results  Component Value Date   CREATININE 1.23 01/18/2016   CREATININE 1.21 01/18/2016   CREATININE 1.20 01/18/2016    A:   Acute on Chronic Kidney Disease - Cr 2.09 on admission; resolved Hypokalemia - 3.3 on admission, resolved Hypocalcemia Anion gap metabolic acidosis - Lactic acid 13.6, AG 25; resolved  P:   Monitor electrolytes   GASTROINTESTINAL A:   No acute issues  P:   PPI SCDs NPO IVF Pepcid   HEMATOLOGIC A:   No acute issues  P:  Follow CBC, coags   INFECTIOUS A:   No acute issues At risk for aspiration Leukocytosis - but in setting of ACLS  P:   Pan culture, follow Trend WBC Start unasyn 2/8 for suspected aspiration PNA  ENDOCRINE CBG (last 3)   Recent Labs  01/18/16 0007 01/18/16 0353 01/18/16 0813  GLUCAP 91 88 88     A:   No acute issues   Anticipate hypoglycemia due to cooling  P:    Monitor Follow CBG  NEUROLOGIC A:   Acute encephalopathy in setting of cardiac arrest Probable Anoxic brain injury Presumed suicide attempt by overdose (multiple prior attempts including IVC to [facility] health in Oct) - unknown substance - did receive narcan by EMS with no response. ASA and APAP levels negative Chronic pain on methadone Cocaine positive P:   RASS goal: -5 Therapeutic cooling, will rewarm this pm.  Sedation with fentanyl Assess for wake up when warmed.  EEG results pending Consider neuro consult for prognosis as we progress Suicide precautions once extubated Psych consult if return to baseline and  awake   FAMILY  - Updates: RB spoke with family at bedside 2/7  - Inter-disciplinary family meet or Palliative Care meeting due by:  01/23/16    Brett Canales Minor ACNP [facility] P[facility] Pager [phone] till 3 pm If no answer page [phone] 01/18/2016, 9:13 AM  Attending Note:  I have examined patient, reviewed labs, studies and notes. I have discussed the case with S Minor, and I agree with the data and plans as amended above. S/p arrest, presumed resp arrest due to toxic ingestion. Has almost completed rewarming. On my eval he remains sedated. He is requiring increased Fio2 and is having green resp secretions that are new. Will plan to decrease sedation, assess MS. Will start unasyn for presumed aspiration PNA. Assess prognosis based on neuro status when sedatation lifted.  Independent critical care time is 35 minutes.   Levy Pupa, MD, PhD 01/18/2016, 1:59 PM [facility] [phone] or if no answer [phone]

## 2016-01-18 NOTE — Progress Notes (Signed)
Pharmacy Antibiotic Note  Jonathan Perez is a 40 y.o. male admitted on 2016-01-27 with cardiac arrest and now with concern of aspiration pneumonia.  Pharmacy has been consulted for Unasyn dosing. He is noted s/p hypothermia protocol  -WBC= 17.8, SCr= 1.28, CrCl ~ 80  Plan: -Unasyn 3gm IV q6h -Will follow renal function, cultures and clinical progress   Height:  (172.7 cm) (per record) Weight: 216 lb 0.8 oz (98 kg) IBW/kg (Calculated) : 68.4  Temp (24hrs), Avg:92.6 F (33.7 C), Min:90.5 F (32.5 C), Max:97 F (36.1 C)   Recent Labs Lab 01/27/2016 1950 01-27-16 2001  01/17/16 0025  01/17/16 0341 01/17/16 0445  01/17/16 0830  01/17/16 2003 01/18/16 01/18/16 0355 01/18/16 0820 01/18/16 1230  WBC 17.9*  --   --   --   --   --  33.8*  --  32.9*  --   --   --  17.8*  --   --   CREATININE 2.09*  --   < > 1.60*  < >  --   --   < >  --   < > 1.24 1.20 1.21 1.23 1.28*  LATICACIDVEN  --  13.61*  --  2.7*  --  2.7*  --   --   --   --   --   --   --   --   --   < > = values in this interval not displayed.  Estimated Creatinine Clearance: 86.2 mL/min (by C-G formula based on Cr of 1.28).    Allergies  Allergen Reactions  . Reglan [Metoclopramide] Itching    Agitation   . Toradol [Ketorolac Tromethamine] Itching and Rash    Antimicrobials this admission: 2/9 unasyn>>  Thank you for allowing pharmacy to be a part of this patient's care.  Atharva, Mirsky 01/18/2016 3:02 PM

## 2016-01-18 NOTE — Progress Notes (Signed)
Potassium at 0200 is 3.1.  Since hyperkalemia can happen with rewarming, replaced potassium is going to be held for now since rewarming is about to be started and will be rechecked at 0400.  Discussed with MD.  Will continue to monitor.

## 2016-01-18 NOTE — Progress Notes (Signed)
Patients Mechanical ventilation check has been done. Peak 33 mean 13 Plat 27. Patient did have copious amount of tan thick secretions. Patient is stable and not in any distress. RT will continue to monitor.

## 2016-01-18 NOTE — Progress Notes (Signed)
  Echocardiogram 2D Echocardiogram has been performed.  Tye Savoy 01/18/2016, 2:00 PM

## 2016-01-19 ENCOUNTER — Inpatient Hospital Stay (HOSPITAL_COMMUNITY): Payer: Medicare Other

## 2016-01-19 LAB — BLOOD GAS, ARTERIAL
Acid-base deficit: 7.9 mmol/L — ABNORMAL HIGH (ref 0.0–2.0)
Bicarbonate: 16.9 mEq/L — ABNORMAL LOW (ref 20.0–24.0)
Drawn by: 347621
FIO2: 0.6
LHR: 18 {breaths}/min
O2 SAT: 95.5 %
PATIENT TEMPERATURE: 99.5
PCO2 ART: 33.9 mmHg — AB (ref 35.0–45.0)
PEEP: 5 cmH2O
PH ART: 7.322 — AB (ref 7.350–7.450)
TCO2: 17.9 mmol/L (ref 0–100)
VT: 590 mL
pO2, Arterial: 95.3 mmHg (ref 80.0–100.0)

## 2016-01-19 LAB — GLUCOSE, CAPILLARY
GLUCOSE-CAPILLARY: 97 mg/dL (ref 65–99)
GLUCOSE-CAPILLARY: 94 mg/dL (ref 65–99)
GLUCOSE-CAPILLARY: 93 mg/dL (ref 65–99)
Glucose-Capillary: 106 mg/dL — ABNORMAL HIGH (ref 65–99)
Glucose-Capillary: 80 mg/dL (ref 65–99)
Glucose-Capillary: 85 mg/dL (ref 65–99)
Glucose-Capillary: 89 mg/dL (ref 65–99)
Glucose-Capillary: 93 mg/dL (ref 65–99)

## 2016-01-19 LAB — CBC
HEMATOCRIT: 47.4 % (ref 39.0–52.0)
Hemoglobin: 15.7 g/dL (ref 13.0–17.0)
MCH: 32.6 pg (ref 26.0–34.0)
MCHC: 33.1 g/dL (ref 30.0–36.0)
MCV: 98.5 fL (ref 78.0–100.0)
Platelets: 179 10*3/uL (ref 150–400)
RBC: 4.81 MIL/uL (ref 4.22–5.81)
RDW: 14.8 % (ref 11.5–15.5)
WBC: 3.7 10*3/uL — ABNORMAL LOW (ref 4.0–10.5)

## 2016-01-19 LAB — BASIC METABOLIC PANEL
ANION GAP: 9 (ref 5–15)
Anion gap: 14 (ref 5–15)
BUN: 18 mg/dL (ref 6–20)
BUN: 23 mg/dL — ABNORMAL HIGH (ref 6–20)
CALCIUM: 7.7 mg/dL — AB (ref 8.9–10.3)
CO2: 17 mmol/L — AB (ref 22–32)
CO2: 22 mmol/L (ref 22–32)
CREATININE: 1.61 mg/dL — AB (ref 0.61–1.24)
Calcium: 7.9 mg/dL — ABNORMAL LOW (ref 8.9–10.3)
Chloride: 114 mmol/L — ABNORMAL HIGH (ref 101–111)
Chloride: 114 mmol/L — ABNORMAL HIGH (ref 101–111)
Creatinine, Ser: 1.48 mg/dL — ABNORMAL HIGH (ref 0.61–1.24)
GFR calc Af Amer: 60 mL/min — ABNORMAL LOW (ref >60)
GFR calc Af Amer: >60 mL/min (ref >60)
GFR calc non Af Amer: 52 mL/min — ABNORMAL LOW (ref >60)
GFR calc non Af Amer: 57 mL/min — ABNORMAL LOW (ref >60)
GLUCOSE: 105 mg/dL — AB (ref 65–99)
Glucose, Bld: 96 mg/dL (ref 65–99)
POTASSIUM: 3.8 mmol/L (ref 3.5–5.1)
Potassium: 2.9 mmol/L — ABNORMAL LOW (ref 3.5–5.1)
SODIUM: 145 mmol/L (ref 135–145)
Sodium: 145 mmol/L (ref 135–145)

## 2016-01-19 LAB — TRIGLYCERIDES: Triglycerides: 190 mg/dL — ABNORMAL HIGH (ref <150)

## 2016-01-19 LAB — MAGNESIUM: MAGNESIUM: 1.4 mg/dL — AB (ref 1.7–2.4)

## 2016-01-19 MED ORDER — MIDAZOLAM HCL 2 MG/2ML IJ SOLN: 2.0000 mg | INTRAMUSCULAR | Status: DC | PRN

## 2016-01-19 MED ORDER — POTASSIUM CHLORIDE 10 MEQ/50ML IV SOLN
10.0000 meq | INTRAVENOUS | Status: AC
Start: 2016-01-19 — End: 2016-01-19
  Administered 2016-01-19 (×2): 10 meq via INTRAVENOUS
  Filled 2016-01-19 (×2): qty 50

## 2016-01-19 MED ORDER — MAGNESIUM SULFATE 2 GM/50ML IV SOLN
2.0000 g | Freq: Once | INTRAVENOUS | Status: AC
  Administered 2016-01-19: 2 g via INTRAVENOUS
  Filled 2016-01-19: qty 50

## 2016-01-19 MED ORDER — FENTANYL CITRATE (PF) 100 MCG/2ML IJ SOLN
100.0000 ug | INTRAMUSCULAR | Status: DC | PRN
Start: 2016-01-19 — End: 2016-01-20

## 2016-01-19 MED ORDER — POTASSIUM CHLORIDE 10 MEQ/50ML IV SOLN
10.0000 meq | INTRAVENOUS | Status: AC
Start: 2016-01-19 — End: 2016-01-19
  Administered 2016-01-19 (×4): 10 meq via INTRAVENOUS
  Filled 2016-01-19 (×4): qty 50

## 2016-01-19 MED ORDER — FENTANYL CITRATE (PF) 100 MCG/2ML IJ SOLN: 100.0000 ug | INTRAMUSCULAR | Status: DC | PRN

## 2016-01-19 NOTE — Progress Notes (Signed)
eLink Physician-Brief Progress Note Patient Name: Jonathan Perez DOB: [birth year] MRN: [id number]   Date of Service  01/19/2016  HPI/Events of Note  K+ = 3.8, Mg++ = 1.4 and Creatinine = 1.48.  eICU Interventions  Will replace K+ and Mg++.     Intervention Category Intermediate Interventions: Electrolyte abnormality - evaluation and management  Sommer,Steven Eugene 01/19/2016, 7:15 PM

## 2016-01-19 NOTE — Progress Notes (Signed)
eLink Physician-Brief Progress Note Patient Name: Jonathan Perez DOB: 19-Nov-1976 MRN: [id number]   Date of Service  01/19/2016  HPI/Events of Note  K+ = 2.9 this AM >> replaced.   eICU Interventions  Will recheck BMP and Mg++ level now.      Intervention Category Intermediate Interventions: Electrolyte abnormality - evaluation and management Minor Interventions: Clinical assessment - ordering diagnostic tests  Hazely Sealey Dennard Nip 01/19/2016, 4:17 PM

## 2016-01-19 NOTE — Progress Notes (Signed)
PULMONARY / CRITICAL CARE MEDICINE   Name: Jonathan Perez MRN: [id number] DOB: Jul 26, 1976    ADMISSION DATE:  01/25/2016 CONSULTATION DATE:  01/27/2016  REFERRING MD:  Marijean Niemann, MD  CHIEF COMPLAINT: Cardiac Arrest  HISTORY OF PRESENT ILLNESS:   Jonathan Perez is a 40 y.o. male with PMH significant for HTN, bipolar, chronic pain, multiple prior suicide attempts who presented to the ED by EMS after witnessed cardiac arrest. Patient checked himself into a hotel and about 5 minutes later walked out and collapsed. Patient received approxiametly 20 minutes of CPR with ROSC. Initially with PEA. Was given Narcan with no response and 5 epi. I/O placed in field. In ED patient was intubated and started on NE. Patient with known suicidal attempts by overdose with last attempt 10/2015. Unknown substance currently.    SUBJECTIVE:  Off all sedation as of early am 2/9.  Unresponsive    VITAL SIGNS: BP 124/94 mmHg  Pulse 110  Temp(Src) 99 F (37.2 C) (Core (Comment))  Resp 26  Ht  (1.727 m)  Wt 99 kg (218 lb 4.1 oz)  BMI 33.19 kg/m2  SpO2 100%  HEMODYNAMICS:    VENTILATOR SETTINGS: Vent Mode:  [-] PRVC FiO2 (%):  [40 %-100 %] 60 % Set Rate:  [18 bmp] 18 bmp Vt Set:  [590 mL] 590 mL PEEP:  [5 cmH20] 5 cmH20 Plateau Pressure:  [16 cmH20-25 cmH20] 25 cmH20  INTAKE / OUTPUT: I/O last 3 completed shifts: In: 2585.4 [I.V.:2065.4; NG/GT:20; IV Piggyback:500] Out: 1635 [Urine:1635]  PHYSICAL EXAMINATION: General:  Unresponsive, intubated Neuro: no corneal reflex, absent reflexes, negative doll's eye HEENT: 1-43mm pupils Cardiovascular: Tachycardia. Regular rhythm  Lungs: CTAB  Abdomen:  Soft, no masses  Musculoskeletal:   Skin:  Warm and dry, intact.   LABS:  BMET  Recent Labs Lab 01/18/16 1230 01/18/16 1650 01/19/16 0420  NA 142 145 145  K 4.0 3.9 2.9*  CL 114* 114* 114*  CO2 20* 19* 17*  BUN CREATININE 1.28* 1.37* 1.61*  GLUCOSE 90 80 105*     Electrolytes  Recent Labs Lab 01/17/16 0025 01/17/16 0408  01/18/16 1230 01/18/16 1650 01/19/16 0420  CALCIUM 7.8*  --   < > 8.3* 8.4* 7.7*  MG 1.6* 1.7  --   --   --   --   PHOS  --  3.9  --   --   --   --   < > = values in this interval not displayed.  CBC  Recent Labs Lab 01/17/16 0830 01/17/16 1007 01/18/16 0355 01/19/16 0420  WBC 32.9*  --  17.8* 3.7*  HGB 17.2* 18.0* 15.5 15.7  HCT 49.7 53.0* 45.9 47.4  PLT 283  --  242 179    Coag's  Recent Labs Lab 01/17/16 0025 01/17/16 0830  APTT 30 30  INR 1.10 1.12    Sepsis Markers  Recent Labs Lab 02/06/2016 2001 01/17/16 0025 01/17/16 0341 01/17/16 0408 01/18/16 0355  LATICACIDVEN 13.61* 2.7* 2.7*  --   --   PROCALCITON  --  2.58  --  5.66 9.78    ABG  Recent Labs Lab 01/17/16 0202 01/17/16 0414 01/19/16 0402  PHART 7.217* 7.326* 7.322*  PCO2ART 47.5* 37.4 33.9*  PO2ART 202.0* 78.0* 95.3    Liver Enzymes  Recent Labs Lab 02/01/2016 1950  AST 133*  ALT 126*  ALKPHOS 95  BILITOT 0.4  ALBUMIN 3.2*    Cardiac Enzymes  Recent Labs Lab 01/17/16 0334  01/17/16 0840 01/17/16 1430  TROPONINI 0.63* 1.22* 1.09*    Glucose  Recent Labs Lab 01/18/16 1652 01/18/16 2004 01/19/16 0105 01/19/16 0423 01/19/16 0818 01/19/16 1145  GLUCAP 76 93 106* 97 85 94    Imaging Dg Chest Port 1 View  01/19/2016  CLINICAL DATA:  Respiratory failure. EXAM: PORTABLE CHEST 1 VIEW COMPARISON:  01/18/2016. FINDINGS: Endotracheal tube, NG tube, right IJ line stable position. Heart size stable. New onset of prominent bilateral lower lobe pulmonary infiltrates, particular prominent the right lower lobe. Basilar atelectasis No pleural effusion or pneumothorax. Heart size normal. Pulmonary vascularity normal. Prior thoracolumbar spine fusion. IMPRESSION: 1.  Lines and tubes in stable position. 2. Interim appearance of dense bilateral lower lobe infiltrates, particular prominent on the right. Basilar  atelectasis. Electronically Signed   By: Maisie Fus  Register   On: 01/19/2016 07:11    STUDIES:  CT head 2/6>>> loss of gray/white interface, concern for ischemic injury and edema  CULTURES: Blood 2/6>> Urine 2/6>>neg  ANTIBIOTICS: N/A  SIGNIFICANT EVENTS:   LINES/TUBES: ETT PIVs  DISCUSSION: 40 y.o. male with multiple prior suicide attempts, resp and then cardiac arrest. Intubated and supported w therapeutic hypothermia. Head Ct concerning for ischemic injury  ASSESSMENT / PLAN:  PULMONARY A: Acute Hypoxic Respiratory failure 2/2 Cardiac Arrest Sleep Apnea At risk for aspiration  P:   Full vent support Hold SBTs based on neuro status Albuterol prn Given increase in secretions and sputum and Bibasilar infiltrates started abx on 2/8  CARDIOVASCULAR A:  Cardiac arrest - PEA due to presumed overdose/suicide attempt (multiple prior attempts) with 20 minutes of ACLS prior to ROSC. TTE with intact LV fxn H/o HTN + cocaine P:  Telemtry  Norepi off Lactate has cleared 2/7 Appreciate Cardiology assist  RENAL Lab Results  Component Value Date   CREATININE 1.61* 01/19/2016   CREATININE 1.37* 01/18/2016   CREATININE 1.28* 01/18/2016    A:   Acute on Chronic Kidney Disease - Cr 2.09 on admission Hypokalemia - 3.3 on admission, resolved Hypocalcemia Anion gap metabolic acidosis - Lactic acid 13.6, AG 25; resolved  P:   Monitor electrolytes and UOP   GASTROINTESTINAL A:   No acute issues  P:   PPI SCDs NPO IVF Pepcid   HEMATOLOGIC A:   No acute issues  P:  Follow CBC, coags   INFECTIOUS A:   No acute issues Suspected aspiration PNA Leukocytosis - but in setting of ACLS  P:   Pan culture, follow Trend WBC Started unasyn 2/8 for suspected aspiration PNA  ENDOCRINE CBG (last 3)   Recent Labs  01/19/16 0423 01/19/16 0818 01/19/16 1145  GLUCAP 97 85 94     A:   No acute issues   Anticipate hypoglycemia due to cooling  P:    Monitor Follow CBG  NEUROLOGIC A:   Acute encephalopathy in setting of cardiac arrest Probable Anoxic brain injury Presumed suicide attempt by overdose (multiple prior attempts including IVC to [facility] in Oct) - unknown substance - did receive narcan by EMS with no response. ASA and APAP levels negative Chronic pain on methadone Cocaine positive P:   RASS goal: -1 Therapeutic cooling completed Change sedation to pushes prn  Repeat CT head on 2/9 to assess for signs progressive anoxia Consider neuro consult for prognosis as we progress Suicide precautions once extubated Psych consult if return to baseline and awake   FAMILY  - Updates: RB spoke with family at bedside 2/7 and 2/8  - Inter-disciplinary family  meet or Palliative Care meeting due by:  01/23/16   Independent critical care time is 35 minutes.   Levy Pupa, MD, PhD 01/19/2016, 12:06 PM [facility] [phone] or if no answer [phone]

## 2016-01-19 NOTE — Care Management Important Message (Signed)
Important Message  Patient Details  Name: BRIAR WITHERSPOON MRN: [id number] Date of Birth: 1976-06-16   Medicare Important Message Given:  Yes    Bernadette Hoit 01/19/2016, 8:02 AM

## 2016-01-19 NOTE — Progress Notes (Signed)
SUBJECTIVE: intubated, sedated. Off hypothermia now normothermic.   BP 110/81 mmHg  Pulse 109  Temp(Src) 98.4 F (36.9 C) (Core (Comment))  Resp 20  Ht  (1.727 m)  Wt 99 kg (218 lb 4.1 oz)  BMI 33.19 kg/m2  SpO2 100%  Intake/Output Summary (Last 24 hours) at 01/19/16 0745 Last data filed at 01/19/16 0700  Gross per 24 hour  Intake 1584.59 ml  Output    840 ml  Net 744.59 ml    PHYSICAL EXAM General: overweight male, sedated, intubated.  Neck: has R IJ CV line.  Lungs: coarse breathe sounds, limited exam anteriorly.  Heart: RRR with no murmurs noted. Abdomen: Bowel sounds are present. Soft, non-tender.  Extremities: No lower extremity edema.   LABS: Basic Metabolic Panel:  Recent Labs  16/10/96 0025  01/17/16 0408  01/18/16 1650 01/19/16 0420  NA 142  < >  --   < > 145 145  K 3.4*  < >  --   < > 3.9 2.9*  CL 105  < >  --   < > 114* 114*  CO2 20*  --   --   < > 19* 17*  GLUCOSE 212*  < >  --   < > 80 105*  BUN 14  < >  --   < > 14 18  CREATININE 1.60*  < >  --   < > 1.37* 1.61*  CALCIUM 7.8*  --   --   < > 8.4* 7.7*  MG 1.6*  --  1.7  --   --   --   PHOS  --   --  3.9  --   --   --   < > = values in this interval not displayed. CBC:  Recent Labs  01/20/2016 1950  01/18/16 0355 01/19/16 0420  WBC 17.9*  < > 17.8* 3.7*  NEUTROABS 9.9*  --   --   --   HGB 14.8  < > 15.5 15.7  HCT 47.3  < > 45.9 47.4  MCV 105.3*  < > 96.8 98.5  PLT 258  < > 242 179  < > = values in this interval not displayed. Cardiac Enzymes:  Recent Labs  01/17/16 0334 01/17/16 0840 01/17/16 1430  TROPONINI 0.63* 1.22* 1.09*   Fasting Lipid Panel:  Recent Labs  01/19/16 0420  TRIG 190*    Current Meds: . ampicillin-sulbactam (UNASYN) IV  3 g Intravenous Q6H  . antiseptic oral rinse  7 mL Mouth Rinse 10 times per day  . artificial tears  1 application Both Eyes 3 times per day  . chlorhexidine gluconate  15 mL Mouth Rinse BID  . famotidine (PEPCID) IV  20 mg  Intravenous Q12H  . heparin  5,000 Units Subcutaneous 3 times per day  . insulin aspart  2-6 Units Subcutaneous 6 times per day  . potassium chloride  10 mEq Intravenous Q1 Hr x 4  . sodium chloride flush  10-40 mL Intracatheter Q12H     ASSESSMENT AND PLAN:  Active Problems:   Cardiac arrest (HCC)   Encounter for central line placement   Encephalopathy acute   Acute respiratory failure (HCC)   40 yo male presented s/p witnessed cardiac arrest with 20 mins of ACLS  Cardiac arrest - s/p 20 mins ACLS, was in PEA initially. EKG showed inferolateral ST depression but repeat EKG shows resolution of that. Troponin peaked at 1.22 likely 2/2 to CPR. Doubt cardiac etiology.  This is most likely 2/2 to drug overdose (opiates vs benzo) causing resp failure leading to cardiac arrest. No prior cardiac hx. ECHO unremarkable. No ischemic changes on EKG. No indication for further cardiac evaluation at this time. We will follow with you.   Acute hypoxic resp failure ? Aspiration pneumonia.- could be the primary cause of cardiac arrest vs the result of it.  - CXR shows b/l basilar infiltrate, worse on right. as started on Unasyn, WBC trended down with this.    AKI - crt up today from yesterday. UOP decreased, may benefit from IVF. Monitor closely.   AMS - CT showed hypoxic brain injury. Remains sedated.   Hyacinth Meeker, MD 01/19/2016 7:45 AM     Patient seen and examined and history reviewed. Agree with above findings and plan. Patient rewarmed. Off Nimbex. On Fentanyl. Still unresponsive. Echo is normal. The issue now will be Neurologic recovery. It appears that his arrest is not primarily cardiac related. No further cardiac evaluation recommended. We will sign off. Please call if needed.  Peter [place], MDFACC 01/19/2016 9:41 AM

## 2016-01-19 NOTE — Progress Notes (Signed)
Called eLink to report magnesium value and patient's heart rate.  MD Arsenio Loader unavailable, spoke with Brynda Rim.  Okey Regal RN to advise MD Arsenio Loader.

## 2016-01-19 NOTE — Progress Notes (Signed)
Wasted 250 mL of Fentanyl in sink, witnessed by Edd Fabian, RN

## 2016-01-19 NOTE — Progress Notes (Signed)
CT called to schedule CT scan.  Spoke with MD Marchelle Gearing, MD Marchelle Gearing advised it is ok to schedule CT after Jonathan Perez completed 2/10 at 1600.   Called CT and was advised to call back on 2/10 to schedule CT scan for 2/10 after 1600.

## 2016-01-20 ENCOUNTER — Inpatient Hospital Stay (HOSPITAL_COMMUNITY): Payer: Medicare Other

## 2016-01-20 LAB — BASIC METABOLIC PANEL
Anion gap: 10 (ref 5–15)
BUN: 23 mg/dL — ABNORMAL HIGH (ref 6–20)
CHLORIDE: 115 mmol/L — AB (ref 101–111)
CO2: 23 mmol/L (ref 22–32)
CREATININE: 1.31 mg/dL — AB (ref 0.61–1.24)
Calcium: 8.1 mg/dL — ABNORMAL LOW (ref 8.9–10.3)
GFR calc non Af Amer: >60 mL/min (ref >60)
Glucose, Bld: 96 mg/dL (ref 65–99)
POTASSIUM: 3.8 mmol/L (ref 3.5–5.1)
Sodium: 148 mmol/L — ABNORMAL HIGH (ref 135–145)

## 2016-01-20 LAB — GLUCOSE, CAPILLARY
Glucose-Capillary: 91 mg/dL (ref 65–99)
Glucose-Capillary: 90 mg/dL (ref 65–99)

## 2016-01-20 MED ORDER — MORPHINE BOLUS VIA INFUSION
5.0000 mg | INTRAVENOUS | Status: DC | PRN
  Administered 2016-01-20: 15 mg via INTRAVENOUS
  Administered 2016-01-20: 12.5 mg via INTRAVENOUS
  Administered 2016-01-20: 10 mg via INTRAVENOUS
  Filled 2016-01-20 (×4): qty 20

## 2016-01-20 MED ORDER — MORPHINE SULFATE 25 MG/ML IV SOLN
10.0000 mg/h | INTRAVENOUS | Status: DC
  Administered 2016-01-20: 10 mg/h via INTRAVENOUS
  Administered 2016-01-20: 18.8 mg/h via INTRAVENOUS
  Filled 2016-01-20 (×2): qty 10

## 2016-01-20 NOTE — Procedures (Signed)
Extubation Procedure Note  Patient Details:   Name: Jonathan Perez DOB: Apr 02, 1976 MRN: [id number]   Airway Documentation:     Evaluation  O2 sats: stable throughout Complications: No apparent complications Patient did tolerate procedure well. Bilateral Breath Sounds: Rhonchi Suctioning: Airway No   Terminal extubation.  Jonathan Perez 01/20/2016, 1:19 PM

## 2016-01-20 NOTE — Progress Notes (Signed)
Spoke with MD Byrum regarding patient's blood pressure as SBP in the 180's and PRN medications have been discontinued.  RN unsure if brain testing to be completed and if morphine should be started at this time pending brain death testing.  MD Byrum advised to start morphine.

## 2016-01-20 NOTE — Progress Notes (Signed)
PULMONARY / CRITICAL CARE MEDICINE   Name: Jonathan Perez MRN: [id number] DOB: 09-Aug-1976    ADMISSION DATE:  02/08/16 CONSULTATION DATE:  08-Feb-2016  REFERRING MD:  Marijean Niemann, MD  CHIEF COMPLAINT: Cardiac Arrest  HISTORY OF PRESENT ILLNESS:   Jonathan Perez is a 40 y.o. male with PMH significant for HTN, bipolar, chronic pain, multiple prior suicide attempts who presented to the ED by EMS after witnessed cardiac arrest. Patient checked himself into a hotel and about 5 minutes later walked out and collapsed. Patient received approxiametly 20 minutes of CPR with ROSC. Initially with PEA. Was given Narcan with no response and 5 epi. I/O placed in field. In ED patient was intubated and started on NE. Patient with known suicidal attempts by overdose with last attempt 10/2015. Unknown substance currently.    SUBJECTIVE:  Off all sedation as of early am 2/9.  Unresponsive    VITAL SIGNS: BP 121/88 mmHg  Pulse 102  Temp(Src) 98.6 F (37 C) (Core (Comment))  Resp 15  Ht  (1.727 m)  Wt 99.71 kg (219 lb 13.1 oz)  BMI 33.43 kg/m2  SpO2 100%  HEMODYNAMICS:    VENTILATOR SETTINGS: Vent Mode:  [-] PRVC FiO2 (%):  [60 %] 60 % Set Rate:  [18 bmp] 18 bmp Vt Set:  [590 mL] 590 mL PEEP:  [5 cmH20] 5 cmH20 Plateau Pressure:  [16 cmH20-25 cmH20] 16 cmH20  INTAKE / OUTPUT: I/O last 3 completed shifts: In: 1746.7 [I.V.:646.7; IV Piggyback:1100] Out: 1680 [Urine:1380; Emesis/NG output:300]  PHYSICAL EXAMINATION: General:  Unresponsive, intubated Neuro: no corneal reflex, absent reflexes, negative doll's eye HEENT: 1-56mm pupils Cardiovascular: Tachycardia. Regular rhythm  Lungs: CTAB  Abdomen:  Soft, no masses  Musculoskeletal:   Skin:  Warm and dry, intact.   LABS:  BMET  Recent Labs Lab 01/19/16 0420 01/19/16 1700 01/20/16 0345  NA 145 145 148*  K 2.9* 3.8 3.8  CL 114* 114* 115*  CO2 17* 22 23  BUN 18 23* 23*  CREATININE 1.61* 1.48* 1.31*  GLUCOSE 105* 96 96     Electrolytes  Recent Labs Lab 01/17/16 0025 01/17/16 0408  01/19/16 0420 01/19/16 1700 01/20/16 0345  CALCIUM 7.8*  --   < > 7.7* 7.9* 8.1*  MG 1.6* 1.7  --   --  1.4*  --   PHOS  --  3.9  --   --   --   --   < > = values in this interval not displayed.  CBC  Recent Labs Lab 01/17/16 0830 01/17/16 1007 01/18/16 0355 01/19/16 0420  WBC 32.9*  --  17.8* 3.7*  HGB 17.2* 18.0* 15.5 15.7  HCT 49.7 53.0* 45.9 47.4  PLT 283  --  242 179    Coag's  Recent Labs Lab 01/17/16 0025 01/17/16 0830  APTT 30 30  INR 1.10 1.12    Sepsis Markers  Recent Labs Lab 2016-02-08 2001 01/17/16 0025 01/17/16 0341 01/17/16 0408 01/18/16 0355  LATICACIDVEN 13.61* 2.7* 2.7*  --   --   PROCALCITON  --  2.58  --  5.66 9.78    ABG  Recent Labs Lab 01/17/16 0202 01/17/16 0414 01/19/16 0402  PHART 7.217* 7.326* 7.322*  PCO2ART 47.5* 37.4 33.9*  PO2ART 202.0* 78.0* 95.3    Liver Enzymes  Recent Labs Lab 2016-02-08 1950  AST 133*  ALT 126*  ALKPHOS 95  BILITOT 0.4  ALBUMIN 3.2*    Cardiac Enzymes  Recent Labs Lab 01/17/16 0334 01/17/16 0840  01/17/16 1430  TROPONINI 0.63* 1.22* 1.09*    Glucose  Recent Labs Lab 01/19/16 1145 01/19/16 1546 01/19/16 2102 01/19/16 2347 01/20/16 0344 01/20/16 0833  GLUCAP 94 80 93 89 91 90    Imaging Dg Chest Port 1 View  01/20/2016  CLINICAL DATA:  Acute encephalopathy. Airspace opacities in the lungs. Respiratory failure. EXAM: PORTABLE CHEST 1 VIEW COMPARISON:  01/19/2016 FINDINGS: Endotracheal tube tip 4.8 cm above the carina. Nasogastric tube enters the stomach. Thoracolumbar spine hardware unchanged. Right IJ line tip: SVC. Improved aeration in both lung bases although considerable airspace opacity persists in the right parahilar in right medial basilar region. Mild central airway thickening is present. IMPRESSION: 1. Improved aeration in both lungs although airspace opacity persists particularly on the right side,  and there is some persistent airway thickening. Electronically Signed   By: Gaylyn Rong M.D.   On: 01/20/2016 06:57    STUDIES:  CT head 2/6>>> loss of gray/white interface, concern for ischemic injury and edema  CULTURES: Blood 2/6>> Urine 2/6>>neg  ANTIBIOTICS: N/A  SIGNIFICANT EVENTS:   LINES/TUBES: ETT PIVs  DISCUSSION: 40 y.o. male with multiple prior suicide attempts, resp and then cardiac arrest. Intubated and supported w therapeutic hypothermia. Head Ct concerning for ischemic injury  ASSESSMENT / PLAN:  PULMONARY A: Acute Hypoxic Respiratory failure 2/2 Cardiac Arrest Sleep Apnea At risk for aspiration  P:   Full vent support currently, suspect we will withdraw care based on family discussion below Albuterol prn Given increase in secretions and sputum and Bibasilar infiltrates started abx on 2/8  CARDIOVASCULAR A:  Cardiac arrest - PEA due to presumed overdose/suicide attempt (multiple prior attempts) with 20 minutes of ACLS prior to ROSC. TTE with intact LV fxn H/o HTN + cocaine P:  Telemtry  Norepi off Lactate has cleared 2/7 Appreciate Cardiology assist  RENAL Lab Results  Component Value Date   CREATININE 1.31* 01/20/2016   CREATININE 1.48* 01/19/2016   CREATININE 1.61* 01/19/2016    A:   Acute on Chronic Kidney Disease - Cr 2.09 on admission Hypokalemia - 3.3 on admission, resolved Hypocalcemia Anion gap metabolic acidosis - Lactic acid 13.6, AG 25; resolved  P:   Monitor electrolytes and UOP   GASTROINTESTINAL A:   No acute issues  P:   PPI SCDs NPO IVF Pepcid   HEMATOLOGIC A:   No acute issues  P:  Follow CBC, coags   INFECTIOUS A:   No acute issues Suspected aspiration PNA Leukocytosis - but in setting of ACLS  P:   Pan culture, follow Trend WBC Started unasyn 2/8 for suspected aspiration PNA  ENDOCRINE CBG (last 3)   Recent Labs  01/19/16 2347 01/20/16 0344 01/20/16 0833  GLUCAP 89 91 90      A:   No acute issues   Anticipate hypoglycemia due to cooling  P:   Monitor Follow CBG  NEUROLOGIC A:   Acute encephalopathy in setting of cardiac arrest Probable Anoxic brain injury Presumed suicide attempt by overdose (multiple prior attempts including IVC to behavorial health in Oct) - unknown substance - did receive narcan by EMS with no response. ASA and APAP levels negative Chronic pain on methadone Cocaine positive P:   RASS goal: -1 Therapeutic cooling completed Unfortunately exam is consistent with a severe anoxic injury. Discussed with his family at bedside, informed them that he would not recover neurologically. They had prepared themselves for this news. They agree that he would not want or benefit from continued MV support.  I will start morphine and we will withdraw care once this is in place. Will support family to best of our ability.      FAMILY  - Updates: RB spoke with family at bedside 2/7 and 2/8, 2/10 as above  - Inter-disciplinary family meet or Palliative Care meeting due by:  01/23/16   Independent critical care time is 35 minutes.   Levy Pupa, MD, PhD 01/20/2016, 10:26 AM [facility] [phone] or if no answer [phone]

## 2016-01-20 NOTE — Progress Notes (Signed)
Chaplain presented to the patient's room to provide spiritual care support for the family of the patient who is currently in withdrawal of life sustaining measures. The brother was present at the time of this visit and Chaplain provided ministry of presence, and listened emphatically as he shared some of the patient's life challenges. Chaplain offered a prayer of peace for the family as they are present to say their final good byes to their loved one. Chaplain will continue to follow up for support as needed.  Chaplain Janell Quiet [phone]

## 2016-01-20 NOTE — Progress Notes (Signed)
Spoke with MD Byrum to clarify morphine order.  Patient currently having increased work of breathing, gasping for air, and snoring sounds.  Morphine currently at 18.8 mg/hr.  MD Byrum advised this would be titration limit and to not increase further.

## 2016-01-20 NOTE — Progress Notes (Signed)
Rapid shallow index of breathing requested by [facility]. 87.6
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
## 2016-02-08 DEATH — deceased

## 2016-03-10 NOTE — Discharge Summary (Signed)
PULMONARY / CRITICAL CARE MEDICINE   Name: Jonathan Perez MRN: [id number] DOB: [birth year]    ADMISSION DATE:  February 05, 2016 Date of death:02-10-16  Final cause of death: Anoxic brain injury with encephalopathy  Secondary causes of death: Acute respiratory arrest Acute cardiac arrest due to respiratory suppression Intentional toxic ingestion, overdose Severe depression with suicide attempt bipolar disorder Cardiogenic and septic shock Aspiration pneumonia Acute metabolic acidosis Lactic acidosis Acute on chronic renal insufficiency/acute on chronic renal failure Cocaine abuse History of hypertension Hypercalcemia Hypokalemia History of chronic pain syndrome History  of tobacco use History of renal calculi History of sleep apnea    Hospital course:   [name] is a 40 y.o. male with PMH significant for HTN, bipolar, chronic pain, multiple prior suicide attempts who presented to the ED by EMS after witnessed cardiac arrest. Patient checked himself into a hotel and about 5 minutes later walked out and collapsed. Patient received approxiametly 20 minutes of CPR with ROSC. Initially with PEA. Was given Narcan with no response and 5 epi. I/O placed in field. In ED patient was intubated and started on NE. Patient with known suicidal attempts by overdose with last attempt 10/2015. Urine drug screen was significant for cocaine,opiates, benzodiazepines.He was treated empirically with Unasyn for presumed aspiration pneumonia Norepinephrine was titrated for septic and cardiogenic shock. He underwent therapeutic hypothermia for neurologic protection. Blood pressure was supported with some improvement in acute renal failure and an acute lactic acidosis. Following rewarming he remained comatose even off sedation. CT scan of the head on 2/6 showed loss of gray-white junction consistent with severe ischemic injury. Given his failure to improve neurologically and his poor prognosis for  neurological recovery discussions were undertaken with this patient's family. Decision was made to pursue comfort care and he was extubated on 10-16-2023. He died on Feb 10, 2016.   STUDIES:  CT head 2/6>>> loss of gray/white interface, concern for ischemic injury and edema    Levy Pupa, MD, PhD 02/09/2016, 4:32 PM [facility] [phone] or if no answer [phone]
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
## 2016-12-11 IMAGING — CR DG CHEST 1V PORT
1 series · 1 of 1 positions shown · non-contrast
Comparison: 01/19/2016

CLINICAL DATA: Acute encephalopathy. Airspace opacities in the
lungs. Respiratory failure.

EXAM:
PORTABLE CHEST 1 VIEW

[AP]
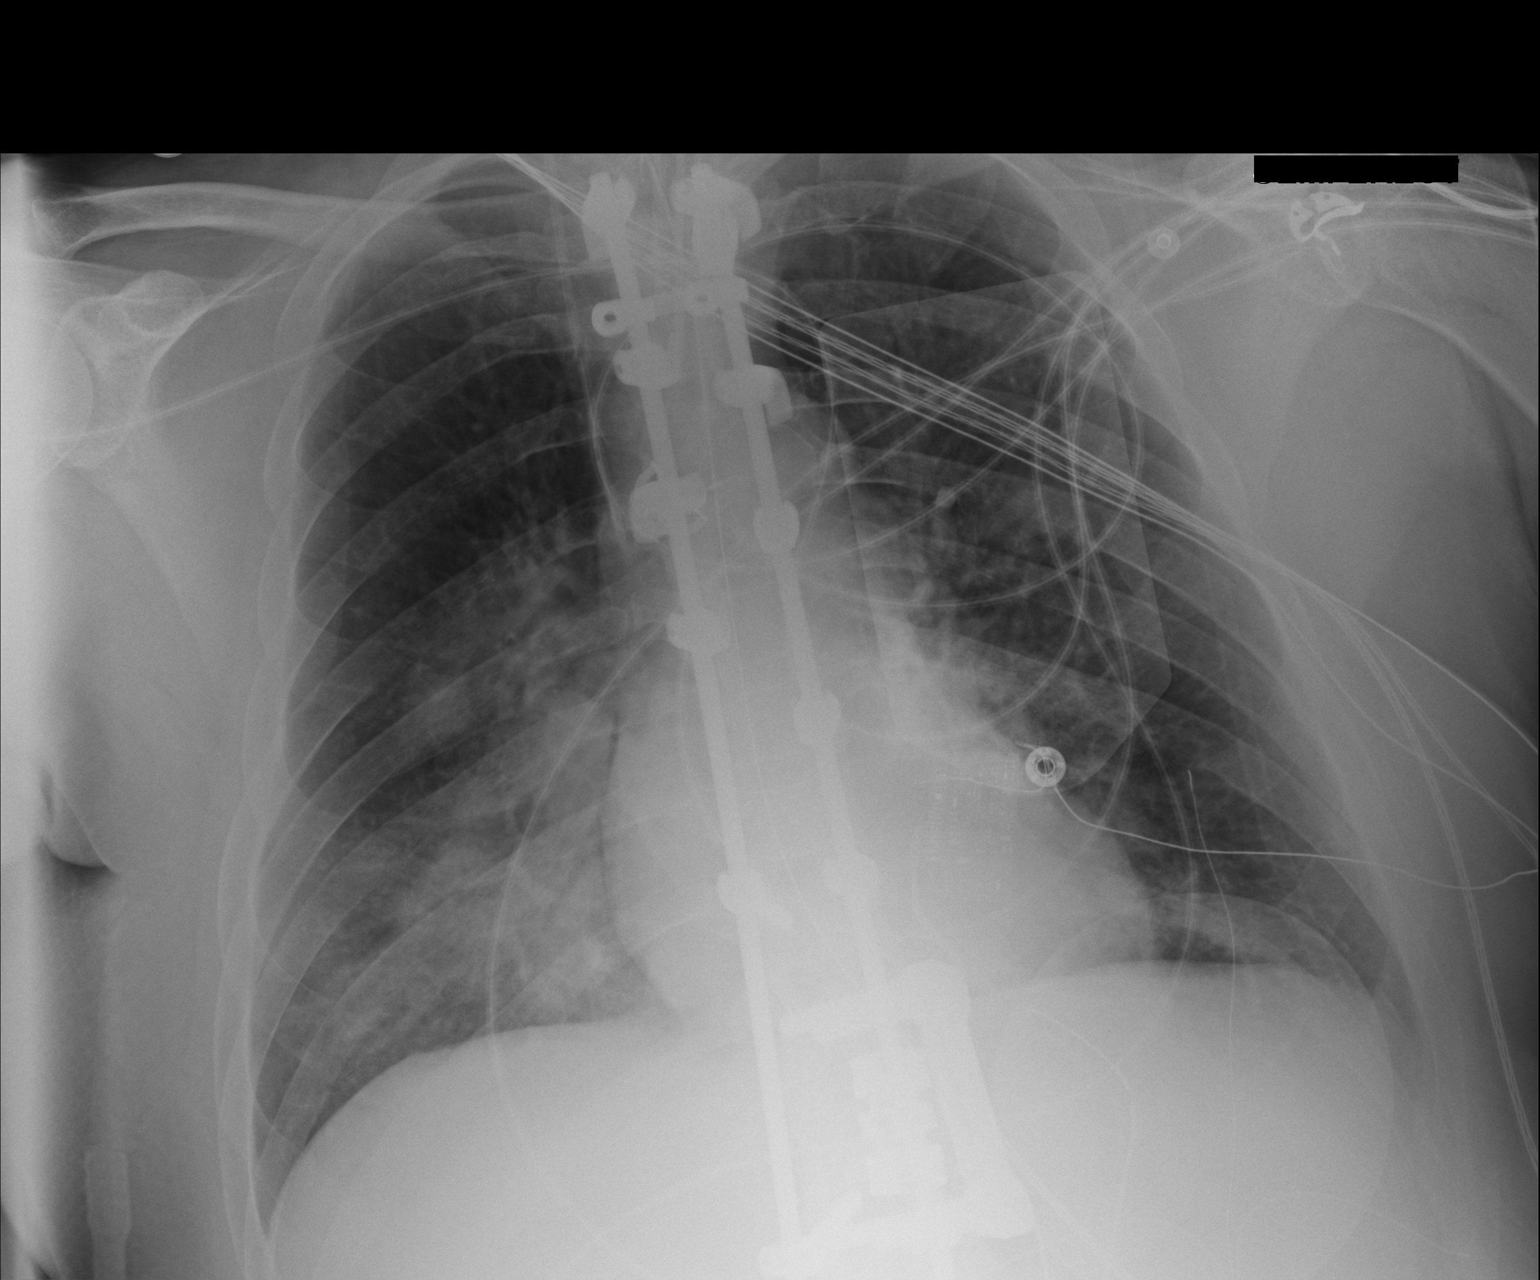

[1 of 1 positions shown; findings below may reference images not displayed]

FINDINGS: Endotracheal tube tip 4.8 cm above the carina. Nasogastric tube
enters the stomach. Thoracolumbar spine hardware unchanged. Right IJ
line tip: SVC.

Improved aeration in both lung bases although considerable airspace
opacity persists in the right parahilar in right medial basilar
region. Mild central airway thickening is present.
IMPRESSION: 1. Improved aeration in both lungs although airspace opacity
persists particularly on the right side, and there is some
persistent airway thickening.
